# Patient Record
Sex: Female | Born: 1955 | Race: Black or African American | Hispanic: No | Marital: Married | Smoking: Light tobacco smoker
Health system: Southern US, Community
[De-identification: ages and names within clinical notes are randomized; demographics above are authoritative.]

## PROBLEM LIST (undated history)

## (undated) DIAGNOSIS — E559 Vitamin D deficiency, unspecified: Secondary | ICD-10-CM

## (undated) DIAGNOSIS — C801 Malignant (primary) neoplasm, unspecified: Secondary | ICD-10-CM

## (undated) DIAGNOSIS — K219 Gastro-esophageal reflux disease without esophagitis: Secondary | ICD-10-CM

## (undated) DIAGNOSIS — G43909 Migraine, unspecified, not intractable, without status migrainosus: Secondary | ICD-10-CM

## (undated) DIAGNOSIS — F419 Anxiety disorder, unspecified: Secondary | ICD-10-CM

## (undated) DIAGNOSIS — E785 Hyperlipidemia, unspecified: Secondary | ICD-10-CM

## (undated) DIAGNOSIS — H47333 Pseudopapilledema of optic disc, bilateral: Secondary | ICD-10-CM

## (undated) DIAGNOSIS — I10 Essential (primary) hypertension: Secondary | ICD-10-CM

## (undated) DIAGNOSIS — Z85850 Personal history of malignant neoplasm of thyroid: Secondary | ICD-10-CM

## (undated) HISTORY — DX: Essential (primary) hypertension: I10

## (undated) HISTORY — DX: Gastro-esophageal reflux disease without esophagitis: K21.9

## (undated) HISTORY — DX: Personal history of malignant neoplasm of thyroid: Z85.850

## (undated) HISTORY — DX: Vitamin D deficiency, unspecified: E55.9

## (undated) HISTORY — DX: Hyperlipidemia, unspecified: E78.5

## (undated) HISTORY — DX: Pseudopapilledema of optic disc, bilateral: H47.333

## (undated) HISTORY — DX: Migraine, unspecified, not intractable, without status migrainosus: G43.909

## (undated) HISTORY — DX: Anxiety disorder, unspecified: F41.9

## (undated) HISTORY — DX: Malignant (primary) neoplasm, unspecified: C80.1

---

## 1998-11-07 ENCOUNTER — Encounter: Payer: Self-pay | Admitting: Internal Medicine

## 1998-11-07 ENCOUNTER — Ambulatory Visit (HOSPITAL_COMMUNITY): Admission: RE | Admit: 1998-11-07 | Discharge: 1998-11-07 | Payer: Self-pay | Admitting: Internal Medicine

## 1998-12-18 ENCOUNTER — Other Ambulatory Visit: Admission: RE | Admit: 1998-12-18 | Discharge: 1998-12-18 | Payer: Self-pay | Admitting: Gynecology

## 1999-11-27 ENCOUNTER — Ambulatory Visit (HOSPITAL_COMMUNITY): Admission: RE | Admit: 1999-11-27 | Discharge: 1999-11-27 | Payer: Self-pay | Admitting: Internal Medicine

## 1999-11-27 ENCOUNTER — Encounter: Payer: Self-pay | Admitting: Internal Medicine

## 1999-11-30 ENCOUNTER — Other Ambulatory Visit: Admission: RE | Admit: 1999-11-30 | Discharge: 1999-11-30 | Payer: Self-pay | Admitting: Gynecology

## 2000-11-18 ENCOUNTER — Ambulatory Visit (HOSPITAL_COMMUNITY): Admission: RE | Admit: 2000-11-18 | Discharge: 2000-11-18 | Payer: Self-pay | Admitting: Internal Medicine

## 2000-12-23 ENCOUNTER — Ambulatory Visit (HOSPITAL_COMMUNITY): Admission: RE | Admit: 2000-12-23 | Discharge: 2000-12-23 | Payer: Self-pay | Admitting: Internal Medicine

## 2000-12-23 ENCOUNTER — Encounter: Payer: Self-pay | Admitting: Internal Medicine

## 2001-02-09 ENCOUNTER — Other Ambulatory Visit: Admission: RE | Admit: 2001-02-09 | Discharge: 2001-02-09 | Payer: Self-pay | Admitting: Gynecology

## 2001-12-29 ENCOUNTER — Encounter: Payer: Self-pay | Admitting: Gynecology

## 2001-12-29 ENCOUNTER — Ambulatory Visit (HOSPITAL_COMMUNITY): Admission: RE | Admit: 2001-12-29 | Discharge: 2001-12-29 | Payer: Self-pay | Admitting: Gynecology

## 2002-01-19 ENCOUNTER — Other Ambulatory Visit: Admission: RE | Admit: 2002-01-19 | Discharge: 2002-01-19 | Payer: Self-pay | Admitting: Gynecology

## 2003-01-05 ENCOUNTER — Ambulatory Visit (HOSPITAL_COMMUNITY): Admission: RE | Admit: 2003-01-05 | Discharge: 2003-01-05 | Payer: Self-pay | Admitting: Gynecology

## 2003-01-05 ENCOUNTER — Encounter: Payer: Self-pay | Admitting: Gynecology

## 2003-02-08 ENCOUNTER — Other Ambulatory Visit: Admission: RE | Admit: 2003-02-08 | Discharge: 2003-02-08 | Payer: Self-pay | Admitting: Gynecology

## 2003-03-28 ENCOUNTER — Ambulatory Visit: Admission: RE | Admit: 2003-03-28 | Discharge: 2003-03-28 | Payer: Self-pay | Admitting: Internal Medicine

## 2003-03-28 ENCOUNTER — Encounter: Payer: Self-pay | Admitting: Internal Medicine

## 2003-07-16 HISTORY — PX: ABDOMINAL HYSTERECTOMY: SHX81

## 2004-01-03 ENCOUNTER — Inpatient Hospital Stay (HOSPITAL_COMMUNITY): Admission: RE | Admit: 2004-01-03 | Discharge: 2004-01-05 | Payer: Self-pay | Admitting: Gynecology

## 2004-01-03 ENCOUNTER — Encounter (INDEPENDENT_AMBULATORY_CARE_PROVIDER_SITE_OTHER): Payer: Self-pay | Admitting: Specialist

## 2004-06-06 ENCOUNTER — Ambulatory Visit (HOSPITAL_COMMUNITY): Admission: RE | Admit: 2004-06-06 | Discharge: 2004-06-06 | Payer: Self-pay | Admitting: Internal Medicine

## 2004-08-22 ENCOUNTER — Other Ambulatory Visit: Admission: RE | Admit: 2004-08-22 | Discharge: 2004-08-22 | Payer: Self-pay | Admitting: Gynecology

## 2005-08-06 ENCOUNTER — Ambulatory Visit (HOSPITAL_COMMUNITY): Admission: RE | Admit: 2005-08-06 | Discharge: 2005-08-06 | Payer: Self-pay | Admitting: Gynecology

## 2005-09-18 ENCOUNTER — Other Ambulatory Visit: Admission: RE | Admit: 2005-09-18 | Discharge: 2005-09-18 | Payer: Self-pay | Admitting: Gynecology

## 2006-09-01 ENCOUNTER — Ambulatory Visit (HOSPITAL_COMMUNITY): Admission: RE | Admit: 2006-09-01 | Discharge: 2006-09-01 | Payer: Self-pay | Admitting: Gynecology

## 2006-09-17 ENCOUNTER — Encounter: Admission: RE | Admit: 2006-09-17 | Discharge: 2006-09-17 | Payer: Self-pay | Admitting: Gynecology

## 2006-09-24 ENCOUNTER — Other Ambulatory Visit: Admission: RE | Admit: 2006-09-24 | Discharge: 2006-09-24 | Payer: Self-pay | Admitting: Gynecology

## 2007-08-21 ENCOUNTER — Ambulatory Visit (HOSPITAL_COMMUNITY): Admission: RE | Admit: 2007-08-21 | Discharge: 2007-08-21 | Payer: Self-pay | Admitting: Gastroenterology

## 2007-08-21 LAB — HM COLONOSCOPY

## 2007-10-14 ENCOUNTER — Encounter: Admission: RE | Admit: 2007-10-14 | Discharge: 2007-10-14 | Payer: Self-pay | Admitting: Gynecology

## 2008-03-02 ENCOUNTER — Emergency Department (HOSPITAL_COMMUNITY): Admission: EM | Admit: 2008-03-02 | Discharge: 2008-03-02 | Payer: Self-pay | Admitting: Family Medicine

## 2008-12-21 ENCOUNTER — Encounter: Admission: RE | Admit: 2008-12-21 | Discharge: 2008-12-21 | Payer: Self-pay | Admitting: Internal Medicine

## 2008-12-27 ENCOUNTER — Encounter: Admission: RE | Admit: 2008-12-27 | Discharge: 2008-12-27 | Payer: Self-pay | Admitting: Internal Medicine

## 2009-05-31 ENCOUNTER — Ambulatory Visit (HOSPITAL_COMMUNITY): Admission: RE | Admit: 2009-05-31 | Discharge: 2009-05-31 | Payer: Self-pay | Admitting: Internal Medicine

## 2009-12-22 ENCOUNTER — Encounter: Admission: RE | Admit: 2009-12-22 | Discharge: 2009-12-22 | Payer: Self-pay | Admitting: Internal Medicine

## 2010-02-03 ENCOUNTER — Emergency Department (HOSPITAL_COMMUNITY): Admission: EM | Admit: 2010-02-03 | Discharge: 2010-02-03 | Payer: Self-pay | Admitting: Family Medicine

## 2010-08-05 ENCOUNTER — Encounter: Payer: Self-pay | Admitting: Gynecology

## 2010-08-06 ENCOUNTER — Encounter: Payer: Self-pay | Admitting: Internal Medicine

## 2010-08-27 ENCOUNTER — Other Ambulatory Visit (HOSPITAL_COMMUNITY): Payer: Self-pay | Admitting: Internal Medicine

## 2010-08-27 DIAGNOSIS — E049 Nontoxic goiter, unspecified: Secondary | ICD-10-CM

## 2010-08-30 ENCOUNTER — Other Ambulatory Visit (HOSPITAL_COMMUNITY): Payer: Self-pay | Admitting: Internal Medicine

## 2010-08-30 ENCOUNTER — Ambulatory Visit (HOSPITAL_COMMUNITY)
Admission: RE | Admit: 2010-08-30 | Discharge: 2010-08-30 | Disposition: A | Discharge status: Home / Self Care | Payer: Commercial Managed Care - PPO | Source: Ambulatory Visit | Attending: Internal Medicine | Admitting: Internal Medicine

## 2010-08-30 DIAGNOSIS — E049 Nontoxic goiter, unspecified: Secondary | ICD-10-CM

## 2010-08-30 DIAGNOSIS — E042 Nontoxic multinodular goiter: Secondary | ICD-10-CM | POA: Insufficient documentation

## 2010-09-05 ENCOUNTER — Other Ambulatory Visit (HOSPITAL_COMMUNITY): Payer: Self-pay | Admitting: Internal Medicine

## 2010-09-05 DIAGNOSIS — E041 Nontoxic single thyroid nodule: Secondary | ICD-10-CM

## 2010-09-10 ENCOUNTER — Ambulatory Visit (HOSPITAL_COMMUNITY)
Admission: RE | Admit: 2010-09-10 | Discharge: 2010-09-10 | Disposition: A | Discharge status: Home / Self Care | Payer: 59 | Source: Ambulatory Visit | Attending: Internal Medicine | Admitting: Internal Medicine

## 2010-09-10 ENCOUNTER — Other Ambulatory Visit: Payer: Self-pay | Admitting: Interventional Radiology

## 2010-09-10 DIAGNOSIS — E039 Hypothyroidism, unspecified: Secondary | ICD-10-CM | POA: Insufficient documentation

## 2010-09-10 DIAGNOSIS — E042 Nontoxic multinodular goiter: Secondary | ICD-10-CM | POA: Insufficient documentation

## 2010-09-10 DIAGNOSIS — E041 Nontoxic single thyroid nodule: Secondary | ICD-10-CM

## 2010-09-11 ENCOUNTER — Other Ambulatory Visit (HOSPITAL_COMMUNITY): Payer: Self-pay | Admitting: Orthopedic Surgery

## 2010-09-11 DIAGNOSIS — M25561 Pain in right knee: Secondary | ICD-10-CM

## 2010-09-18 ENCOUNTER — Ambulatory Visit (HOSPITAL_COMMUNITY)
Admission: RE | Admit: 2010-09-18 | Discharge: 2010-09-18 | Disposition: A | Discharge status: Home / Self Care | Payer: 59 | Source: Ambulatory Visit | Attending: Orthopedic Surgery | Admitting: Orthopedic Surgery

## 2010-09-18 DIAGNOSIS — M959 Acquired deformity of musculoskeletal system, unspecified: Secondary | ICD-10-CM | POA: Insufficient documentation

## 2010-09-18 DIAGNOSIS — M25561 Pain in right knee: Secondary | ICD-10-CM

## 2010-09-18 DIAGNOSIS — M25569 Pain in unspecified knee: Secondary | ICD-10-CM | POA: Insufficient documentation

## 2010-09-18 DIAGNOSIS — M7989 Other specified soft tissue disorders: Secondary | ICD-10-CM | POA: Insufficient documentation

## 2010-09-29 LAB — POCT URINALYSIS DIP (DEVICE)
Bilirubin Urine: NEGATIVE
Glucose, UA: NEGATIVE mg/dL
Ketones, ur: NEGATIVE mg/dL
Nitrite: NEGATIVE
Specific Gravity, Urine: 1.01 (ref 1.005–1.030)

## 2010-09-29 LAB — URINE CULTURE: Colony Count: 8000

## 2010-10-08 ENCOUNTER — Other Ambulatory Visit: Payer: Self-pay | Admitting: Gynecology

## 2010-10-24 ENCOUNTER — Other Ambulatory Visit: Payer: Self-pay | Admitting: Surgery

## 2010-10-24 DIAGNOSIS — E041 Nontoxic single thyroid nodule: Secondary | ICD-10-CM

## 2010-11-27 NOTE — Op Note (Signed)
NAME:  Tammy Duran, Tammy Duran NO.:  [id number]   MEDICAL RECORD NO.:  [id number]          PATIENT TYPE:  AMB   LOCATION:  ENDO                         FACILITY:  [facility]   PHYSICIAN:  Anselmo Rod, M.D.  DATE OF BIRTH:  01-Oct-1955   DATE OF PROCEDURE:  08/21/2007  DATE OF DISCHARGE:                               OPERATIVE REPORT   PROCEDURE PERFORMED:  Screening colonoscopy.   ENDOSCOPIST:  Anselmo Rod, M.D.   INSTRUMENT USED:  Pentax video colonoscope.   INDICATIONS FOR PROCEDURE:  A 55 year old, African-American female  undergoing a screening colonoscopy to rule out colonic polyps, masses,  etc.   PREPROCEDURE PREPARATION:  Informed consent was procured from the  patient.  The patient fasted for 4 hours prior to the procedure and  prepped with 20 OsmoPrep pills the night of and 12 OsmoPrep pills the  morning of the procedure. The risks and benefits of the procedure  including a 10% miss rate of cancer and polyp were discussed with the  patient as well.   PREPROCEDURE PHYSICAL:  The patient had stable vital signs.  Neck  supple.  Chest clear to auscultation.  S1-S2 regular.  Abdomen soft with  normal bowel sounds.   DESCRIPTION OF PROCEDURE:  The patient was placed in the left lateral  decubitus position, sedated with 580 mcg of Fentanyl and 4 mg of Versed  given intravenously in slow incremental doses.  Once the patient was  adequately sedated and maintained on low-flow oxygen and continuous  cardiac monitoring the Pentax video colonoscope was advanced from the  rectum to the cecum.  The appendiceal orifice and ileocecal valve were  clearly visualized and photographed.  The terminal ileum appeared  healthy and without lesions. No masses, polyps, erosions, ulcerations or  diverticula seen.  Retroflexion in the rectum revealed no abnormalities.  The patient tolerated the procedure well without immediate  complications.   IMPRESSION:  Normal colonoscopy up  to the terminal ileum.  No masses,  polyps, erosions, ulcerations or diverticula noted.   RECOMMENDATIONS:  1. A repeat colonoscopy has been recommended in the next 10 years. If      the patient has any abnormal symptoms in the interim, she should      contact the office immediately for further recommendations.  2. Continue high fiber diet with liberal fluid intake.  3. Yearly Hemoccult testing.  4. Outpatient follow-up as need arises in the future.      Anselmo Rod, M.D.  Electronically Signed     JNM/MEDQ  D:  08/21/2007  T:  08/22/2007  Job:  [id number]   cc:   Lucky Cowboy, M.D.  Fax: [phone]

## 2010-11-30 NOTE — Discharge Summary (Signed)
NAME:  Tammy Duran, Tammy Duran                           ACCOUNT NO.:  [id number]   MEDICAL RECORD NO.:  [id number]                   PATIENT TYPE:  INP   LOCATION:  9309                                 FACILITY:  [facility]   PHYSICIAN:  Gretta Cool, M.D.              DATE OF BIRTH:  [birth year]   DATE OF ADMISSION:  01/03/2004  DATE OF DISCHARGE:  01/05/2004                                 DISCHARGE SUMMARY   HISTORY OF PRESENT ILLNESS:  Ms. Son is a 55 year old female, gravida 3,  AB 2, para 1, with a history of uterine leiomyomata and abnormal uterine  bleeding for greater than 6 months.  She has heavy flow with passing of  large clots, backache, and difficulty with bladder compression, nocturia,  frequency, and urgency.  Ultrasound documented fibroids, and they are not  accessible by hysteroscopy.  She is now admitted for definitive therapy by  supracervical hysterectomy, possible total abdominal hysterectomy, possible  salpingo-oophorectomy under general anesthesia.  She wishes ovarian  conservation if possible.  It is also noted that she has mitral valve  prolapse and required dental and GYN prophylaxis.   ADMISSION EXAMINATION:  CHEST:  Clear to auscultation and percussion.  HEART:  Rate and rhythm were regular without murmur, gallop, or cardiac  enlargement.  ABDOMEN:  Soft and scaphoid without masses or organomegaly.  Uterus is  palpable above the symphysis pubis.  PELVIC:  External genitalia within normal limits for female.  Vagina clean  and rugose.  Cervix is parous and well supported.  The uterus is  approximately 14 weeks in size.  Adnexa non-palpable.  RECTOVAGINAL EXAM:  Confirms.   IMPRESSION:  1. Uterine leiomyomata with abnormal uterine bleeding and pelvic pressure.  2. Mitral valve prolapse by history requiring prophylaxis.  3. Hypertension, well controlled.  4. Anxiety.   PLAN:  Definitive therapy as mentioned above.  Risks and benefits were  discussed with the  patient, and she accepts these procedures.   LABORATORY DATA:  Admission hemoglobin 13.8, hematocrit 41.9, white count of  6.8.  On the first postoperative day, hemoglobin was 12.8, hematocrit 38.6.  The remainder of her preoperative laboratory work was within normal limits.  Urine pregnancy test was negative.  EKG revealed normal sinus rhythm.  Normal EKG.   HOSPITAL COURSE:  The patient underwent supracervical hysterectomy under  general anesthesia.  The patient was given prophylactic antibiotics for  mitral valve prolapse prior to surgery.  Examination of the pelvis revealed  fibroids grossly distorting the uterus.  The ovaries appeared to be  relatively inactive, but the patient desires conservation.  There is no  evidence of endometriosis or pathology that contraindicated leaving the  ovaries in place.  The procedure was completed without any complications,  and the patient was returned to the recovery room in excellent condition.   Her postoperative course was without complications, and she was discharged  on the  second postoperative day in excellent condition.   FINAL DISCHARGE INSTRUCTIONS:  1. No heavy lifting or straining.  2. No vaginal entrance.  3. Increase ambulation as tolerated.  4. She is to report any fever of over 100.5 or failure of daily improvement.   DIET:  Regular.   MEDICATIONS:  1. Tylox one p.o. q.4-6h. p.r.n. discomfort.  2. She is to return to preoperative medications.   FOLLOW UP:  She is to return to the office in 1 week for follow up.   CONDITION ON DISCHARGE:  Excellent.   FINAL DISCHARGE DIAGNOSIS:  Uterine leiomyomata, transmural, with abnormal  uterine bleeding and pelvic pressures.   PROCEDURES PERFORMED:  Supracervical hysterectomy under general anesthesia.     Matt Holmes, N.P.                          Gretta Cool, M.D.    EMK/MEDQ  D:  01/17/2004  T:  01/18/2004  Job:  [id number]   cc:   Jeoffrey Massed, M.D.  [address]   [place]  [place] [address]  Fax: [phone]

## 2010-11-30 NOTE — H&P (Signed)
NAME:  Tammy Duran, Tammy Duran NO.:  [id number]   MEDICAL RECORD NO.:  [id number]                   PATIENT TYPE:   LOCATION:                                       FACILITY:   PHYSICIAN:  Gretta Cool, M.D.              DATE OF BIRTH:   DATE OF ADMISSION:  DATE OF DISCHARGE:                                HISTORY & PHYSICAL   PREOPERATIVE DIAGNOSES:  Uterine leiomyomata with abnormal uterine bleeding.   HISTORY OF PRESENT ILLNESS:  A 55 year old, G3, P1, AB 2, with a history of  uterine leiomyomata and abnormal uterine bleeding for greater than 6 months'  duration.  She has extremely heavy flow with passage of large clots, back  ache, and also has difficulty with bladder compression, nocturia, frequency,  urgency.  She has had ultrasound documentation and is not amenable to  consistent therapy such as hysteroscopy.  She is now admitted for definitive  therapy by supracervical hysterectomy, possible total abdominal  hysterectomy, possible salpingo-oophorectomy.  She wishes ovarian  conservation if possible, and that is our plan.  She understands the risks,  benefits, and alternative therapies all.   She also has mitral valve prolapse and requires dental and gyn prophylaxis,  so she will have amoxicillin and gentamicin preoperatively.   PAST MEDICAL HISTORY:  Usual childhood disease without sequelae.   MEDICAL ILLNESSES:  1. Blood pressure elevation on Moduretic 5/50 and atenolol 25 mg daily.  2. No other known significant medical concerns other than mitral valve     prolapse.   FAMILY HISTORY:  Father died of suicide.  Mother is 49 with diabetes  mellitus, type 2, heart problems, peripheral vascular disease, hypertension,  and hypercholesterolemia.  One sister is living and well.  One brother, age  90, has hypertension.  No other known familial tendencies.   SOCIAL HISTORY:  The patient is an Charity fundraiser, [employer].  She  is divorced with  one 102 year old child.   REVIEW OF SYSTEMS:  HEENT:  Denies symptoms.  CARDIORESPIRATORY:  Denies  asthma, cough, bronchitis, shortness of breath.  GI/GU:  Denies frequency,  urgency, dysuria, change in bowel habits, food intolerance.   HABITS:  Smokes 1/2 pack per week.  Social ethanol.  Denies other  recreational drugs or other.   PRESENT MEDICATIONS:  1. Moduretic 5/50.  2. Atenolol 25 mg daily.  3. Xanax 0.25 p.r.n. anxiety.  4. Zyrtec D and Flonase for seasonal allergy.   PHYSICAL EXAMINATION:  GENERAL:  Well-developed, well-nourished, black  female.  HEENT:  Pupils equal, reactive to light and accommodate.  Fundi not  examined.  Oropharynx clear.  NECK:  Supple without masses or thyroid enlargement.  HEART:  Regular rhythm without murmur or cardiac enlargement that I can  identify.  BREASTS:  Without mass, nodes, nipple discharge.  ABDOMEN:  Soft, scaphoid, without mass or organomegaly.  Uterus is palpable  above the symphysis.  PELVIC:  External genitalia.  Normal female vagina.  Clean, rugose.  Cervix  is parous, well-supported, does not distend significantly at all.  Her  uterus is approximately 14 weeks size.  Adnexa nonpalpable.  Rectovaginal  exam confirms.   IMPRESSION:  1. Uterine leiomyomata with abnormal uterine bleeding and pelvic pressure.  2. Mitral valve prolapse by history requiring prophylaxis.  3. Hypertension, well-controlled.  4. Anxiety.   PLAN:  On to definitive therapy by supracervical hysterectomy, possible  salpingo-oophorectomy versus TAH/BSO.  Risks, benefit, ratio, alternative  therapies all discussed in detail as above.                                               Gretta Cool, M.D.    CWL/MEDQ  D:  01/02/2004  T:  01/02/2004  Job:  [id number]   cc:   Lucky Cowboy, M.D.  [address]  [place], [place] [address]  Fax: [phone]

## 2010-11-30 NOTE — Op Note (Signed)
NAME:  Tammy Duran, Tammy Duran                           ACCOUNT NO.:  [id number]   MEDICAL RECORD NO.:  [id number]                   PATIENT TYPE:  INP   LOCATION:  9309                                 FACILITY:  [facility]   PHYSICIAN:  Gretta Cool, M.D.              DATE OF BIRTH:  10-04-1955   DATE OF PROCEDURE:  DATE OF DISCHARGE:                                 OPERATIVE REPORT   PREOPERATIVE DIAGNOSIS:  Uterine leiomyomata, transmural with abnormal  uterine bleeding.   POSTOPERATIVE DIAGNOSIS:  Uterine leiomyomata, transmural with abnormal  uterine bleeding.   SURGEON:  Beather Arbour, MD.   ASSISTANT:  Katherine Mantle, MD.   PROCEDURE:  Supracervical hysterectomy.   ANESTHESIA:  General, orotracheal.   DESCRIPTION OF PROCEDURE:  Under excellent general anesthesia with the  patient prepped and draped in the supine position, with Foley catheter  draining her bladder, a Pfannenstiel incision was made and the incision  extended through the fascia.  The rectus muscles were separated in the  midline and peritoneum opened.  The abdomen was explored, there was no  evidence of abnormality in the upper abdomen.  The examination of the pelvis  revealed a fibroid grossly distorted by multiple fibroids.  Both ovaries  appeared to be relatively inactive, but the patient desires conservation.  There was no evidence of endometriosis or other pathology that  contraindicated leaving ovaries in place.  At this point the uterus was  elevated with the [facility] clamps and the round ligaments transected.  The  anterior leaf of the broad ligament was then opened and the ovarian ligament  clamped, cut, sutured and tied.  The pedicle was then doubly ligated with 0  Vicryl.  The uterine vessels were skeletonized, clamped, cut, sutured and  tied with 0 Vicryl.  The cervix was then incised in conical fashion so as to  remove most of the endocervical canal.  The tissue inside the endocervix was  then treated by  cautery.  The cervix was then closed with a running mattress  closure of 0 Vicryl.  At the end of the procedure the bleeding was well  controlled.  The pelvic floor was then irrigated, and then approximated with  a running suture of # 2-0 Monocryl.  At this point the abdominal peritoneum  was closed with a running suture of 0 Monocryl.  The rectus muscles were  then plicated in the midline with a running suture of 3-0 Vicryl and the  fascia was approximated with running suture of 0 Vicryl from each angle  of the midline, subcutaneous tissue approximated with interrupted sutures of  2-0 Vicryl, and the skin was closed with skin staples and Steri-Strips.  At  the end of the procedure sponge and lap counts were correct.  There were no  complications.  The patient returned to the recovery room in excellent  condition.  Gretta Cool, M.D.    CWL/MEDQ  D:  01/03/2004  T:  01/03/2004  Job:  [id number]   cc:   Jeoffrey Massed, M.D.  [address]  [place]  [place] [address]  Fax: [phone]

## 2010-12-18 ENCOUNTER — Other Ambulatory Visit (HOSPITAL_COMMUNITY): Payer: Self-pay | Admitting: Surgery

## 2010-12-18 DIAGNOSIS — E041 Nontoxic single thyroid nodule: Secondary | ICD-10-CM

## 2011-01-21 ENCOUNTER — Ambulatory Visit (HOSPITAL_COMMUNITY)
Admission: RE | Admit: 2011-01-21 | Discharge: 2011-01-21 | Disposition: A | Discharge status: Home / Self Care | Payer: 59 | Source: Ambulatory Visit | Attending: Surgery | Admitting: Surgery

## 2011-01-21 ENCOUNTER — Other Ambulatory Visit: Payer: Commercial Managed Care - PPO

## 2011-01-21 DIAGNOSIS — E042 Nontoxic multinodular goiter: Secondary | ICD-10-CM | POA: Insufficient documentation

## 2011-01-21 DIAGNOSIS — E041 Nontoxic single thyroid nodule: Secondary | ICD-10-CM

## 2011-01-25 ENCOUNTER — Telehealth (INDEPENDENT_AMBULATORY_CARE_PROVIDER_SITE_OTHER): Payer: Self-pay | Admitting: Surgery

## 2011-01-25 ENCOUNTER — Other Ambulatory Visit (HOSPITAL_COMMUNITY): Payer: Self-pay | Admitting: Internal Medicine

## 2011-01-25 DIAGNOSIS — Z1231 Encounter for screening mammogram for malignant neoplasm of breast: Secondary | ICD-10-CM

## 2011-01-31 ENCOUNTER — Telehealth (INDEPENDENT_AMBULATORY_CARE_PROVIDER_SITE_OTHER): Payer: Self-pay

## 2011-01-31 ENCOUNTER — Ambulatory Visit (HOSPITAL_COMMUNITY)
Admission: RE | Admit: 2011-01-31 | Discharge: 2011-01-31 | Disposition: A | Discharge status: Home / Self Care | Payer: 59 | Source: Ambulatory Visit | Attending: Internal Medicine | Admitting: Internal Medicine

## 2011-01-31 DIAGNOSIS — Z1231 Encounter for screening mammogram for malignant neoplasm of breast: Secondary | ICD-10-CM

## 2011-01-31 NOTE — Telephone Encounter (Signed)
I spoke with the pt today and gave her results from her thyroid US.  She has an appt with Dr. Gerrit Friends on 02/11/11.

## 2011-02-07 ENCOUNTER — Ambulatory Visit (HOSPITAL_COMMUNITY): Admission: RE | Admit: 2011-02-07 | Payer: 59 | Source: Ambulatory Visit

## 2011-02-07 ENCOUNTER — Telehealth (INDEPENDENT_AMBULATORY_CARE_PROVIDER_SITE_OTHER): Payer: Self-pay

## 2011-02-07 NOTE — Telephone Encounter (Signed)
Left message for patient to return call.

## 2011-02-11 ENCOUNTER — Encounter (INDEPENDENT_AMBULATORY_CARE_PROVIDER_SITE_OTHER): Payer: Self-pay | Admitting: Surgery

## 2011-02-11 ENCOUNTER — Ambulatory Visit (INDEPENDENT_AMBULATORY_CARE_PROVIDER_SITE_OTHER): Payer: Commercial Managed Care - PPO | Admitting: Surgery

## 2011-02-11 DIAGNOSIS — E042 Nontoxic multinodular goiter: Secondary | ICD-10-CM

## 2011-02-11 DIAGNOSIS — E039 Hypothyroidism, unspecified: Secondary | ICD-10-CM

## 2011-02-11 NOTE — Patient Instructions (Signed)
Call [facility] if you decide to proceed with surgery.  [phone]. TMG

## 2011-02-11 NOTE — Progress Notes (Signed)
HISTORY: Patient is a 55 year old black female who returns for followup of multinodular thyroid goiter. She had undergone a previous fine-needle aspiration biopsy in February. This was benign. At my request she underwent a followup thyroid ultrasound on July 9. This shows stable bilateral thyroid nodules without significant change.  Patient returns today for followup. She wishes to discuss the possibility of thyroidectomy.   PERTINENT REVIEW OF SYSTEMS: Patient notes occasional palpitations. She denies tremor. She does note mild to moderate compressive symptoms. She especially notes pressure sensation with extension of the neck or with tightfitting clothing.   EXAM: HEENT shows her to be normocephalic. Sclerae clear. Pupils reactive. Dentition good. Mucous membranes moist. Voice is normal. Neck shows an obvious asymmetry with the right lobe of the thyroid greater in size than the left. There is mild to moderate tenderness and discomfort with compression of the right thyroid lobe. There may be slight tracheal deviation to the left. There are multiple nodules bilaterally, greater on the right than on the left. There is no anterior or posterior cervical lymphadenopathy. There are no supraclavicular masses. Chest is clear to auscultation without rales rhonchi or wheezes Cardiac exam shows regular rate and rhythm without murmur Extremities are nontender without edema Neurologically the patient is alert and oriented without focal neurologic deficit. There is no sign of tremor.   IMPRESSION: #1-multinodular thyroid goiter with mild to moderate compressive symptoms #2 hypothyroidism on medication   PLAN: The patient and I discussed options for further management. Certainly there is no absolute indication for thyroidectomy at this time. She could continue with further observation with laboratory studies and sequential ultrasound scanning.  Another option would be to consider thyroidectomy. Her  primary care physician has encouraged her to proceed with thyroidectomy. Certainly this would improve her developing compressive symptoms and eliminate the need for further diagnostic testing and biopsies in the future.  Patient and I again discussed total thyroidectomy. We discussed risk and benefits. We discussed the hospital stay to be anticipated and her return to work. We discussed the need for lifelong thyroid hormone replacement. She understands and will contact our office if she desires to proceed with thyroidectomy area and otherwise we will plan to see her back in the office in one year.  The risks and benefits of the procedure have been discussed at length with the patient.  The patient understands the proposed procedure, potential alternative treatments, and the course of recovery to be expected.  All of the patient's questions have been answered at this time.  The patient wishes to proceed with surgery and will schedule a date for their procedure through our office staff.

## 2011-02-27 ENCOUNTER — Other Ambulatory Visit: Payer: Commercial Managed Care - PPO

## 2011-03-22 ENCOUNTER — Encounter (INDEPENDENT_AMBULATORY_CARE_PROVIDER_SITE_OTHER): Payer: Self-pay | Admitting: Surgery

## 2011-04-23 ENCOUNTER — Other Ambulatory Visit (INDEPENDENT_AMBULATORY_CARE_PROVIDER_SITE_OTHER): Payer: Self-pay | Admitting: Surgery

## 2011-04-23 ENCOUNTER — Ambulatory Visit (HOSPITAL_COMMUNITY)
Admission: RE | Admit: 2011-04-23 | Discharge: 2011-04-23 | Disposition: A | Discharge status: Home / Self Care | Payer: 59 | Source: Ambulatory Visit | Attending: Surgery | Admitting: Surgery

## 2011-04-23 ENCOUNTER — Encounter (HOSPITAL_COMMUNITY): Payer: 59

## 2011-04-23 DIAGNOSIS — Z01812 Encounter for preprocedural laboratory examination: Secondary | ICD-10-CM | POA: Insufficient documentation

## 2011-04-23 DIAGNOSIS — Z01818 Encounter for other preprocedural examination: Secondary | ICD-10-CM

## 2011-04-23 DIAGNOSIS — Z01811 Encounter for preprocedural respiratory examination: Secondary | ICD-10-CM | POA: Insufficient documentation

## 2011-04-23 LAB — BASIC METABOLIC PANEL
BUN: 7 mg/dL (ref 6–23)
Calcium: 9.8 mg/dL (ref 8.4–10.5)
Chloride: 99 mEq/L (ref 96–112)
Creatinine, Ser: 0.72 mg/dL (ref 0.50–1.10)
GFR calc Af Amer: >90 mL/min (ref >90)

## 2011-04-23 LAB — CBC
MCH: 32 pg (ref 26.0–34.0)
MCHC: 33.3 g/dL (ref 30.0–36.0)
Platelets: 288 10*3/uL (ref 150–400)
RBC: 4.38 MIL/uL (ref 3.87–5.11)

## 2011-04-23 LAB — URINALYSIS, ROUTINE W REFLEX MICROSCOPIC
Ketones, ur: NEGATIVE mg/dL
Leukocytes, UA: NEGATIVE
Nitrite: NEGATIVE
Protein, ur: NEGATIVE mg/dL
pH: 7.5 (ref 5.0–8.0)

## 2011-04-23 LAB — DIFFERENTIAL
Basophils Absolute: 0 10*3/uL (ref 0.0–0.1)
Basophils Relative: 0 % (ref 0–1)
Eosinophils Absolute: 0.1 10*3/uL (ref 0.0–0.7)
Monocytes Relative: 6 % (ref 3–12)
Neutrophils Relative %: 69 % (ref 43–77)

## 2011-04-23 NOTE — Progress Notes (Signed)
Quick Note:  These results are acceptable for scheduled surgery. TMG ______ 

## 2011-04-25 ENCOUNTER — Other Ambulatory Visit (INDEPENDENT_AMBULATORY_CARE_PROVIDER_SITE_OTHER): Payer: Self-pay | Admitting: Surgery

## 2011-04-25 ENCOUNTER — Ambulatory Visit (HOSPITAL_COMMUNITY)
Admission: RE | Admit: 2011-04-25 | Discharge: 2011-04-26 | Disposition: A | Discharge status: Home / Self Care | Payer: 59 | Source: Ambulatory Visit | Attending: Surgery | Admitting: Surgery

## 2011-04-25 DIAGNOSIS — F17200 Nicotine dependence, unspecified, uncomplicated: Secondary | ICD-10-CM | POA: Insufficient documentation

## 2011-04-25 DIAGNOSIS — E042 Nontoxic multinodular goiter: Secondary | ICD-10-CM | POA: Insufficient documentation

## 2011-04-25 DIAGNOSIS — Z79899 Other long term (current) drug therapy: Secondary | ICD-10-CM | POA: Insufficient documentation

## 2011-04-25 DIAGNOSIS — I10 Essential (primary) hypertension: Secondary | ICD-10-CM | POA: Insufficient documentation

## 2011-04-25 HISTORY — PX: THYROIDECTOMY: SHX17

## 2011-04-26 LAB — CALCIUM: Calcium: 9.1 mg/dL (ref 8.4–10.5)

## 2011-05-01 NOTE — Op Note (Signed)
NAMEMarland Kitchen  Tammy, Duran NO.:  [id number]  MEDICAL RECORD NO.:  [id number]  LOCATION:  1539                         FACILITY:  [facility]  PHYSICIAN:  Velora Heckler, MD      DATE OF BIRTH:  December 27, 1955  DATE OF PROCEDURE:  04/25/2011                               OPERATIVE REPORT   PREOPERATIVE DIAGNOSIS:  Multinodular goiter with compressive symptoms.  POSTOPERATIVE DIAGNOSIS:  Multinodular goiter with compressive symptoms.  PROCEDURE:  Total thyroidectomy.  SURGEON:  Velora Heckler, MD, FACS  ANESTHESIA:  General per Dr. Eilene Ghazi.  ESTIMATED BLOOD LOSS:  Minimal.  PREPARATION:  ChloraPrep.  COMPLICATIONS:  None.  INDICATIONS:  The patient is a 55 year old black female who has a longstanding multinodular thyroid goiter.  She has had gradual increase in the size of her goiter with development of mild compressive symptoms. She now comes to surgery for thyroidectomy.  BODY OF REPORT:  Procedure was done in OR #1 at the [facility].  The patient was brought to the operating room, placed in the supine position on the operating room table.  Following administration of general anesthesia, the patient was positioned and then prepped and draped in the usual strict aseptic fashion.  After ascertaining that an adequate level of anesthesia had been achieved, a Kocher incision was made with a #15 blade.  Dissection was carried through subcutaneous tissues and platysma.  Hemostasis was obtained with electrocautery.  Skin flaps were elevated cephalad and caudad from the thyroid notch to the sternal notch.  A Mahorner self-retaining retractor was placed for exposure.  Strap muscles were incised in the midline and dissection was begun on the left side.  Left thyroid lobe was slightly enlarged.  It was gently mobilized with blunt dissection.  Middle thyroid vein was divided between medium Ligaclips with a Harmonic Scalpel.  Superior pole was gently  dissected out and mobilized. Superior pole vessels were divided between Ligaclips with the Harmonic Scalpel.  Gland was rolled anteriorly.  Superior parathyroid gland was identified and preserved.  Recurrent laryngeal nerve was identified and preserved.  Branches of the inferior thyroid artery were divided between small Ligaclips with the Harmonic Scalpel.  Inferior venous tributaries were divided between medium Ligaclips with the Harmonic Scalpel. Ligament of Allyson Sabal was released with the electrocautery and the gland was mobilized up and onto the anterior trachea.  There was a small firm 4-mm nodule at the superior aspect of the isthmus which was resected with the isthmus.  No significant pyramidal lobe was identified.  Dry pack was placed in the left neck.  Next, we turned our attention to the right lobe.  Strap muscles were again reflected laterally.  Right lobe was moderately enlarged, larger than the left lobe.  It contains dominant nodules.  Venous tributaries were divided between Ligaclips with the Harmonic Scalpel.  Superior pole vessels were dissected out and divided individually between medium Ligaclips with the Harmonic Scalpel.  Superior and inferior parathyroid glands were identified and preserved.  Branches of the inferior thyroid artery were divided between small and medium Ligaclips.  Recurrent laryngeal nerve was identified and preserved.  Ligament of Allyson Sabal was released  with the electrocautery and the gland was mobilized onto the anterior trachea.  Remaining inferior venous tributaries on the right were divided between medium Ligaclips with the Harmonic Scalpel and the gland was completely excised.  Suture was used to mark the right superior pole.  The entire thyroid gland was submitted to Pathology for review.  Neck was irrigated with warm saline bilaterally.  Good hemostasis was noted.  Surgicel was placed throughout the operative field.  Strap muscles were  reapproximated in the midline with interrupted 3-0 Vicryl sutures.  Platysma was closed with interrupted 3-0 Vicryl sutures.  Skin was closed with running 4-0 Monocryl subcuticular suture.  Wound was washed and dried, and benzoin and Steri-Strips were applied.  Sterile dressings were applied.  The patient was awakened from anesthesia and brought to the recovery room.  The patient tolerated the procedure well.   Velora Heckler, MD, FACS     TMG/MEDQ  D:  04/25/2011  T:  04/25/2011  Job:  [id number]  cc:   Lucky Cowboy, M.D. Fax: [phone]  Electronically Signed by Darnell Level MD on 05/01/2011 11:48:11 AM

## 2011-05-06 ENCOUNTER — Encounter (INDEPENDENT_AMBULATORY_CARE_PROVIDER_SITE_OTHER): Payer: Self-pay

## 2011-05-06 ENCOUNTER — Telehealth (INDEPENDENT_AMBULATORY_CARE_PROVIDER_SITE_OTHER): Payer: Self-pay | Admitting: Surgery

## 2011-05-06 DIAGNOSIS — E042 Nontoxic multinodular goiter: Secondary | ICD-10-CM

## 2011-05-06 NOTE — Telephone Encounter (Signed)
Called path results to patient.  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  TMG

## 2011-05-16 ENCOUNTER — Other Ambulatory Visit (INDEPENDENT_AMBULATORY_CARE_PROVIDER_SITE_OTHER): Payer: Self-pay | Admitting: Surgery

## 2011-05-17 ENCOUNTER — Encounter (INDEPENDENT_AMBULATORY_CARE_PROVIDER_SITE_OTHER): Payer: Self-pay | Admitting: Surgery

## 2011-05-17 LAB — THYROID PANEL WITH TSH: T3 Uptake: 34 % (ref 22.5–37.0)

## 2011-05-20 ENCOUNTER — Encounter (INDEPENDENT_AMBULATORY_CARE_PROVIDER_SITE_OTHER): Payer: Self-pay | Admitting: Surgery

## 2011-05-20 ENCOUNTER — Ambulatory Visit (INDEPENDENT_AMBULATORY_CARE_PROVIDER_SITE_OTHER): Payer: 59 | Admitting: Surgery

## 2011-05-20 ENCOUNTER — Telehealth (INDEPENDENT_AMBULATORY_CARE_PROVIDER_SITE_OTHER): Payer: Self-pay | Admitting: General Surgery

## 2011-05-20 VITALS — BP 134/80 | HR 72 | Temp 96.9°F | Resp 16 | Ht 63.5 in | Wt 159.4 lb

## 2011-05-20 DIAGNOSIS — C73 Malignant neoplasm of thyroid gland: Secondary | ICD-10-CM

## 2011-05-20 HISTORY — DX: Malignant neoplasm of thyroid gland: C73

## 2011-05-20 MED ORDER — LEVOTHROID 50 MCG PO TABS: 75.0000 ug | ORAL_TABLET | Freq: Every day | ORAL | Status: DC

## 2011-05-20 MED ORDER — SYNTHROID 75 MCG PO TABS: 75.0000 ug | ORAL_TABLET | Freq: Every day | ORAL | Status: DC

## 2011-05-20 NOTE — Progress Notes (Signed)
Visit Diagnoses: 1. Thyroid cancer, micropapillary, pT1a, pNx     HISTORY: Patient returns for her first postoperative visit having undergone total thyroidectomy for multinodular thyroid goiter. Incidental finding of a less than 1 mm micropapillary carcinoma. Follow up serum calcium level is normal at 9.4. Patient has increased her own dosage of thyroid hormone due to fatigue. She is now taking 1-1/2 tablets for a total of 75 mcg daily. Recent TSH level was slightly elevated at 4.503.   EXAM: Wound is well-healed. Mild soft tissue swelling. No sign of infection. Versus normal at conversational level.   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 I have asked her to remain on this dose for 6 weeks. We will check a TSH level at that time. Patient will return to see me in 6 weeks for wound check.   Velora Heckler, MD, FACS General & Endocrine Surgery [facility]

## 2011-05-20 NOTE — Telephone Encounter (Signed)
Called and corrected.

## 2011-05-20 NOTE — Telephone Encounter (Signed)
Rec'd call from [facility] requesting clarification on three scripts they received regarding this patient. Call back # [phone].

## 2011-05-20 NOTE — Patient Instructions (Signed)
  COCOA BUTTER & VITAMIN E CREAM  ([employer] or other brand)  Apply cocoa butter/vitamin E cream to your incision 2 - 3 times daily.  Massage cream into incision for one minute with each application.  Use sunscreen (50 SPF or higher) for first 6 months after surgery.  You may substitute Mederma or other scar reducing creams as desired.   

## 2011-07-08 ENCOUNTER — Other Ambulatory Visit (INDEPENDENT_AMBULATORY_CARE_PROVIDER_SITE_OTHER): Payer: Self-pay | Admitting: Surgery

## 2011-07-08 LAB — TSH: TSH: 2.76 u[IU]/mL (ref 0.350–4.500)

## 2011-07-10 ENCOUNTER — Ambulatory Visit (INDEPENDENT_AMBULATORY_CARE_PROVIDER_SITE_OTHER): Payer: Commercial Managed Care - PPO | Admitting: Surgery

## 2011-07-10 ENCOUNTER — Encounter (INDEPENDENT_AMBULATORY_CARE_PROVIDER_SITE_OTHER): Payer: Self-pay | Admitting: Surgery

## 2011-07-10 VITALS — BP 120/76 | HR 72 | Temp 98.0°F | Resp 16 | Ht 63.5 in | Wt 162.4 lb

## 2011-07-10 DIAGNOSIS — C73 Malignant neoplasm of thyroid gland: Secondary | ICD-10-CM

## 2011-07-10 NOTE — Patient Instructions (Signed)
  COCOA BUTTER & VITAMIN E CREAM  ([employer] or other brand)  Apply cocoa butter/vitamin E cream to your incision 2 - 3 times daily.  Massage cream into incision for one minute with each application.  Use sunscreen (50 SPF or higher) for first 6 months after surgery.  You may substitute Mederma or other scar reducing creams as desired.   

## 2011-07-10 NOTE — Progress Notes (Signed)
Visit Diagnoses: 1. Thyroid cancer, micropapillary, pT1a, pNx    HISTORY: The patient returns for her scheduled followup. She is over 2 months out from total thyroidectomy. She had an incidental finding of a micropapillary carcinoma which requires no further treatment. She is currently taking 88 mcg of Synthroid daily. Her TSH level this week is normal at 2.76.  EXAM: Slight soft tissue edema in the upper flap of the incision. No seroma. Voice quality is normal at conversational level.  IMPRESSION: Status post total thyroidectomy with incidental finding of micropapillary carcinoma  PLAN: Patient is doing well overall. She is not yet happy with her voice quality. She also notes difficulty in losing weight. She also notes pain in her hair. She is taking vitamin supplements.  I think she would benefit from a slightly higher dose of Synthroid. I would recommend moving her to 100 mcg daily at her next visit with her primary care physician. My goal would be to keep her TSH level between 1.0 and 2.0.  Patient will return to see me for followup in 4 months.  Velora Heckler, MD, FACS General & Endocrine Surgery [facility]

## 2011-10-09 ENCOUNTER — Other Ambulatory Visit: Payer: Self-pay | Admitting: Gynecology

## 2011-11-04 ENCOUNTER — Encounter (INDEPENDENT_AMBULATORY_CARE_PROVIDER_SITE_OTHER): Payer: Self-pay | Admitting: Surgery

## 2011-11-04 ENCOUNTER — Ambulatory Visit (INDEPENDENT_AMBULATORY_CARE_PROVIDER_SITE_OTHER): Payer: Commercial Managed Care - PPO | Admitting: Surgery

## 2011-11-04 VITALS — BP 128/68 | HR 68 | Temp 97.2°F | Resp 16 | Ht 63.5 in | Wt 163.4 lb

## 2011-11-04 DIAGNOSIS — C73 Malignant neoplasm of thyroid gland: Secondary | ICD-10-CM

## 2011-11-04 DIAGNOSIS — E039 Hypothyroidism, unspecified: Secondary | ICD-10-CM

## 2011-11-04 NOTE — Patient Instructions (Signed)
Will call with referral information for Dr. Viann Shove at [facility]. Velora Heckler, MD, [facility] Office: [phone]

## 2011-11-04 NOTE — Progress Notes (Signed)
Visit Diagnoses: 1. Hypothyroidism   2. Thyroid cancer, micropapillary, pT1a, pNx     HISTORY: Patient returns for her scheduled followup. Recent TSH level is normal at 2.160 on her current dose of Synthroid 88 mcg daily.  Patient continues to complain of changes in the overall quality of her voice. We discussed this today at length.  PERTINENT REVIEW OF SYSTEMS: Denies dysphagia. Denies tremor. Denies palpitation. Denies shortness of breath. Unable to sustain at a level consistent with her preoperative performance  EXAM: HEENT: normocephalic; pupils equal and reactive; sclerae clear; dentition good; mucous membranes moist NECK:  symmetric on extension; no palpable anterior or posterior cervical lymphadenopathy; no supraclavicular masses; no tenderness; incision well healed CHEST: clear to auscultation bilaterally without rales, rhonchi, or wheezes CARDIAC: regular rate and rhythm without significant murmur; peripheral pulses are full EXT:  non-tender without edema; no deformity NEURO: no gross focal deficits; no sign of tremor   IMPRESSION: #1 status post thyroidectomy for micropapillary thyroid carcinoma #2 postoperative penetration into his quality #3 surgical hypothyroidism  PLAN: The patient and I discussed all of the above issues. I am in agreement with her gynecologist that her Synthroid dosage should be increased slightly in order to suppress her TSH level to less than 1.0. I will leave this up to her primary care physician.  As to her voice quality, she sounds essentially normal at conversation a level. Her exercise tolerance is good. There is no sign of stridor. The patient does smoke. She also has problems with reflux. Both of these issues could be impacting her overall voice quality. Certainly she has some postoperative changes in voice quality.  The patient would like to have her voice quality assessed. I'm going to make a referral to [facility]. They have a specialty clinic there. We will make arrangements for this consultation.  Patient will return to see me for thyroid cancer follow up in 6 months.  Velora Heckler, MD, FACS General & Endocrine Surgery [facility]

## 2011-11-15 ENCOUNTER — Telehealth (INDEPENDENT_AMBULATORY_CARE_PROVIDER_SITE_OTHER): Payer: Self-pay

## 2011-11-15 ENCOUNTER — Other Ambulatory Visit (INDEPENDENT_AMBULATORY_CARE_PROVIDER_SITE_OTHER): Payer: Self-pay

## 2011-11-15 DIAGNOSIS — R499 Unspecified voice and resonance disorder: Secondary | ICD-10-CM

## 2011-11-15 NOTE — Telephone Encounter (Signed)
Pt called requesting we proceed with ref to Dr Viann Shove at [facility]. Referral made, records faxed and pt notified of date.

## 2011-12-17 DIAGNOSIS — K219 Gastro-esophageal reflux disease without esophagitis: Secondary | ICD-10-CM | POA: Insufficient documentation

## 2012-01-22 ENCOUNTER — Encounter (INDEPENDENT_AMBULATORY_CARE_PROVIDER_SITE_OTHER): Payer: Self-pay

## 2012-03-20 ENCOUNTER — Encounter (INDEPENDENT_AMBULATORY_CARE_PROVIDER_SITE_OTHER): Payer: Self-pay

## 2012-03-20 ENCOUNTER — Telehealth (INDEPENDENT_AMBULATORY_CARE_PROVIDER_SITE_OTHER): Payer: Self-pay

## 2012-03-20 NOTE — Telephone Encounter (Signed)
Recall letter mailed to pt for appt. Exam only.

## 2012-03-25 ENCOUNTER — Other Ambulatory Visit (HOSPITAL_COMMUNITY): Payer: Self-pay | Admitting: Internal Medicine

## 2012-03-25 DIAGNOSIS — Z1231 Encounter for screening mammogram for malignant neoplasm of breast: Secondary | ICD-10-CM

## 2012-04-01 ENCOUNTER — Encounter (INDEPENDENT_AMBULATORY_CARE_PROVIDER_SITE_OTHER): Payer: Self-pay

## 2012-04-08 ENCOUNTER — Other Ambulatory Visit (HOSPITAL_COMMUNITY): Payer: Self-pay | Admitting: Internal Medicine

## 2012-04-08 ENCOUNTER — Ambulatory Visit (HOSPITAL_COMMUNITY)
Admission: RE | Admit: 2012-04-08 | Discharge: 2012-04-08 | Disposition: A | Discharge status: Home / Self Care | Payer: 59 | Source: Ambulatory Visit | Attending: Internal Medicine | Admitting: Internal Medicine

## 2012-04-08 DIAGNOSIS — Z1231 Encounter for screening mammogram for malignant neoplasm of breast: Secondary | ICD-10-CM | POA: Insufficient documentation

## 2012-05-04 ENCOUNTER — Ambulatory Visit (INDEPENDENT_AMBULATORY_CARE_PROVIDER_SITE_OTHER): Payer: Commercial Managed Care - PPO | Admitting: Surgery

## 2012-05-04 ENCOUNTER — Encounter (INDEPENDENT_AMBULATORY_CARE_PROVIDER_SITE_OTHER): Payer: Self-pay | Admitting: Surgery

## 2012-05-04 VITALS — BP 128/64 | HR 68 | Temp 97.1°F | Resp 20 | Ht 63.5 in | Wt 163.8 lb

## 2012-05-04 DIAGNOSIS — C73 Malignant neoplasm of thyroid gland: Secondary | ICD-10-CM

## 2012-05-04 DIAGNOSIS — E039 Hypothyroidism, unspecified: Secondary | ICD-10-CM

## 2012-05-04 NOTE — Progress Notes (Deleted)
General Surgery [facility]  Visit Diagnoses: No diagnosis found.  HISTORY: ***  PERTINENT REVIEW OF SYSTEMS: ***  EXAM: HEENT: normocephalic; pupils equal and reactive; sclerae clear; dentition good; mucous membranes moist NECK:  symmetric on extension; no palpable anterior or posterior cervical lymphadenopathy; no supraclavicular masses; no tenderness CHEST: clear to auscultation bilaterally without rales, rhonchi, or wheezes CARDIAC: regular rate and rhythm without significant murmur; peripheral pulses are full EXT:  non-tender without edema; no deformity NEURO: no gross focal deficits; no sign of tremor   IMPRESSION: ***  PLAN: ***  Velora Heckler, MD, [facility] Office: [phone]

## 2012-05-04 NOTE — Progress Notes (Signed)
General Surgery [facility]  Visit Diagnoses: 1. Thyroid cancer, micropapillary, pT1a, pNx   2. Hypothyroidism     HISTORY: Patient is a 56 year old black female who underwent total thyroidectomy one year ago for multinodular goiter. She had an incidental finding of a micro-papillary thyroid carcinoma. Postoperatively she had significant alteration in voice quality. This has gradually improved. She was evaluated at [facility] in the department of otolaryngology.  This does not appear to be an injury to her recurrent laryngeal nerves. Voice quality changes are multifactorial in origin. Her voice has improved considerably with management of her gastroesophageal reflux and with an increase in dosage of her thyroid hormone to her present dose of Synthroid 100 mcg daily.  PERTINENT REVIEW OF SYSTEMS: Continued improvement in overall voice quality. Improvement in preoperative dysphagia.  EXAM: HEENT: normocephalic; pupils equal and reactive; sclerae clear; dentition good; mucous membranes moist NECK:  symmetric on extension; no palpable anterior or posterior cervical lymphadenopathy; no supraclavicular masses; no tenderness; well-healed surgical incision with good cosmetic result CHEST: clear to auscultation bilaterally without rales, rhonchi, or wheezes CARDIAC: regular rate and rhythm without significant murmur; peripheral pulses are full EXT:  non-tender without edema; no deformity NEURO: no gross focal deficits; no sign of tremor   IMPRESSION: Status post total thyroidectomy for multinodular goiter and micro-papillary thyroid carcinoma, no evidence of recurrent disease  PLAN: Patient will continue followup with her primary care physician. She will be evaluated again at [facility] by otolaryngology with plans for a repeat and direct laryngoscopy. Patient will return to see me for a final evaluation in one year.  Velora Heckler, MD, [facility] Office: [phone]

## 2012-06-04 IMAGING — US US THYROID BIOPSY
1 series · 13 of 14 positions shown · non-contrast
Comparison: Ultrasound of the thyroid performed on 08/30/2010.

CLINICAL DATA: History of multinodular goiter and hypothyroidism.
Request has been made for dominant right-sided thyroid nodule
needle biopsy.

ULTRASOUND GUIDED NEEDLE ASPIRATE BIOPSY OF THE THYROID GLAND

[Series 1: us thyroid biopsy · 0.06mm/px · 13 of 14 slices shown]
[im 1/14]
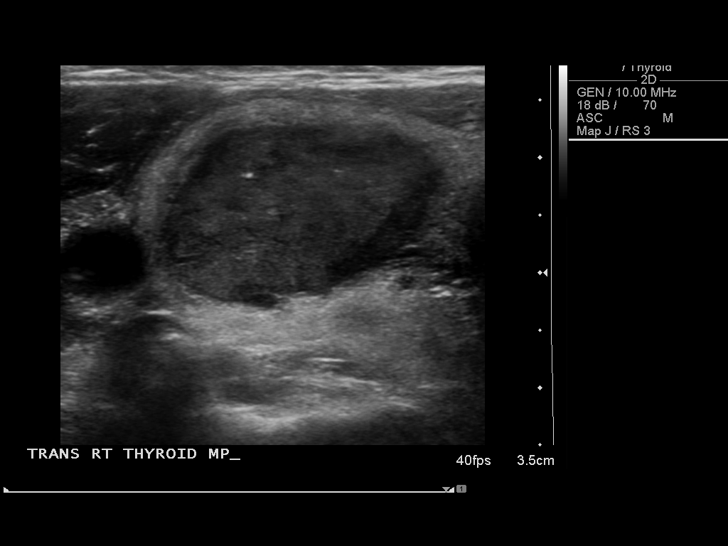
[im 2/14]
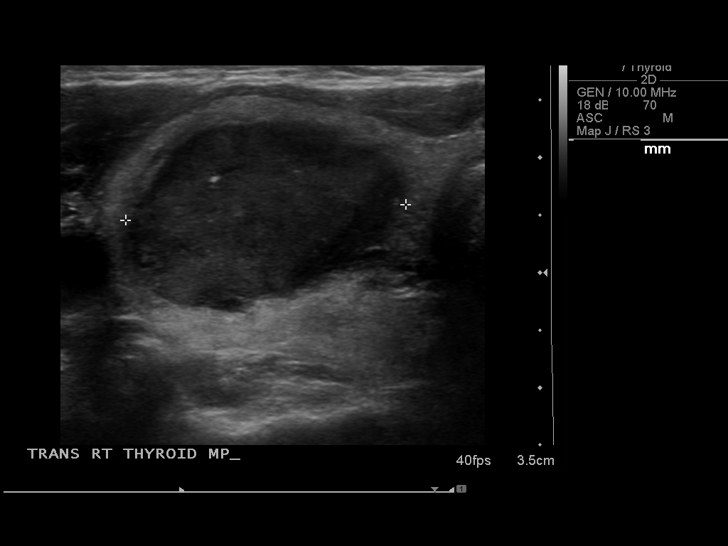
[im 3/14]
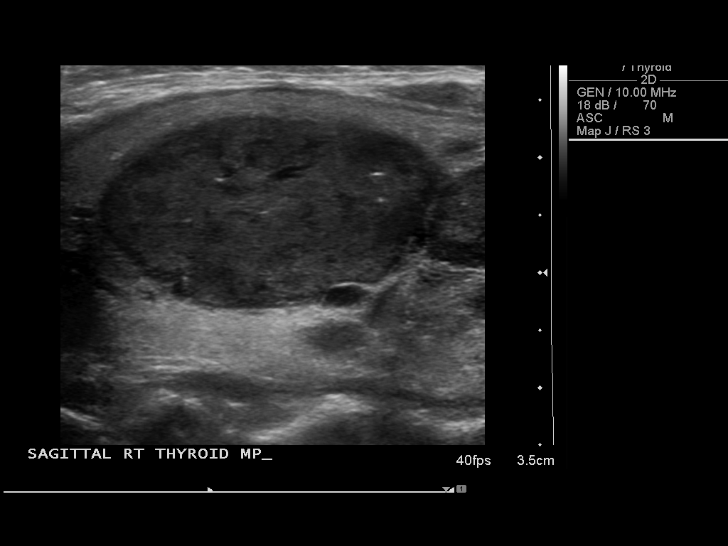
[im 4/14]
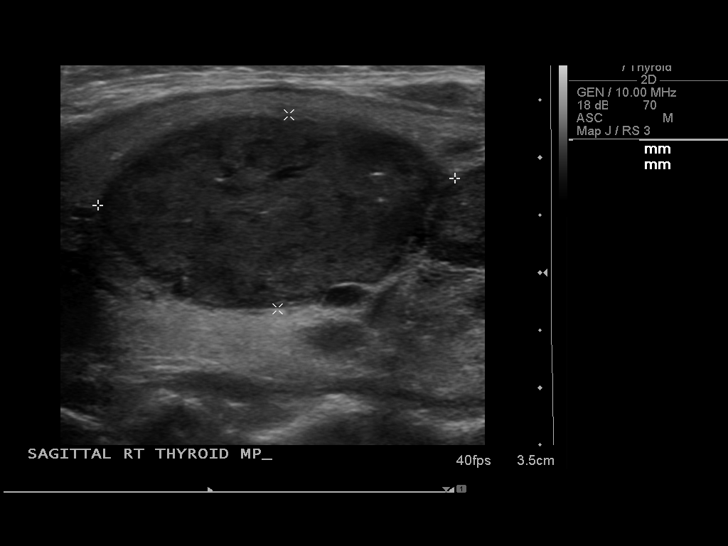
[im 5/14]
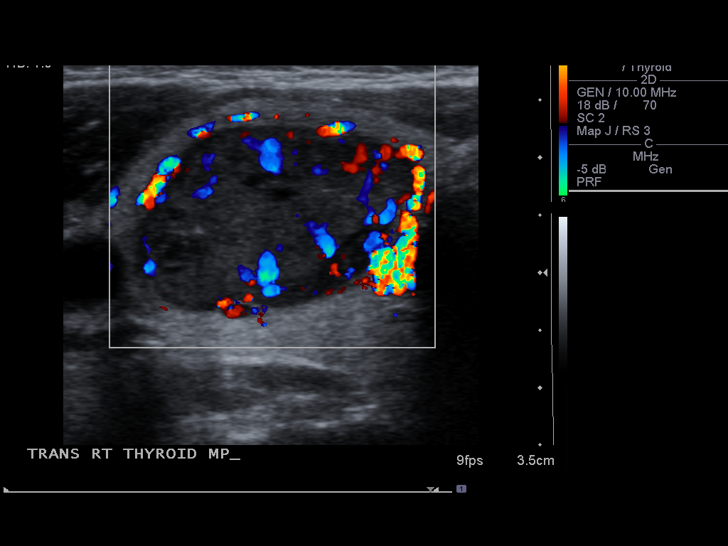
[im 6/14]
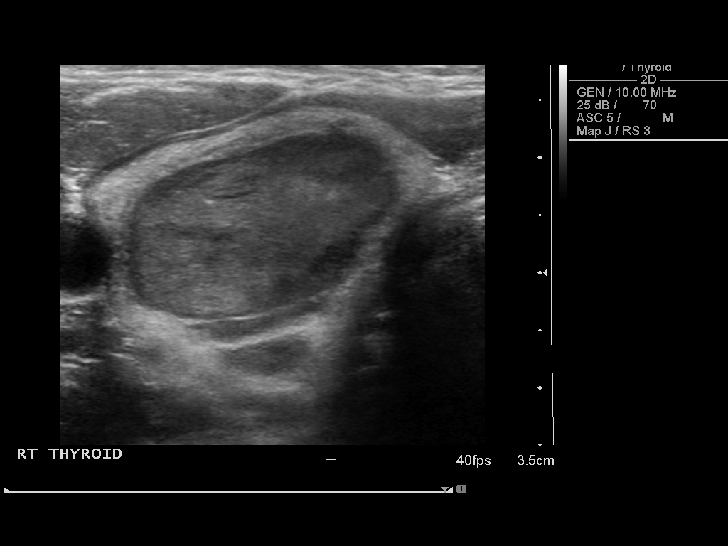
[im 8/14]
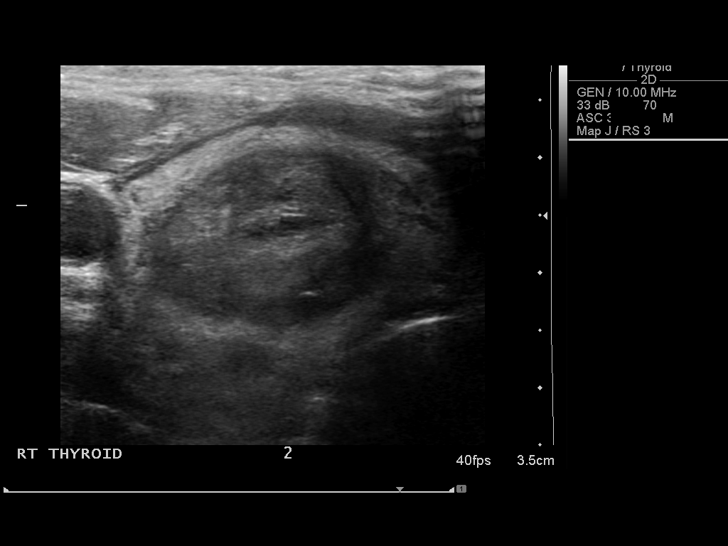
[im 9/14]
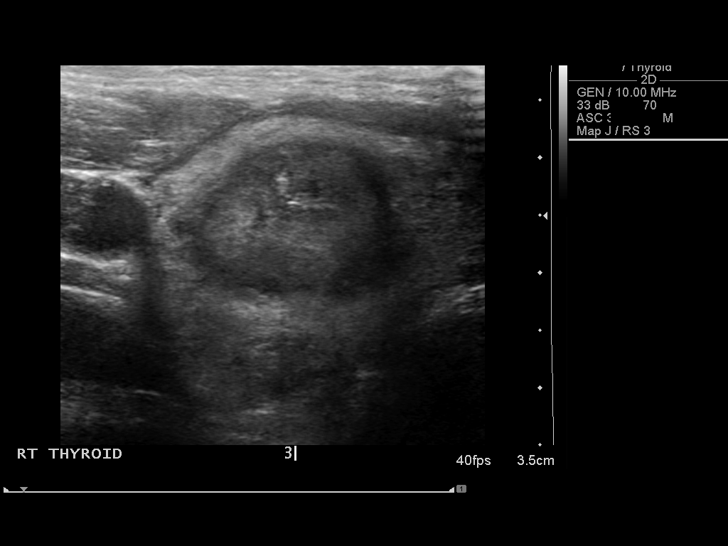
[im 10/14]
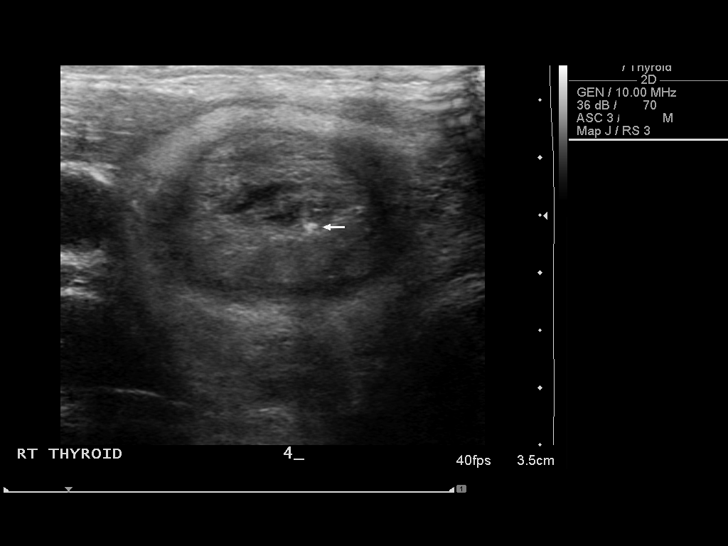
[im 11/14]
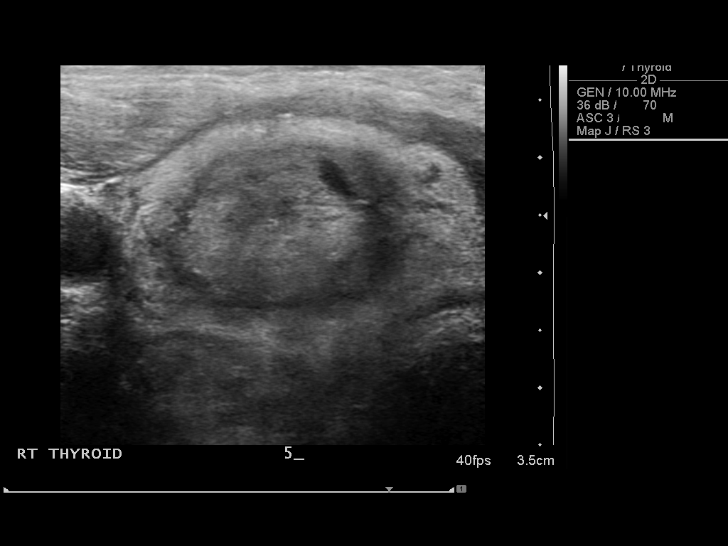
[im 12/14]
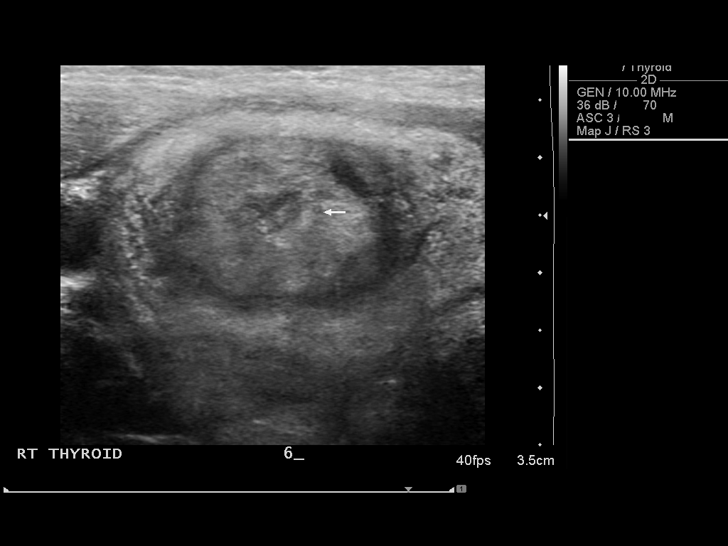
[im 13/14]
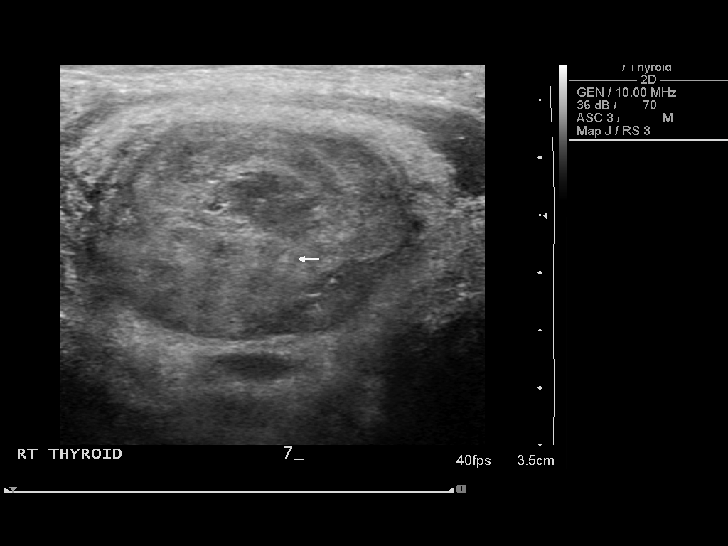
[im 14/14]
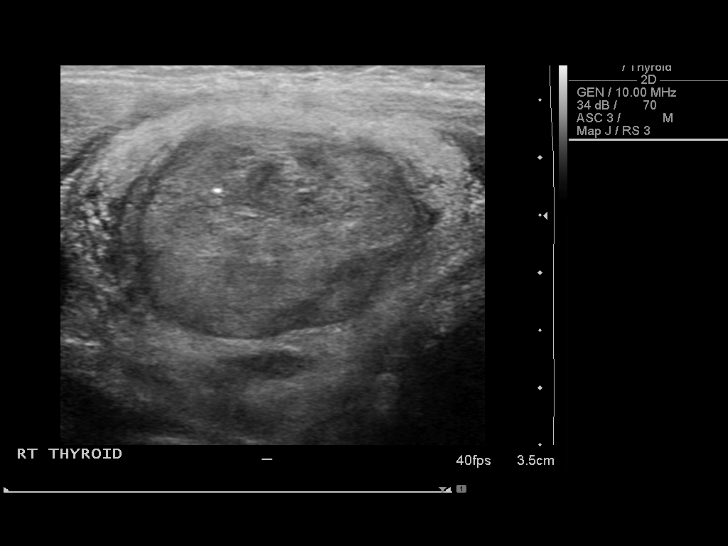

[13 of 14 positions shown; findings below may reference images not displayed]

Thyroid biopsy was thoroughly discussed with the patient and
questions were answered.  The benefits, risks, alternatives, and
complications were also discussed.  The patient understands and
wishes to proceed with the procedure.  Written consent was
obtained.

Ultrasound was performed to localize and mark an adequate site for
the biopsy.  The patient was then prepped and draped in a normal
sterile fashion.  Local anesthesia was provided with 1% lidocaine.
Using direct ultrasound guidance, 4 passes were made using 25
needles into the nodule within the right lobe of the thyroid.
Quick stain was performed due to increased vascularity on specimen
which reveals scant cellularity.  Three additional passes were made
using 25 gauge needles into this right dominant nodule.  Ultrasound
was used to confirm needle placements on all occasions.  Specimens
were sent to Pathology for analysis.

Complications:  None
FINDINGS: A dominant vascular nodule of the right thyroid.
IMPRESSION: Ultrasound guided needle aspirate biopsy performed of the right
thyroid nodule.

Read by: Boren, Jef.-JOVITA

## 2012-08-06 ENCOUNTER — Other Ambulatory Visit (HOSPITAL_COMMUNITY): Payer: Self-pay | Admitting: Internal Medicine

## 2012-08-06 ENCOUNTER — Ambulatory Visit (HOSPITAL_COMMUNITY)
Admission: RE | Admit: 2012-08-06 | Discharge: 2012-08-06 | Disposition: A | Discharge status: Home / Self Care | Payer: 59 | Source: Ambulatory Visit | Attending: Internal Medicine | Admitting: Internal Medicine

## 2012-08-06 DIAGNOSIS — I10 Essential (primary) hypertension: Secondary | ICD-10-CM

## 2012-08-06 DIAGNOSIS — F17200 Nicotine dependence, unspecified, uncomplicated: Secondary | ICD-10-CM | POA: Insufficient documentation

## 2012-08-06 DIAGNOSIS — C73 Malignant neoplasm of thyroid gland: Secondary | ICD-10-CM | POA: Insufficient documentation

## 2012-09-21 ENCOUNTER — Inpatient Hospital Stay (HOSPITAL_COMMUNITY): Admission: RE | Admit: 2012-09-21 | Payer: Self-pay | Source: Ambulatory Visit

## 2012-09-21 ENCOUNTER — Other Ambulatory Visit (HOSPITAL_COMMUNITY): Payer: Self-pay

## 2012-09-21 ENCOUNTER — Other Ambulatory Visit (HOSPITAL_COMMUNITY): Payer: Self-pay | Admitting: Specialist

## 2012-09-21 DIAGNOSIS — H4710 Unspecified papilledema: Secondary | ICD-10-CM

## 2012-09-21 DIAGNOSIS — R51 Headache: Secondary | ICD-10-CM

## 2012-09-22 ENCOUNTER — Ambulatory Visit (HOSPITAL_COMMUNITY): Payer: 59

## 2012-09-22 ENCOUNTER — Ambulatory Visit (HOSPITAL_COMMUNITY)
Admission: RE | Admit: 2012-09-22 | Discharge: 2012-09-22 | Disposition: A | Discharge status: Home / Self Care | Payer: 59 | Source: Ambulatory Visit | Attending: Specialist | Admitting: Specialist

## 2012-09-22 ENCOUNTER — Ambulatory Visit (HOSPITAL_COMMUNITY): Admission: RE | Admit: 2012-09-22 | Payer: 59 | Source: Ambulatory Visit

## 2012-09-22 DIAGNOSIS — E236 Other disorders of pituitary gland: Secondary | ICD-10-CM | POA: Insufficient documentation

## 2012-09-22 DIAGNOSIS — R51 Headache: Secondary | ICD-10-CM | POA: Insufficient documentation

## 2012-09-22 DIAGNOSIS — H4710 Unspecified papilledema: Secondary | ICD-10-CM | POA: Insufficient documentation

## 2012-09-22 MED ORDER — GADOBENATE DIMEGLUMINE 529 MG/ML IV SOLN
15.0000 mL | Freq: Once | INTRAVENOUS | Status: AC | PRN
  Administered 2012-09-22: 15 mL via INTRAVENOUS

## 2012-11-19 ENCOUNTER — Ambulatory Visit (INDEPENDENT_AMBULATORY_CARE_PROVIDER_SITE_OTHER): Payer: Self-pay | Admitting: Neurology

## 2012-11-19 ENCOUNTER — Other Ambulatory Visit: Payer: Self-pay | Admitting: Neurology

## 2012-11-19 DIAGNOSIS — H4710 Unspecified papilledema: Secondary | ICD-10-CM

## 2012-11-19 NOTE — Procedures (Signed)
Lumbar puncture procedure note  History:  Tammy Duran is a 57 year old patient with a history of migraine headaches. The patient has been noted to have mild papilledema, unassociated with worsening headaches, neck stiffness, muffled hearing, or double vision. The patient has been noted to have enlarged optic nerve sheaths by MRI the brain. The patient is being evaluated for a possible pseudotumor syndrome.  The patient was placed in the fetal position on the right side, and the low back was cleaned with Betadine solution. Approximately 2 cc of 1% Xylocaine was used as a local anesthetic. A 20-gauge spinal needle was inserted into the L3-4 interspace, and approximately 18 cc of clear colorless spinal fluid was removed for testing. Opening pressure was 275 mm of water.  Tube #1 was sent for VDRL, cryptococcal antigen, and angiotensin-converting enzyme level.  Tube #2 was sent for Lyme antibody panel.  Tube #3 was sent for cells, differential, glucose, and protein.  Tube #4 was saved.  Blood work was sent for HIV, ANA, rheumatoid factor, and sedimentation rate.  The patient tolerated the procedure well. No complications of the above procedure were noted.

## 2012-11-20 LAB — HIV ANTIBODY (ROUTINE TESTING W REFLEX)
HIV 1/O/2 Abs-Index Value: <1 (ref <1.00)
HIV-1/HIV-2 Ab: NONREACTIVE

## 2012-11-20 LAB — ANA W/REFLEX: Anti Nuclear Antibody(ANA): NEGATIVE

## 2012-11-21 LAB — CELL COUNT, CSF: Nuc cell # CSF: 2 cells/uL (ref 0–5)

## 2012-11-21 LAB — PROTEIN, TOTAL, CSF: Protein, CSF: 15.7 mg/dL (ref 15.0–45.0)

## 2012-11-23 ENCOUNTER — Encounter: Payer: Self-pay | Admitting: Neurology

## 2012-11-24 ENCOUNTER — Telehealth: Payer: Self-pay | Admitting: Neurology

## 2012-11-24 LAB — LYME, WESTERN BLOT, CSF
Lyme IgG WB Interp.: NEGATIVE
Lyme IgM WB Interp.: NEGATIVE
P18 Ab.: ABSENT
P23 Ab. IgM: ABSENT
P39 Ab. IgM: ABSENT
P41 Ab. IgM: ABSENT
P45 Ab.: ABSENT
P58 Ab.: ABSENT
P66 Ab.: ABSENT
P93 Ab.: ABSENT

## 2012-11-24 LAB — CRYPTOCOCCAL ANTIGEN, CSF: Crypto Ag, CSF: NEGATIVE

## 2012-11-24 NOTE — Telephone Encounter (Signed)
I called the patient. The spinal fluid and blood work evaluations were unremarkable. The spinal fluid pressure was elevated at 275. The patient has mild papilledema. I would alter the medications, and getting the patient off of high for a thiazide and using Lasix which she already has, adding potassium to the regimen. If the patient is amenable to this, she is to contact her office.

## 2012-11-25 ENCOUNTER — Telehealth: Payer: Self-pay | Admitting: Neurology

## 2012-11-27 MED ORDER — ACETAZOLAMIDE 125 MG PO TABS: 125.0000 mg | ORAL_TABLET | Freq: Two times a day (BID) | ORAL | Status: DC

## 2012-11-27 NOTE — Telephone Encounter (Signed)
Spoke to patient and she would like to speak to doctor about changing her medications.  She saw her PCP who was agreeable to her taking the Diamox and getting off the HCTZ.  The patient prefers not to take Lasix.  She can be reached at work [phone].  I was not able to leave a message on her cell phone [phone].

## 2012-11-27 NOTE — Telephone Encounter (Signed)
I called patient and I left a message. I will start low-dose Diamox. The patient will be coming off of hydrochlorothiazide. The patient will contact me if she has problems on the medication.

## 2012-12-21 ENCOUNTER — Telehealth: Payer: Self-pay | Admitting: Neurology

## 2012-12-21 NOTE — Telephone Encounter (Signed)
Sent to MR to get records.

## 2012-12-21 NOTE — Telephone Encounter (Signed)
Patient is calling to tell us Dr. Allena Katz, one of her physicians, needs to see all her records from this practice.  She can be reached at [phone] [place]) today until 4:30 afterwards, she will have her cell [phone]

## 2013-03-03 ENCOUNTER — Telehealth: Payer: Self-pay | Admitting: Neurology

## 2013-03-19 ENCOUNTER — Other Ambulatory Visit: Payer: Self-pay | Admitting: Obstetrics and Gynecology

## 2013-03-19 DIAGNOSIS — Z780 Asymptomatic menopausal state: Secondary | ICD-10-CM

## 2013-03-30 ENCOUNTER — Other Ambulatory Visit: Payer: 59

## 2013-04-01 ENCOUNTER — Telehealth: Payer: Self-pay | Admitting: *Deleted

## 2013-04-01 NOTE — Telephone Encounter (Signed)
Terri, calling from Dr. Eliane Decree office about appt for pt (RV).   I called and LMVM for pt on [phone] to return call about RV.

## 2013-04-05 NOTE — Telephone Encounter (Signed)
I called pt again and LMVM for her to return call.  I was following up on the message from Dr. Eliane Decree office.

## 2013-04-05 NOTE — Telephone Encounter (Signed)
Reason get a work in revisit sometime within the next one or 2 weeks. May see NP.

## 2013-04-06 ENCOUNTER — Telehealth: Payer: Self-pay | Admitting: *Deleted

## 2013-04-07 ENCOUNTER — Other Ambulatory Visit (HOSPITAL_COMMUNITY): Payer: Self-pay | Admitting: Internal Medicine

## 2013-04-07 DIAGNOSIS — Z1231 Encounter for screening mammogram for malignant neoplasm of breast: Secondary | ICD-10-CM

## 2013-04-12 ENCOUNTER — Ambulatory Visit
Admission: RE | Admit: 2013-04-12 | Discharge: 2013-04-12 | Disposition: A | Discharge status: Home / Self Care | Payer: 59 | Source: Ambulatory Visit | Attending: Obstetrics and Gynecology | Admitting: Obstetrics and Gynecology

## 2013-04-12 DIAGNOSIS — Z780 Asymptomatic menopausal state: Secondary | ICD-10-CM

## 2013-04-19 ENCOUNTER — Encounter: Payer: Self-pay | Admitting: Neurology

## 2013-04-19 ENCOUNTER — Ambulatory Visit (INDEPENDENT_AMBULATORY_CARE_PROVIDER_SITE_OTHER): Payer: 59 | Admitting: Neurology

## 2013-04-19 ENCOUNTER — Ambulatory Visit (HOSPITAL_COMMUNITY)
Admission: RE | Admit: 2013-04-19 | Discharge: 2013-04-19 | Disposition: A | Discharge status: Home / Self Care | Payer: 59 | Source: Ambulatory Visit | Attending: Internal Medicine | Admitting: Internal Medicine

## 2013-04-19 VITALS — BP 128/77 | HR 60 | Wt 156.0 lb

## 2013-04-19 DIAGNOSIS — H4710 Unspecified papilledema: Secondary | ICD-10-CM

## 2013-04-19 DIAGNOSIS — G932 Benign intracranial hypertension: Secondary | ICD-10-CM | POA: Insufficient documentation

## 2013-04-19 DIAGNOSIS — Z1231 Encounter for screening mammogram for malignant neoplasm of breast: Secondary | ICD-10-CM | POA: Insufficient documentation

## 2013-04-19 LAB — HM MAMMOGRAPHY: HM Mammogram: NORMAL

## 2013-04-19 NOTE — Progress Notes (Signed)
Reason for visit: Pseudotumor cerebri  Tammy Duran is an 57 y.o. female  History of present illness:  Tammy Duran is a 57 year old right-handed black female with a history of migraine headaches since her teenage years. The patient was noted to have mild papilledema on an ophthalmologic evaluation. Lumbar puncture done revealed evidence of an elevated opening pressure consistent with pseudotumor cerebri, with normal spinal fluid analysis. The patient indicates that she was on an estrogen patch, but she is not on this medication currently. The patient has been placed on Diamox, and she has done better with her headaches. The patient is having on average one headache a month, and she still has some chronic neck discomfort. The patient denies any further double vision issues, or muffled hearing. The patient indicates that she was recently seen by her ophthalmologist, and she was told that the papilledema is gradually improving. The patient has been set up to see Dr. Theone Murdoch at [facility] for a neuro-ophthalmologic evaluation. The patient reports some tingling in the fingers and toes with the Diamox, but she is otherwise tolerating the medication well.  Past Medical History  Diagnosis Date  . Hypertension   . Hyperlipidemia   . Thyroid disease   . Papilledema     bilateral  . Migraine headache   . Anxiety disorder   . History of thyroid cancer   . GERD (gastroesophageal reflux disease)   . Pseudotumor cerebri 04/19/2013    Past Surgical History  Procedure Laterality Date  . Abdominal hysterectomy  2005  . Thyroidectomy  04/25/11    Family History  Problem Relation Age of Onset  . Diabetes Mother   . Heart disease Mother   . Depression Father   . Stroke Brother   . Cancer Maternal Aunt     lung    Social history:  reports that she has been smoking Cigarettes.  She has been smoking about 0.20 packs per day. She has never used smokeless tobacco. She reports that  drinks alcohol. She  reports that she does not use illicit drugs.    Allergies  Allergen Reactions  . Erythromycin Nausea And Vomiting and Other (See Comments)    Diarrhea - more severe    Medications:  Current Outpatient Prescriptions on File Prior to Visit  Medication Sig Dispense Refill  . acetaZOLAMIDE (DIAMOX) 125 MG tablet Take 1 tablet (125 mg total) by mouth 2 (two) times daily.  60 tablet  3  . ALPRAZolam (XANAX) 0.5 MG tablet Take 0.5 mg by mouth at bedtime as needed.      Marland Kitchen aspirin 81 MG tablet Take 81 mg by mouth daily.        Marland Kitchen atenolol (TENORMIN) 50 MG tablet Take 50 mg by mouth daily.       . Biotin 5000 MCG TABS Take by mouth daily.      Marland Kitchen buPROPion (WELLBUTRIN XL) 300 MG 24 hr tablet Take 300 mg by mouth daily. One tablet every morning      . cetirizine (ZYRTEC) 10 MG tablet Take by mouth daily.      . Cholecalciferol (VITAMIN D-3 PO) Take 1,000 Units by mouth daily.      Marland Kitchen CINNAMON PO Take by mouth daily.      . Multiple Vitamins-Minerals (HAIR/SKIN/NAILS PO) Take by mouth daily. 3 tablets daily      . Omega-3 Fatty Acids (FISH OIL) 1000 MG CAPS Take by mouth daily.      . vitamin E 400 UNIT  capsule Take 400 Units by mouth daily.      Marland Kitchen ezetimibe (ZETIA) 10 MG tablet Take 10 mg by mouth daily.         No current facility-administered medications on file prior to visit.    ROS:  Out of a complete 14 system review of symptoms, the patient complains only of the following symptoms, and all other reviewed systems are negative.  Headache Tingling  Blood pressure 128/77, pulse 60, weight 156 lb (70.761 kg).  Physical Exam  General: The patient is alert and cooperative at the time of the examination.  Skin: No significant peripheral edema is noted.   Neurologic Exam  Cranial nerves: Facial symmetry is present. Speech is normal, no aphasia or dysarthria is noted. Extraocular movements are full. Visual fields are full. Pupils are equal, round, and reactive to light. Discs revealed  mild blurring of the margins bilaterally. No hemorrhages are seen.  Motor: The patient has good strength in all 4 extremities.  Coordination: The patient has good finger-nose-finger and heel-to-shin bilaterally.  Gait and station: The patient has a normal gait. Tandem gait is normal. Romberg is negative. No drift is seen.  Reflexes: Deep tendon reflexes are symmetric, but are depressed.   Assessment/Plan:  1. Pseudotumor cerebri  2. History of migraine headache  The patient is doing well at this time, and the ophthalmologic evaluations have indicated a gradual improvement in the papilledema. We will keep the patient on her current dose of the Diamox for now, and have her followup in about 6 months. The patient was on an estrogen patch, but she indicates that she has been off of this medication for some time. The patient is not markedly overweight, as would be typical for a pseudotumor cerebri patient. The patient will followup in about 6 months.  Marlan Palau MD 04/19/2013 6:58 PM  [facility] [address] [place], [place] [address]  Phone [phone] Fax [phone]

## 2013-05-04 ENCOUNTER — Other Ambulatory Visit: Payer: Self-pay | Admitting: Neurology

## 2013-05-20 ENCOUNTER — Other Ambulatory Visit: Payer: Self-pay

## 2013-05-31 NOTE — Telephone Encounter (Signed)
Visit scheduled °

## 2013-06-14 DIAGNOSIS — G932 Benign intracranial hypertension: Secondary | ICD-10-CM | POA: Insufficient documentation

## 2013-06-15 ENCOUNTER — Telehealth: Payer: Self-pay | Admitting: Neurology

## 2013-06-15 NOTE — Telephone Encounter (Signed)
The patient was seen by Dr. Theone Murdoch at [facility] who felt that the ophthalmologic evaluation was normal.

## 2013-06-17 ENCOUNTER — Other Ambulatory Visit: Payer: Self-pay | Admitting: Internal Medicine

## 2013-06-17 ENCOUNTER — Encounter: Payer: Self-pay | Admitting: Emergency Medicine

## 2013-06-17 ENCOUNTER — Ambulatory Visit (INDEPENDENT_AMBULATORY_CARE_PROVIDER_SITE_OTHER): Payer: 59 | Admitting: Emergency Medicine

## 2013-06-17 VITALS — BP 124/72 | HR 60 | Temp 98.2°F | Resp 18 | Ht 63.75 in | Wt 156.0 lb

## 2013-06-17 DIAGNOSIS — J309 Allergic rhinitis, unspecified: Secondary | ICD-10-CM

## 2013-06-17 DIAGNOSIS — J329 Chronic sinusitis, unspecified: Secondary | ICD-10-CM

## 2013-06-17 MED ORDER — AZITHROMYCIN 250 MG PO TABS: ORAL_TABLET | ORAL | Status: AC

## 2013-06-17 MED ORDER — PREDNISONE 10 MG PO TABS: ORAL_TABLET | ORAL | Status: DC

## 2013-06-17 NOTE — Progress Notes (Signed)
Subjective:    Patient ID: Tammy Duran, female    DOB: [birth year], 56 y.o.   MRN: [id number]  HPI Comments: 57 yo presents with sinus drainage x 3 weeks with increase of pressure and colored production x 1 week. She has been trying Zyrtec and OTC cold without relief. She notes she is also starting to feel more fatigued. She denies fever or cough.  Headache  Associated symptoms include sinus pressure.  Sinusitis Associated symptoms include congestion, headaches and sinus pressure.    Current Outpatient Prescriptions on File Prior to Visit  Medication Sig Dispense Refill  . acetaZOLAMIDE (DIAMOX) 125 MG tablet TAKE 1 TABLET BY MOUTH TWICE DAILY  60 tablet  5  . ALPRAZolam (XANAX) 0.5 MG tablet Take 0.5 mg by mouth at bedtime as needed.      Marland Kitchen aspirin 81 MG tablet Take 81 mg by mouth daily.        Marland Kitchen atenolol (TENORMIN) 50 MG tablet Take 50 mg by mouth daily.       . Biotin 5000 MCG TABS Take by mouth daily.      Marland Kitchen buPROPion (WELLBUTRIN XL) 300 MG 24 hr tablet Take 300 mg by mouth daily. One tablet every morning      . cetirizine (ZYRTEC) 10 MG tablet Take by mouth daily.      . Cholecalciferol (VITAMIN D-3 PO) Take 1,000 Units by mouth daily.      Marland Kitchen CINNAMON PO Take by mouth daily.      Marland Kitchen levothyroxine (SYNTHROID, LEVOTHROID) 112 MCG tablet Take 112 mcg by mouth daily before breakfast.      . lisinopril (PRINIVIL,ZESTRIL) 20 MG tablet Take 20 mg by mouth daily.      . Multiple Vitamins-Minerals (HAIR/SKIN/NAILS PO) Take by mouth daily. 3 tablets daily      . Omega-3 Fatty Acids (FISH OIL) 1000 MG CAPS Take by mouth daily.      . vitamin E 400 UNIT capsule Take 400 Units by mouth daily.       No current facility-administered medications on file prior to visit.   ALLERGIES Erythromycin and Keflex  Past Medical History  Diagnosis Date  . Hypertension   . Hyperlipidemia   . Thyroid disease   . Papilledema     bilateral  . Migraine headache   . Anxiety disorder   . History of  thyroid cancer   . GERD (gastroesophageal reflux disease)   . Pseudotumor cerebri 04/19/2013     Review of Systems  HENT: Positive for congestion, postnasal drip and sinus pressure.   Neurological: Positive for headaches.  All other systems reviewed and are negative.    BP 124/72  Pulse 60  Temp(Src) 98.2 F (36.8 C) (Temporal)  Resp 18  Ht 5' 3.75" (1.619 m)  Wt 156 lb (70.761 kg)  BMI 27.00 kg/m2     Objective:   Physical Exam  Nursing note and vitals reviewed. Constitutional: She is oriented to person, place, and time. She appears well-developed and well-nourished.  HENT:  Head: Normocephalic and atraumatic.  Right Ear: External ear normal.  Left Ear: External ear normal.  Nose: Nose normal.  Mouth/Throat: Oropharynx is clear and moist.  Frontal Tenderness, Yellow TMs Bilaterally  Eyes: Conjunctivae and EOM are normal.  Neck: Normal range of motion.  Cardiovascular: Normal rate, regular rhythm, normal heart sounds and intact distal pulses.   Pulmonary/Chest: Effort normal and breath sounds normal.  Musculoskeletal: Normal range of motion.  Lymphadenopathy:    She  has no cervical adenopathy.  Neurological: She is alert and oriented to person, place, and time.  Skin: Skin is warm and dry.  Psychiatric: She has a normal mood and affect. Judgment normal.          Assessment & Plan:  Sinus infection/ Allergic rhinitis- ZPak, Pred DP 10mg  AD, switch to Allegra increase H2O

## 2013-06-17 NOTE — Patient Instructions (Signed)
Sinus Headache °A sinus headache happens when your sinuses become clogged or puffy (swollen). Sinus headaches can be mild or severe. °HOME CARE °· Take your medicines (antibiotics) as told. Finish them even if you start to feel better. °· Only take medicine as told by your doctor. °· Use a nose spray if you feel stuffed up (congested). °GET HELP RIGHT AWAY IF: °· You have a fever. °· You have trouble seeing. °· You suddenly have pain in your face or head. °· You start to twitch or shake (seizure). °· You are confused. °· You get headaches more than once a week. °· Light or sound bothers you. °· You feel sick to your stomach (nauseous) or throw up (vomit). °· Your headaches do not get better with treatment. °MAKE SURE YOU: °· Understand these instructions. °· Will watch your condition. °· Will get help right away if you are not doing well or get worse. °Document Released: 10/31/2010 Document Revised: 09/23/2011 Document Reviewed: 10/31/2010 °[employer]® Patient Information ©2014 [employer]. ° °

## 2013-06-22 ENCOUNTER — Encounter: Payer: Self-pay | Admitting: Internal Medicine

## 2013-06-28 ENCOUNTER — Encounter (INDEPENDENT_AMBULATORY_CARE_PROVIDER_SITE_OTHER): Payer: 59 | Admitting: Internal Medicine

## 2013-06-28 DIAGNOSIS — R7303 Prediabetes: Secondary | ICD-10-CM | POA: Insufficient documentation

## 2013-06-28 DIAGNOSIS — E782 Mixed hyperlipidemia: Secondary | ICD-10-CM | POA: Insufficient documentation

## 2013-06-28 DIAGNOSIS — Z1212 Encounter for screening for malignant neoplasm of rectum: Secondary | ICD-10-CM

## 2013-06-28 DIAGNOSIS — E559 Vitamin D deficiency, unspecified: Secondary | ICD-10-CM | POA: Insufficient documentation

## 2013-06-28 DIAGNOSIS — I10 Essential (primary) hypertension: Secondary | ICD-10-CM | POA: Insufficient documentation

## 2013-06-28 DIAGNOSIS — R7309 Other abnormal glucose: Secondary | ICD-10-CM | POA: Insufficient documentation

## 2013-06-28 NOTE — Patient Instructions (Signed)

## 2013-06-28 NOTE — Progress Notes (Signed)
This encounter was created in error - please disregard. This encounter was created in error - please disregard. This encounter was created in error - please disregard. 

## 2013-06-28 NOTE — Progress Notes (Signed)
Patient ID: Tammy Duran, female   DOB: [birth year], 56 y.o.   MRN: [id number]

## 2013-07-14 ENCOUNTER — Encounter: Payer: Self-pay | Admitting: Internal Medicine

## 2013-07-14 ENCOUNTER — Ambulatory Visit (INDEPENDENT_AMBULATORY_CARE_PROVIDER_SITE_OTHER): Payer: 59 | Admitting: Internal Medicine

## 2013-07-14 VITALS — BP 134/86 | HR 64 | Temp 97.7°F | Resp 16 | Ht 62.75 in | Wt 154.4 lb

## 2013-07-14 DIAGNOSIS — R7401 Elevation of levels of liver transaminase levels: Secondary | ICD-10-CM

## 2013-07-14 DIAGNOSIS — Z113 Encounter for screening for infections with a predominantly sexual mode of transmission: Secondary | ICD-10-CM

## 2013-07-14 DIAGNOSIS — I10 Essential (primary) hypertension: Secondary | ICD-10-CM

## 2013-07-14 DIAGNOSIS — Z1212 Encounter for screening for malignant neoplasm of rectum: Secondary | ICD-10-CM

## 2013-07-14 DIAGNOSIS — E559 Vitamin D deficiency, unspecified: Secondary | ICD-10-CM

## 2013-07-14 DIAGNOSIS — Z0000 Encounter for general adult medical examination without abnormal findings: Secondary | ICD-10-CM

## 2013-07-14 LAB — CBC WITH DIFFERENTIAL/PLATELET
Eosinophils Absolute: 0 10*3/uL (ref 0.0–0.7)
Eosinophils Relative: 1 % (ref 0–5)
HCT: 43.4 % (ref 36.0–46.0)
Lymphocytes Relative: 34 % (ref 12–46)
Lymphs Abs: 1.7 10*3/uL (ref 0.7–4.0)
MCH: 31.4 pg (ref 26.0–34.0)
MCHC: 33.2 g/dL (ref 30.0–36.0)
MCV: 94.6 fL (ref 78.0–100.0)
Monocytes Absolute: 0.2 10*3/uL (ref 0.1–1.0)
Monocytes Relative: 5 % (ref 3–12)
Platelets: 334 10*3/uL (ref 150–400)
RBC: 4.59 MIL/uL (ref 3.87–5.11)
WBC: 4.9 10*3/uL (ref 4.0–10.5)

## 2013-07-14 LAB — HEPATIC FUNCTION PANEL
ALT: 25 U/L (ref 0–35)
AST: 18 U/L (ref 0–37)
Albumin: 4.5 g/dL (ref 3.5–5.2)
Alkaline Phosphatase: 87 U/L (ref 39–117)
Indirect Bilirubin: 0.4 mg/dL (ref 0.0–0.9)
Total Protein: 7.7 g/dL (ref 6.0–8.3)

## 2013-07-14 LAB — HEPATITIS C ANTIBODY: HCV Ab: NEGATIVE

## 2013-07-14 LAB — HEMOGLOBIN A1C
Hgb A1c MFr Bld: 5.6 % (ref <5.7)
Mean Plasma Glucose: 114 mg/dL (ref <117)

## 2013-07-14 LAB — BASIC METABOLIC PANEL WITH GFR
CO2: 26 mEq/L (ref 19–32)
Calcium: 9.5 mg/dL (ref 8.4–10.5)
Chloride: 104 mEq/L (ref 96–112)
Creat: 1.02 mg/dL (ref 0.50–1.10)
GFR, Est Non African American: 62 mL/min
Glucose, Bld: 99 mg/dL (ref 70–99)
Sodium: 138 mEq/L (ref 135–145)

## 2013-07-14 LAB — LIPID PANEL
LDL Cholesterol: 95 mg/dL (ref 0–99)
Total CHOL/HDL Ratio: 2.4 Ratio
Triglycerides: 76 mg/dL (ref <150)
VLDL: 15 mg/dL (ref 0–40)

## 2013-07-14 LAB — HEPATITIS B SURFACE ANTIBODY,QUALITATIVE: Hep B S Ab: POSITIVE — AB

## 2013-07-14 LAB — HEPATITIS B CORE ANTIBODY, TOTAL: Hep B Core Total Ab: NONREACTIVE

## 2013-07-14 LAB — VITAMIN B12: Vitamin B-12: 541 pg/mL (ref 211–911)

## 2013-07-14 LAB — TSH: TSH: 0.211 u[IU]/mL — ABNORMAL LOW (ref 0.350–4.500)

## 2013-07-14 NOTE — Progress Notes (Signed)
Patient ID: Tammy Duran, female   DOB: 1956-02-27, 57 y.o.   MRN: [id number]  "Tammy Duran"  Annual Screening Comprehensive Examination  This very nice 57 y.o. DBF presents for complete physical.  Patient has been followed for HTN,  Prediabetes, Hyperlipidemia, Hypothyroidism  & Thyroid Cancer and Vitamin D Deficiency.   HTN predates since 79.  Patient's BP has been controlled at home. Today's BP is 134/86. Patient denies any cardiac symptoms as chest pain, palpitations, shortness of breath, dizziness or ankle swelling.   Patient's hyperlipidemia is controlled with diet and medications. Patient denies myalgias or other medication SE's. Last cholesterol last visit was 198, triglycerides 109, HDL 72 and LDL 105.     Patient has prediabetes with A1c 5.9% in may 2009 with last A1c 5.5% normal in June. Patient denies reactive hypoglycemic symptoms, visual blurring, diabetic polys, or paresthesias.    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 More recently she was evaluated at [facility] in [place] by Dr Theone Murdoch.    Finally, patient has history of Vitamin D Deficiency with last vitamin D      Medication Sig Dispense Refill  . acetaZOLAMIDE (DIAMOX) 125 MG tablet TAKE 1 TABLET BY MOUTH TWICE DAILY  60 tablet  5  . ALPRAZolam (XANAX) 0.5 MG tablet Take 0.5 mg by mouth at bedtime as needed.      Marland Kitchen aspirin 81 MG tablet Take 81 mg by mouth daily.        Marland Kitchen atenolol (TENORMIN) 50 MG tablet Take 50 mg by mouth daily.       . Biotin 5000 MCG TABS Take by mouth daily.      Marland Kitchen buPROPion (WELLBUTRIN XL) 300 MG 24 hr tablet Take 300 mg by mouth daily. One tablet every morning      . cetirizine (ZYRTEC) 10 MG tablet Take by mouth daily.      . Cholecalciferol (VITAMIN D-3 PO) Take 1,000 Units by  mouth daily.      Marland Kitchen CINNAMON PO Take by mouth daily.      Marland Kitchen estradiol (VIVELLE-DOT) 0.0375 MG/24HR Place 1 patch onto the skin 2 (two) times a week.      . levothyroxine (SYNTHROID, LEVOTHROID) 112 MCG tablet Take 112 mcg by mouth daily before breakfast.      . lisinopril (PRINIVIL,ZESTRIL) 20 MG tablet Take 20 mg by mouth daily.      . Multiple Vitamins-Minerals (HAIR/SKIN/NAILS PO) Take by mouth daily. 3 tablets daily      . Omega-3 Fatty Acids (FISH OIL) 1000 MG CAPS Take by mouth daily.      . predniSONE (DELTASONE) 10 MG tablet 1 po tid x 3days, 1 po bid x 3days, 1 po qd x 5 days  20 tablet  0  . vitamin E 400 UNIT capsule Take 400 Units by mouth daily.      Marland Kitchen ZETIA 10 MG tablet TAKE 1 TABLET BY MOUTH ONCE DAILY  90 tablet  0    Allergies  Allergen Reactions  . Erythromycin Nausea And Vomiting and Other (See Comments)    Diarrhea - more severe    Past Medical History  Diagnosis Date  . Hypertension   . Hyperlipidemia   . Thyroid disease   . Papilledema     bilateral  .  Migraine headache   . Anxiety disorder   . History of thyroid cancer   . GERD (gastroesophageal reflux disease)   . Pseudotumor cerebri 04/19/2013  . Vitamin D deficiency   . Cancer     thyroid    Past Surgical History  Procedure Laterality Date  . Abdominal hysterectomy  2005  . Thyroidectomy  04/25/11    Family History  Problem Relation Age of Onset  . Diabetes Mother   . Heart disease Mother   . Hypertension Mother   . Hyperlipidemia Mother   . COPD Mother   . Depression Father   . Suicidality Father   . Stroke Brother   . Heart disease Brother   . Cancer Maternal Aunt     lung  . Asthma Son     History  Substance Use Topics  . Smoking status: Current Every Day Smoker -- 0.20 packs/day    Types: Cigarettes  . Smokeless tobacco: Never Used  . Alcohol Use: Yes     Comment: weekends    ROS Constitutional: Denies fever, chills, weight loss/gain, headaches, insomnia, fatigue, night  sweats, and change in appetite. Eyes: Denies redness, blurred vision, diplopia, discharge, itchy, watery eyes.  ENT: Denies discharge, congestion, post nasal drip, epistaxis, sore throat, earache, hearing loss, dental pain, Tinnitus, Vertigo, Sinus pain, snoring.  Cardio: Denies chest pain, palpitations, irregular heartbeat, syncope, dyspnea, diaphoresis, orthopnea, PND, claudication, edema Respiratory: denies cough, dyspnea, DOE, pleurisy, hoarseness, laryngitis, wheezing.  Gastrointestinal: Denies dysphagia, heartburn, reflux, water brash, pain, cramps, nausea, vomiting, bloating, diarrhea, constipation, hematemesis, melena, hematochezia, jaundice, hemorrhoids Genitourinary: Denies dysuria, frequency, urgency, nocturia, hesitancy, discharge, hematuria, flank pain Breast:Breast lumps, nipple discharge, bleeding.  Musculoskeletal: Denies arthralgia, myalgia, stiffness, Jt. Swelling, pain, limp, and strain/sprain. Skin: Denies puritis, rash, hives, warts, acne, eczema, changing in skin lesion Neuro: No weakness, tremor, incoordination, spasms, paresthesia, pain Psychiatric: Denies confusion, memory loss, sensory loss Endocrine: Denies change in weight, skin, hair change, nocturia, and paresthesia, diabetic polys, visual blurring, hyper / hypo glycemic episodes.  Heme/Lymph: No excessive bleeding, bruising, enlarged lymph nodes.  BP: 134/86  Pulse: 64  Temp: 97.7 F (36.5 C)  Resp: 16    Estimated body mass index is 27.56 kg/(m^2) as calculated from the following:   Height as of this encounter: 5' 2.75" (1.594 m).   Weight as of this encounter: 154 lb 6.4 oz (70.035 kg).  Physical Exam General Appearance: Well nourished, in no apparent distress. Eyes: PERRLA, EOMs, conjunctiva no swelling or erythema, normal fundi and vessels. Sinuses: No frontal/maxillary tenderness ENT/Mouth: EACs patent / TMs  nl. Nares clear without erythema, swelling, mucoid exudates. Oral hygiene is good. No  erythema, swelling, or exudate. Tongue normal, non-obstructing. Tonsils not swollen or erythematous. Hearing normal.  Neck: Supple, thyroid normal. No bruits, nodes or JVD. Respiratory: Respiratory effort normal.  BS equal and clear bilateral without rales, rhonci, wheezing or stridor. Cardio: Heart sounds are normal with regular rate and rhythm and no murmurs, rubs or gallops. Peripheral pulses are normal and equal bilaterally without edema. No aortic or femoral bruits. Chest: symmetric with normal excursions and percussion. Breasts: Symmetric, without lumps, nipple discharge, retractions, or fibrocystic changes.  Abdomen: Flat, soft, with bowl sounds. Nontender, no guarding, rebound, hernias, masses, or organomegaly.  Lymphatics: Non tender without lymphadenopathy.  Genitourinary:  Musculoskeletal: Full ROM all peripheral extremities, joint stability, 5/5 strength, and normal gait. Skin: Warm and dry without rashes, lesions, cyanosis, clubbing or  ecchymosis.  Neuro: Cranial nerves intact, reflexes equal bilaterally.  Normal muscle tone, no cerebellar symptoms. Sensation intact.  Pysch: Awake and oriented X 3, normal affect, Insight and Judgment appropriate.   Assessment and Plan  1. Annual Screening Examination 2. Hypertension  3. Hyperlipidemia 4. Pre Diabetes 5. Vitamin D Deficiency 6. Hypothyroidism 7. Micropapillary Thyroid Cancer, pT1a, pNx 8. Pseudotumor cerebri  Continue prudent diet as discussed, weight control, BP monitoring, regular exercise, and medications. Discussed med's effects and SE's. Screening labs and tests as requested with regular follow-up as recommended.

## 2013-07-14 NOTE — Patient Instructions (Signed)

## 2013-07-15 LAB — MICROALBUMIN / CREATININE URINE RATIO
Creatinine, Urine: 33.4 mg/dL
Microalb Creat Ratio: 15 mg/g (ref 0.0–30.0)
Microalb, Ur: 0.5 mg/dL (ref 0.00–1.89)

## 2013-07-15 LAB — URINALYSIS, MICROSCOPIC ONLY
Bacteria, UA: NONE SEEN
Casts: NONE SEEN
Crystals: NONE SEEN

## 2013-07-15 LAB — VITAMIN D 25 HYDROXY (VIT D DEFICIENCY, FRACTURES): Vit D, 25-Hydroxy: 64 ng/mL (ref 30–89)

## 2013-07-15 LAB — INSULIN, FASTING: Insulin fasting, serum: 16 u[IU]/mL (ref 3–28)

## 2013-07-16 ENCOUNTER — Encounter: Payer: Self-pay | Admitting: Internal Medicine

## 2013-07-16 LAB — HEPATITIS B E ANTIBODY: Hepatitis Be Antibody: NEGATIVE

## 2013-09-02 ENCOUNTER — Other Ambulatory Visit: Payer: Self-pay

## 2013-09-02 DIAGNOSIS — Z1212 Encounter for screening for malignant neoplasm of rectum: Secondary | ICD-10-CM

## 2013-09-02 LAB — POC HEMOCCULT BLD/STL (HOME/3-CARD/SCREEN)
Card #2 Fecal Occult Blod, POC: NEGATIVE
Card #3 Fecal Occult Blood, POC: NEGATIVE
Fecal Occult Blood, POC: NEGATIVE

## 2013-10-07 ENCOUNTER — Other Ambulatory Visit: Payer: Self-pay | Admitting: Internal Medicine

## 2013-10-20 ENCOUNTER — Ambulatory Visit (INDEPENDENT_AMBULATORY_CARE_PROVIDER_SITE_OTHER): Payer: 59 | Admitting: Nurse Practitioner

## 2013-10-20 ENCOUNTER — Encounter: Payer: Self-pay | Admitting: Nurse Practitioner

## 2013-10-20 ENCOUNTER — Encounter: Payer: Self-pay | Admitting: Emergency Medicine

## 2013-10-20 ENCOUNTER — Ambulatory Visit (INDEPENDENT_AMBULATORY_CARE_PROVIDER_SITE_OTHER): Payer: 59 | Admitting: Emergency Medicine

## 2013-10-20 ENCOUNTER — Ambulatory Visit (HOSPITAL_COMMUNITY)
Admission: RE | Admit: 2013-10-20 | Discharge: 2013-10-20 | Disposition: A | Discharge status: Home / Self Care | Payer: 59 | Source: Ambulatory Visit | Attending: Emergency Medicine | Admitting: Emergency Medicine

## 2013-10-20 VITALS — BP 99/62 | HR 73 | Ht 64.0 in | Wt 159.0 lb

## 2013-10-20 VITALS — BP 119/70 | HR 64 | Temp 98.4°F | Resp 18 | Ht 63.25 in | Wt 157.0 lb

## 2013-10-20 DIAGNOSIS — E782 Mixed hyperlipidemia: Secondary | ICD-10-CM

## 2013-10-20 DIAGNOSIS — R059 Cough, unspecified: Secondary | ICD-10-CM | POA: Insufficient documentation

## 2013-10-20 DIAGNOSIS — J309 Allergic rhinitis, unspecified: Secondary | ICD-10-CM

## 2013-10-20 DIAGNOSIS — G932 Benign intracranial hypertension: Secondary | ICD-10-CM

## 2013-10-20 DIAGNOSIS — R7309 Other abnormal glucose: Secondary | ICD-10-CM

## 2013-10-20 DIAGNOSIS — R05 Cough: Secondary | ICD-10-CM

## 2013-10-20 DIAGNOSIS — I10 Essential (primary) hypertension: Secondary | ICD-10-CM

## 2013-10-20 LAB — CBC WITH DIFFERENTIAL/PLATELET
BASOS ABS: 0 10*3/uL (ref 0.0–0.1)
Basophils Relative: 0 % (ref 0–1)
Eosinophils Absolute: 0.2 10*3/uL (ref 0.0–0.7)
Eosinophils Relative: 3 % (ref 0–5)
HEMATOCRIT: 41.2 % (ref 36.0–46.0)
Hemoglobin: 13.8 g/dL (ref 12.0–15.0)
LYMPHS PCT: 23 % (ref 12–46)
Lymphs Abs: 1.2 10*3/uL (ref 0.7–4.0)
MCH: 31.2 pg (ref 26.0–34.0)
MCHC: 33.5 g/dL (ref 30.0–36.0)
MCV: 93 fL (ref 78.0–100.0)
Monocytes Absolute: 0.5 10*3/uL (ref 0.1–1.0)
Monocytes Relative: 9 % (ref 3–12)
NEUTROS ABS: 3.4 10*3/uL (ref 1.7–7.7)
Neutrophils Relative %: 65 % (ref 43–77)
PLATELETS: 301 10*3/uL (ref 150–400)
RBC: 4.43 MIL/uL (ref 3.87–5.11)
RDW: 13.3 % (ref 11.5–15.5)
WBC: 5.3 10*3/uL (ref 4.0–10.5)

## 2013-10-20 LAB — HEMOGLOBIN A1C
Hgb A1c MFr Bld: 5.6 % (ref <5.7)
MEAN PLASMA GLUCOSE: 114 mg/dL (ref <117)

## 2013-10-20 NOTE — Progress Notes (Signed)
Subjective:    Patient ID: Tammy Duran, female    DOB: [birth year], 57 y.o.   MRN: [id number]  HPI Comments: 58 yo female presents for 3 month F/U for HTN, Cholesterol, Pre-Dm, D. Deficient. She is eating healthy for the most part. She is keeping busy for exercise. Her BP has been good at home. Last labs CHOL         190   07/14/2013 HDL           80   07/14/2013 LDLCALC       95   07/14/2013 TRIG          76   07/14/2013 CHOLHDL      2.4   07/14/2013 ALT           25   07/14/2013 AST           18   07/14/2013 ALKPHOS       87   07/14/2013 BILITOT      0.5   07/14/2013 CREATININE     1.02   07/14/2013 BUN              12   07/14/2013 NA              138   07/14/2013 K               3.7   07/14/2013 CL              104   07/14/2013 CO2              26   07/14/2013 WBC      4.9   07/14/2013 HGB     14.4   07/14/2013 HCT     43.4   07/14/2013 MCV     94.6   07/14/2013 PLT      334   07/14/2013 HGBA1C      5.6   07/14/2013 + allergy increase since SUnday, with dry cough and nasal drainage. She has been taking Zyrtec and Saline NS.  Hypertension  Hyperlipidemia   Current Outpatient Prescriptions on File Prior to Visit  Medication Sig Dispense Refill  . acetaZOLAMIDE (DIAMOX) 125 MG tablet TAKE 1 TABLET BY MOUTH TWICE DAILY  60 tablet  5  . ALPRAZolam (XANAX) 0.5 MG tablet Take 0.5 mg by mouth at bedtime as needed.      Marland Kitchen aspirin 81 MG tablet Take 81 mg by mouth daily.        Marland Kitchen atenolol (TENORMIN) 100 MG tablet TAKE 1 TABLET BY MOUTH EVERY DAY FOR BLOOD PRESSURE  90 tablet  12  . Biotin 5000 MCG TABS Take by mouth daily.      Marland Kitchen buPROPion (WELLBUTRIN XL) 300 MG 24 hr tablet Take 300 mg by mouth daily. One tablet every morning      . cetirizine (ZYRTEC) 10 MG tablet Take by mouth daily.      . Cholecalciferol (VITAMIN D-3 PO) Take 1,000 Units by mouth daily.      Marland Kitchen CINNAMON PO Take by mouth daily.      Marland Kitchen estradiol (VIVELLE-DOT) 0.0375 MG/24HR Place 1 patch onto the skin 2  (two) times a week.      . levothyroxine (SYNTHROID, LEVOTHROID) 112 MCG tablet TAKE 1 TABLET BY MOUTH ONCE DAILY FOR THYROID  90 tablet  PRN  . lisinopril (PRINIVIL,ZESTRIL) 20 MG tablet Take 20 mg by mouth daily.      . Multiple Vitamins-Minerals (HAIR/SKIN/NAILS PO) Take by  mouth daily. 3 tablets daily      . Omega-3 Fatty Acids (FISH OIL) 1000 MG CAPS Take by mouth daily.      . vitamin E 400 UNIT capsule Take 400 Units by mouth daily.      Marland Kitchen ZETIA 10 MG tablet TAKE 1 TABLET BY MOUTH ONCE DAILY  90 tablet  0   No current facility-administered medications on file prior to visit.   Allergies  Allergen Reactions  . Erythromycin Nausea And Vomiting and Other (See Comments)    Diarrhea - more severe   Past Medical History  Diagnosis Date  . Hypertension   . Hyperlipidemia   . Thyroid disease   . Papilledema     bilateral  . Migraine headache   . Anxiety disorder   . History of thyroid cancer   . GERD (gastroesophageal reflux disease)   . Pseudotumor cerebri 04/19/2013  . Vitamin D deficiency   . Cancer     thyroid      Review of Systems  HENT: Positive for postnasal drip.   Respiratory: Positive for cough.   All other systems reviewed and are negative.  BP 119/70  Pulse 64  Temp(Src) 98.4 F (36.9 C) (Temporal)  Resp 18  Ht 5' 3.25" (1.607 m)  Wt 157 lb (71.215 kg)  BMI 27.58 kg/m2     Objective:   Physical Exam  Nursing note and vitals reviewed. Constitutional: She is oriented to person, place, and time. She appears well-developed and well-nourished. No distress.  HENT:  Head: Normocephalic and atraumatic.  Right Ear: External ear normal.  Left Ear: External ear normal.  Nose: Nose normal.  Mouth/Throat: Oropharynx is clear and moist. No oropharyngeal exudate.  Cobblestones posterior pharynx, Cloudy TM's bilaterally   Eyes: Conjunctivae and EOM are normal.  Neck: Normal range of motion. Neck supple. No JVD present. No thyromegaly present.  Cardiovascular:  Normal rate, regular rhythm, normal heart sounds and intact distal pulses.   Pulmonary/Chest: Effort normal and breath sounds normal.  Congested Breath sounds, clears some with cough  Abdominal: Soft. Bowel sounds are normal. She exhibits no distension and no mass. There is no tenderness. There is no rebound and no guarding.  Musculoskeletal: Normal range of motion. She exhibits no edema and no tenderness.  Lymphadenopathy:    She has no cervical adenopathy.  Neurological: She is alert and oriented to person, place, and time. No cranial nerve deficit.  Skin: Skin is warm and dry. No rash noted. No erythema. No pallor.  Psychiatric: She has a normal mood and affect. Her behavior is normal. Judgment and thought content normal.          Assessment & Plan:  1.  3 month F/U for HTN, Cholesterol, Pre-Dm, D. Deficient. Needs healthy diet, cardio QD and obtain healthy weight. Check Labs, Check BP if >130/80 call office  2. Cough/ Allergic rhinitis- Allegra OTC, increase H2o, allergy hygiene explained. CXR

## 2013-10-20 NOTE — Patient Instructions (Signed)
Allergic Rhinitis Allergic rhinitis is when the mucous membranes in the nose respond to allergens. Allergens are particles in the air that cause your body to have an allergic reaction. This causes you to release allergic antibodies. Through a chain of events, these eventually cause you to release histamine into the blood stream. Although meant to protect the body, it is this release of histamine that causes your discomfort, such as frequent sneezing, congestion, and an itchy, runny nose.  CAUSES  Seasonal allergic rhinitis (hay fever) is caused by pollen allergens that may come from grasses, trees, and weeds. Year-round allergic rhinitis (perennial allergic rhinitis) is caused by allergens such as house dust mites, pet dander, and mold spores.  SYMPTOMS   Nasal stuffiness (congestion).  Itchy, runny nose with sneezing and tearing of the eyes. DIAGNOSIS  Your health care provider can help you determine the allergen or allergens that trigger your symptoms. If you and your health care provider are unable to determine the allergen, skin or blood testing may be used. TREATMENT  Allergic Rhinitis does not have a cure, but it can be controlled by:  Medicines and allergy shots (immunotherapy).  Avoiding the allergen. Hay fever may often be treated with antihistamines in pill or nasal spray forms. Antihistamines block the effects of histamine. There are over-the-counter medicines that may help with nasal congestion and swelling around the eyes. Check with your health care provider before taking or giving this medicine.  If avoiding the allergen or the medicine prescribed do not work, there are many new medicines your health care provider can prescribe. Stronger medicine may be used if initial measures are ineffective. Desensitizing injections can be used if medicine and avoidance does not work. Desensitization is when a patient is given ongoing shots until the body becomes less sensitive to the allergen.  Make sure you follow up with your health care provider if problems continue. HOME CARE INSTRUCTIONS It is not possible to completely avoid allergens, but you can reduce your symptoms by taking steps to limit your exposure to them. It helps to know exactly what you are allergic to so that you can avoid your specific triggers. SEEK MEDICAL CARE IF:   You have a fever.  You develop a cough that does not stop easily (persistent).  You have shortness of breath.  You start wheezing.  Symptoms interfere with normal daily activities. Document Released: 03/26/2001 Document Revised: 04/21/2013 Document Reviewed: 03/08/2013 [employer] Patient Information 2014 [employer].  

## 2013-10-20 NOTE — Progress Notes (Signed)
I have read the note, and I agree with the clinical assessment and plan.  Charles K Willis   

## 2013-10-20 NOTE — Progress Notes (Signed)
[facility]  PATIENT: Tammy Duran DOB: [birth year]   REASON FOR VISIT: Followup for papilledema   HISTORY OF PRESENT ILLNESS: Tammy Duran, 58 year old female returns for followup. She was last seen by Dr. Jannifer Franklin 04/19/2013. She has a history of pseudotumor cerebri and papilledema. She says she was seen by Dr. Sanda Klein at [facility] in January 2015  and was told that her papilledema continues to improve. She is to see him again in September and he may consider taking her off of her Diamox. She denies any headache, double vision, or any muffling sounds as she has had in the past. She returns for reevaluation.   HISTORY:of migraine headaches since her teenage years. The patient was noted to have mild papilledema on an ophthalmologic evaluation. Lumbar puncture done revealed evidence of an elevated opening pressure consistent with pseudotumor cerebri, with normal spinal fluid analysis. The patient indicates that she was on an estrogen patch, but she is not on this medication currently. The patient has been placed on Diamox, and she has done better with her headaches. The patient is having on average one headache a month, and she still has some chronic neck discomfort. The patient denies any further double vision issues, or muffled hearing. The patient indicates that she was recently seen by her ophthalmologist, and she was told that the papilledema is gradually improving. The patient has been set up to see Dr. Sanda Klein at [facility] for a neuro-ophthalmologic evaluation. The patient reports some tingling in the fingers and toes with the Diamox, but she is otherwise tolerating the medication well.   REVIEW OF SYSTEMS: Full 14 system review of systems performed and notable only for those listed, all others are neg:  Constitutional: N/A  Cardiovascular: N/A  Ear/Nose/Throat: N/A  Skin: N/A  Eyes: N/A  Respiratory: N/A  Gastroitestinal: N/A  Hematology/Lymphatic: Easy  bruising Endocrine: N/A Musculoskeletal:N/A  Allergy/Immunology: Environmental allergies  Neurological: N/A Psychiatric: N/A   ALLERGIES: Allergies  Allergen Reactions  . Erythromycin Nausea And Vomiting and Other (See Comments)    Diarrhea - more severe    HOME MEDICATIONS: Outpatient Prescriptions Prior to Visit  Medication Sig Dispense Refill  . acetaZOLAMIDE (DIAMOX) 125 MG tablet TAKE 1 TABLET BY MOUTH TWICE DAILY  60 tablet  5  . ALPRAZolam (XANAX) 0.5 MG tablet Take 0.5 mg by mouth at bedtime as needed.      Marland Kitchen aspirin 81 MG tablet Take 81 mg by mouth daily.        Marland Kitchen atenolol (TENORMIN) 100 MG tablet TAKE 1 TABLET BY MOUTH EVERY DAY FOR BLOOD PRESSURE  90 tablet  12  . Biotin 5000 MCG TABS Take by mouth daily.      Marland Kitchen buPROPion (WELLBUTRIN XL) 300 MG 24 hr tablet Take 300 mg by mouth daily. One tablet every morning      . cetirizine (ZYRTEC) 10 MG tablet Take by mouth daily.      . Cholecalciferol (VITAMIN D-3 PO) Take 1,000 Units by mouth daily.      Marland Kitchen CINNAMON PO Take by mouth daily.      Marland Kitchen estradiol (VIVELLE-DOT) 0.0375 MG/24HR Place 1 patch onto the skin 2 (two) times a week.      . levothyroxine (SYNTHROID, LEVOTHROID) 112 MCG tablet TAKE 1 TABLET BY MOUTH ONCE DAILY FOR THYROID  90 tablet  PRN  . lisinopril (PRINIVIL,ZESTRIL) 20 MG tablet Take 20 mg by mouth daily.      . Multiple Vitamins-Minerals (HAIR/SKIN/NAILS PO) Take by  mouth daily. 3 tablets daily      . Omega-3 Fatty Acids (FISH OIL) 1000 MG CAPS Take by mouth daily.      . vitamin E 400 UNIT capsule Take 400 Units by mouth daily.      Marland Kitchen ZETIA 10 MG tablet TAKE 1 TABLET BY MOUTH ONCE DAILY  90 tablet  0  . atenolol (TENORMIN) 50 MG tablet Take 50 mg by mouth daily.       . predniSONE (DELTASONE) 10 MG tablet 1 po tid x 3days, 1 po bid x 3days, 1 po qd x 5 days  20 tablet  0   No facility-administered medications prior to visit.    PAST MEDICAL HISTORY: Past Medical History  Diagnosis Date  . Hypertension    . Hyperlipidemia   . Thyroid disease   . Papilledema     bilateral  . Migraine headache   . Anxiety disorder   . History of thyroid cancer   . GERD (gastroesophageal reflux disease)   . Pseudotumor cerebri 04/19/2013  . Vitamin D deficiency   . Cancer     thyroid    PAST SURGICAL HISTORY: Past Surgical History  Procedure Laterality Date  . Abdominal hysterectomy  2005  . Thyroidectomy  04/25/11    FAMILY HISTORY: Family History  Problem Relation Age of Onset  . Diabetes Mother   . Heart disease Mother   . Hypertension Mother   . Hyperlipidemia Mother   . COPD Mother   . Depression Father   . Suicidality Father   . Stroke Brother   . Heart disease Brother   . Cancer Maternal Aunt     lung  . Asthma Son     SOCIAL HISTORY: History   Social History  . Marital Status: Divorced    Spouse Name: N/A    Number of Children: 1  . Years of Education: Nursing   Occupational History  .  [employer]   Social History Main Topics  . Smoking status: Current Every Day Smoker -- 0.20 packs/day    Types: Cigarettes  . Smokeless tobacco: Never Used  . Alcohol Use: Yes     Comment: weekends  . Drug Use: No  . Sexual Activity: Not on file   Other Topics Concern  . Not on file   Social History Narrative   Patient lives at home alone.    Patient is divorced.    Patient has 1 child.    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    Patient is right handed.      PHYSICAL EXAM  Filed Vitals:   10/20/13 0843  BP: 99/62  Pulse: 73  Height: 5\' 4"  (1.626 m)  Weight: 159 lb (72.122 kg)   Body mass index is 27.28 kg/(m^2).  Generalized: Well developed, in no acute distress  Head: normocephalic and atraumatic,. Oropharynx benign  Neck: Supple, no carotid bruits  Cardiac: Regular rate rhythm, no murmur  Musculoskeletal: No deformity   Neurological examination   Mentation: Alert oriented to time, place, history taking. Follows all commands speech and  language fluent  Cranial nerve II-XII: Fundoscopic exam reveals flat disc margins.No hemorrhages are seen .Pupils were equal round reactive to light extraocular movements were full, visual field were full on confrontational test. Facial sensation and strength were normal. hearing was intact to finger rubbing bilaterally. Uvula tongue midline. head turning and shoulder shrug were normal and symmetric.Tongue protrusion into cheek strength was normal. Motor: normal bulk and tone,  full strength in the BUE, BLE, fine finger movements normal, no pronator drift. No focal weakness Coordination: finger-nose-finger, heel-to-shin bilaterally, no dysmetria Reflexes: Symmetric upper and lower but depressed plantar responses were flexor bilaterally. Gait and Station: Rising up from seated position without assistance, normal stance,  moderate stride, good arm swing, smooth turning, able to perform tiptoe, and heel walking without difficulty. Tandem gait is steady  DIAGNOSTIC DATA (LABS, IMAGING, TESTING) - I reviewed patient records, labs, notes, testing and imaging myself where available.  Lab Results  Component Value Date   WBC 4.9 07/14/2013   HGB 14.4 07/14/2013   HCT 43.4 07/14/2013   MCV 94.6 07/14/2013   PLT 334 07/14/2013      Component Value Date/Time   NA 138 07/14/2013 1413   K 3.7 07/14/2013 1413   CL 104 07/14/2013 1413   CO2 26 07/14/2013 1413   GLUCOSE 99 07/14/2013 1413   BUN 12 07/14/2013 1413   CREATININE 1.02 07/14/2013 1413   CREATININE 0.72 04/23/2011 0955   CALCIUM 9.5 07/14/2013 1413   PROT 7.7 07/14/2013 1413   ALBUMIN 4.5 07/14/2013 1413   AST 18 07/14/2013 1413   ALT 25 07/14/2013 1413   ALKPHOS 87 07/14/2013 1413   BILITOT 0.5 07/14/2013 1413   GFRNONAA 62 07/14/2013 1413   GFRNONAA >90 04/23/2011 0955   GFRAA 71 07/14/2013 1413   GFRAA >90 04/23/2011 0955   Lab Results  Component Value Date   CHOL 190 07/14/2013   HDL 80 07/14/2013   LDLCALC 95 07/14/2013    TRIG 76 07/14/2013   CHOLHDL 2.4 07/14/2013   Lab Results  Component Value Date   HGBA1C 5.6 07/14/2013   Lab Results  Component Value Date   CLEXNTZG01 749 07/14/2013   Lab Results  Component Value Date   TSH 0.211* 07/14/2013      ASSESSMENT AND PLAN  58 y.o. year old female  has a past medical history of Papilledema; Migraine headache;  Pseudotumor cerebri (04/19/2013); here to followup. She has been stable on Diamox.  Patient to continue Diamox at current dose, does not need refills Followup in 6 months Dennie Bible, [facility], [facility], [facility] [address] [place],  [address] [phone]

## 2013-10-20 NOTE — Patient Instructions (Signed)
Patient to continue Diamox at current dose does not need refills Followup in 6 months

## 2013-10-21 LAB — LIPID PANEL
CHOLESTEROL: 161 mg/dL (ref 0–200)
HDL: 64 mg/dL (ref >39)
LDL Cholesterol: 74 mg/dL (ref 0–99)
Total CHOL/HDL Ratio: 2.5 Ratio
Triglycerides: 113 mg/dL (ref <150)
VLDL: 23 mg/dL (ref 0–40)

## 2013-10-21 LAB — BASIC METABOLIC PANEL WITH GFR
BUN: 12 mg/dL (ref 6–23)
CO2: 25 meq/L (ref 19–32)
CREATININE: 1.03 mg/dL (ref 0.50–1.10)
Calcium: 8.8 mg/dL (ref 8.4–10.5)
Chloride: 109 mEq/L (ref 96–112)
GFR, Est African American: 70 mL/min
GFR, Est Non African American: 61 mL/min
Glucose, Bld: 76 mg/dL (ref 70–99)
POTASSIUM: 4.3 meq/L (ref 3.5–5.3)
Sodium: 140 mEq/L (ref 135–145)

## 2013-10-21 LAB — HEPATIC FUNCTION PANEL
ALBUMIN: 3.7 g/dL (ref 3.5–5.2)
ALK PHOS: 84 U/L (ref 39–117)
ALT: 20 U/L (ref 0–35)
AST: 17 U/L (ref 0–37)
Total Bilirubin: 0.2 mg/dL (ref 0.2–1.2)
Total Protein: 6.3 g/dL (ref 6.0–8.3)

## 2013-10-21 LAB — INSULIN, FASTING: INSULIN FASTING, SERUM: 39 u[IU]/mL — AB (ref 3–28)

## 2013-10-26 ENCOUNTER — Other Ambulatory Visit: Payer: Self-pay | Admitting: Emergency Medicine

## 2013-10-26 MED ORDER — FLUTICASONE PROPIONATE 50 MCG/ACT NA SUSP: 1.0000 | Freq: Every day | NASAL | Status: DC

## 2013-11-10 ENCOUNTER — Other Ambulatory Visit: Payer: Self-pay | Admitting: Neurology

## 2013-12-29 ENCOUNTER — Other Ambulatory Visit: Payer: Self-pay | Admitting: Physician Assistant

## 2014-01-03 ENCOUNTER — Encounter: Payer: Self-pay | Admitting: Physician Assistant

## 2014-01-03 ENCOUNTER — Ambulatory Visit (INDEPENDENT_AMBULATORY_CARE_PROVIDER_SITE_OTHER): Payer: 59 | Admitting: Physician Assistant

## 2014-01-03 VITALS — BP 142/80 | HR 84 | Temp 99.0°F | Resp 16 | Wt 157.0 lb

## 2014-01-03 DIAGNOSIS — J0100 Acute maxillary sinusitis, unspecified: Secondary | ICD-10-CM

## 2014-01-03 MED ORDER — AZITHROMYCIN 250 MG PO TABS: ORAL_TABLET | ORAL | Status: DC

## 2014-01-03 MED ORDER — PREDNISONE 20 MG PO TABS: ORAL_TABLET | ORAL | Status: DC

## 2014-01-03 NOTE — Progress Notes (Signed)
   Subjective:    Patient ID: Tammy Duran, female    DOB: [birth year], 58 y.o.   MRN: [id number]  Cough This is a new problem. Episode onset: Tuesday/Wednesday night. The problem has been gradually worsening. The cough is non-productive. Associated symptoms include a fever, nasal congestion, postnasal drip and rhinorrhea. Pertinent negatives include no chest pain, chills, ear congestion, ear pain, headaches, heartburn, hemoptysis, myalgias, rash, sore throat, shortness of breath, sweats, weight loss or wheezing. Risk factors: granddaughter sick.     Review of Systems  Constitutional: Positive for fever. Negative for chills, weight loss, diaphoresis, activity change, appetite change and fatigue.  HENT: Positive for congestion, postnasal drip, rhinorrhea, sinus pressure and voice change. Negative for ear pain, sore throat and trouble swallowing.   Respiratory: Positive for cough. Negative for hemoptysis, shortness of breath and wheezing.   Cardiovascular: Negative for chest pain.  Gastrointestinal: Negative for heartburn.  Musculoskeletal: Negative for myalgias.  Skin: Negative for rash.  Neurological: Negative for headaches.       Objective:   Physical Exam  Constitutional: She appears well-developed and well-nourished.  HENT:  Head: Normocephalic and atraumatic.  Right Ear: External ear normal.  Nose: Right sinus exhibits maxillary sinus tenderness. Right sinus exhibits no frontal sinus tenderness. Left sinus exhibits maxillary sinus tenderness. Left sinus exhibits no frontal sinus tenderness.  Eyes: Conjunctivae and EOM are normal.  Neck: Normal range of motion. Neck supple.  Cardiovascular: Normal rate, regular rhythm, normal heart sounds and intact distal pulses.   Pulmonary/Chest: Effort normal and breath sounds normal. No respiratory distress. She has no wheezes.  Abdominal: Soft. Bowel sounds are normal.  Lymphadenopathy:    She has cervical adenopathy.  Skin: Skin is warm  and dry.      Assessment & Plan:  Acute maxillary sinusitis, recurrence not specified zpak, prednisone, increase fluids, will do OTC cough

## 2014-01-03 NOTE — Patient Instructions (Signed)
Please take the prednisone to help decrease inflammation and therefore decrease symptoms. Take it it with food to avoid GI upset. It can cause increased energy but on the other hand it can make it hard to sleep at night so please take it in the morning.  It is not an antibiotic so you can stop it early if you are feeling better.  If you are diabetic it will increase your sugars.   Sinusitis Sinusitis is redness, soreness, and swelling (inflammation) of the paranasal sinuses. Paranasal sinuses are air pockets within the bones of your face (beneath the eyes, the middle of the forehead, or above the eyes). In healthy paranasal sinuses, mucus is able to drain out, and air is able to circulate through them by way of your nose. However, when your paranasal sinuses are inflamed, mucus and air can become trapped. This can allow bacteria and other germs to grow and cause infection. Sinusitis can develop quickly and last only a short time (acute) or continue over a long period (chronic). Sinusitis that lasts for more than 12 weeks is considered chronic.  CAUSES  Causes of sinusitis include:  Allergies.  Structural abnormalities, such as displacement of the cartilage that separates your nostrils (deviated septum), which can decrease the air flow through your nose and sinuses and affect sinus drainage.  Functional abnormalities, such as when the small hairs (cilia) that line your sinuses and help remove mucus do not work properly or are not present. SYMPTOMS  Symptoms of acute and chronic sinusitis are the same. The primary symptoms are pain and pressure around the affected sinuses. Other symptoms include:  Upper toothache.  Earache.  Headache.  Bad breath.  Decreased sense of smell and taste.  A cough, which worsens when you are lying flat.  Fatigue.  Fever.  Thick drainage from your nose, which often is green and may contain pus (purulent).  Swelling and warmth over the affected  sinuses. DIAGNOSIS  Your caregiver will perform a physical exam. During the exam, your caregiver may:  Look in your nose for signs of abnormal growths in your nostrils (nasal polyps).  Tap over the affected sinus to check for signs of infection.  View the inside of your sinuses (endoscopy) with a special imaging device with a light attached (endoscope), which is inserted into your sinuses. If your caregiver suspects that you have chronic sinusitis, one or more of the following tests may be recommended:  Allergy tests.  Nasal culture--A sample of mucus is taken from your nose and sent to a lab and screened for bacteria.  Nasal cytology--A sample of mucus is taken from your nose and examined by your caregiver to determine if your sinusitis is related to an allergy. TREATMENT  Most cases of acute sinusitis are related to a viral infection and will resolve on their own within 10 days. Sometimes medicines are prescribed to help relieve symptoms (pain medicine, decongestants, nasal steroid sprays, or saline sprays).  However, for sinusitis related to a bacterial infection, your caregiver will prescribe antibiotic medicines. These are medicines that will help kill the bacteria causing the infection.  Rarely, sinusitis is caused by a fungal infection. In theses cases, your caregiver will prescribe antifungal medicine. For some cases of chronic sinusitis, surgery is needed. Generally, these are cases in which sinusitis recurs more than 3 times per year, despite other treatments. HOME CARE INSTRUCTIONS   Drink plenty of water. Water helps thin the mucus so your sinuses can drain more easily.  Use  a humidifier.  Inhale steam 3 to 4 times a day (for example, sit in the bathroom with the shower running).  Apply a warm, moist washcloth to your face 3 to 4 times a day, or as directed by your caregiver.  Use saline nasal sprays to help moisten and clean your sinuses.  Take over-the-counter or  prescription medicines for pain, discomfort, or fever only as directed by your caregiver. SEEK IMMEDIATE MEDICAL CARE IF:  You have increasing pain or severe headaches.  You have nausea, vomiting, or drowsiness.  You have swelling around your face.  You have vision problems.  You have a stiff neck.  You have difficulty breathing. MAKE SURE YOU:   Understand these instructions.  Will watch your condition.  Will get help right away if you are not doing well or get worse. Document Released: 07/01/2005 Document Revised: 09/23/2011 Document Reviewed: 07/16/2011 [facility] Patient Information 2015 [place], [place]. This information is not intended to replace advice given to you by your health care provider. Make sure you discuss any questions you have with your health care provider.

## 2014-01-04 ENCOUNTER — Encounter: Payer: Self-pay | Admitting: *Deleted

## 2014-01-04 ENCOUNTER — Telehealth: Payer: Self-pay

## 2014-01-04 NOTE — Telephone Encounter (Signed)
Pt called asking for the date that FMLA forms were filled out. Please advise.

## 2014-01-21 ENCOUNTER — Ambulatory Visit (INDEPENDENT_AMBULATORY_CARE_PROVIDER_SITE_OTHER): Payer: 59 | Admitting: Internal Medicine

## 2014-01-21 ENCOUNTER — Encounter: Payer: Self-pay | Admitting: Internal Medicine

## 2014-01-21 VITALS — BP 136/76 | HR 72 | Temp 98.2°F | Resp 16 | Ht 63.25 in | Wt 150.0 lb

## 2014-01-21 DIAGNOSIS — Z79899 Other long term (current) drug therapy: Secondary | ICD-10-CM

## 2014-01-21 DIAGNOSIS — E782 Mixed hyperlipidemia: Secondary | ICD-10-CM

## 2014-01-21 DIAGNOSIS — I10 Essential (primary) hypertension: Secondary | ICD-10-CM

## 2014-01-21 DIAGNOSIS — R7309 Other abnormal glucose: Secondary | ICD-10-CM

## 2014-01-21 DIAGNOSIS — E559 Vitamin D deficiency, unspecified: Secondary | ICD-10-CM

## 2014-01-21 LAB — LIPID PANEL
CHOL/HDL RATIO: 2.4 ratio
CHOLESTEROL: 189 mg/dL (ref 0–200)
HDL: 78 mg/dL (ref >39)
LDL Cholesterol: 98 mg/dL (ref 0–99)
TRIGLYCERIDES: 65 mg/dL (ref <150)
VLDL: 13 mg/dL (ref 0–40)

## 2014-01-21 LAB — CBC WITH DIFFERENTIAL/PLATELET
BASOS PCT: 1 % (ref 0–1)
Basophils Absolute: 0 10*3/uL (ref 0.0–0.1)
Eosinophils Absolute: 0 10*3/uL (ref 0.0–0.7)
Eosinophils Relative: 1 % (ref 0–5)
HCT: 43.6 % (ref 36.0–46.0)
HEMOGLOBIN: 14.3 g/dL (ref 12.0–15.0)
LYMPHS ABS: 1.5 10*3/uL (ref 0.7–4.0)
Lymphocytes Relative: 30 % (ref 12–46)
MCH: 31 pg (ref 26.0–34.0)
MCHC: 32.8 g/dL (ref 30.0–36.0)
MCV: 94.4 fL (ref 78.0–100.0)
Monocytes Absolute: 0.2 10*3/uL (ref 0.1–1.0)
Monocytes Relative: 5 % (ref 3–12)
Neutro Abs: 3.1 10*3/uL (ref 1.7–7.7)
Neutrophils Relative %: 63 % (ref 43–77)
Platelets: 314 10*3/uL (ref 150–400)
RBC: 4.62 MIL/uL (ref 3.87–5.11)
RDW: 13.7 % (ref 11.5–15.5)
WBC: 4.9 10*3/uL (ref 4.0–10.5)

## 2014-01-21 LAB — HEPATIC FUNCTION PANEL
ALT: 25 U/L (ref 0–35)
AST: 16 U/L (ref 0–37)
Albumin: 4.3 g/dL (ref 3.5–5.2)
Alkaline Phosphatase: 84 U/L (ref 39–117)
BILIRUBIN DIRECT: 0.1 mg/dL (ref 0.0–0.3)
Indirect Bilirubin: 0.2 mg/dL (ref 0.2–1.2)
Total Bilirubin: 0.3 mg/dL (ref 0.2–1.2)
Total Protein: 7.4 g/dL (ref 6.0–8.3)

## 2014-01-21 LAB — BASIC METABOLIC PANEL WITH GFR
BUN: 14 mg/dL (ref 6–23)
CO2: 24 mEq/L (ref 19–32)
Calcium: 9.5 mg/dL (ref 8.4–10.5)
Chloride: 108 mEq/L (ref 96–112)
Creat: 0.97 mg/dL (ref 0.50–1.10)
GFR, EST AFRICAN AMERICAN: 75 mL/min
GFR, Est Non African American: 65 mL/min
Glucose, Bld: 75 mg/dL (ref 70–99)
Potassium: 4.2 mEq/L (ref 3.5–5.3)
Sodium: 141 mEq/L (ref 135–145)

## 2014-01-21 LAB — TSH: TSH: 0.522 u[IU]/mL (ref 0.350–4.500)

## 2014-01-21 LAB — HEMOGLOBIN A1C
HEMOGLOBIN A1C: 5.4 % (ref <5.7)
Mean Plasma Glucose: 108 mg/dL (ref <117)

## 2014-01-21 LAB — MAGNESIUM: Magnesium: 2.2 mg/dL (ref 1.5–2.5)

## 2014-01-21 NOTE — Progress Notes (Signed)
Patient ID: Tammy Duran, female   DOB: May 08, 1956, 58 y.o.   MRN: [id number]   This very nice 58 y.o.female presents for 6 month follow up with Hypertension,Hx/o Thyroid Cancer, Hyperlipidemia, Pre-Diabetes and Vitamin D Deficiency.    Patient also is being followed for possible Pseudotumor cerebri.   HTN predates since 42. BP has been controlled at home. Today's BP: 136/76 mmHg. Patient denies any cardiac type chest pain, palpitations, dyspnea/orthopnea/PND, dizziness, claudication, or dependent edema.   Hyperlipidemia is controlled with diet & meds. Patient denies myalgias or other med SE's. Last Lipids were   Lab Results  Component Value Date   CHOL 189 01/21/2014   HDL 78 01/21/2014   LDLCALC 98 01/21/2014   TRIG 65 01/21/2014   CHOLHDL 2.4 01/21/2014    Also, the patient has history of PreDiabetes since 2011 with A1c 5.5 & 5.9%  and last A1c was     Lab Results  Component Value Date   HGBA1C 5.4 01/21/2014   Patient denies any symptoms of reactive hypoglycemia, diabetic polys, paresthesias or visual blurring.   Further, Patient has history of Vitamin D Deficiency   and last vitamin D was 57 in Dec 2014.  Patient supplements vitamin D without any suspected side-effects.   Medication List   acetaZOLAMIDE 125 MG tablet  Commonly known as:  DIAMOX  TAKE 1 TABLET BY MOUTH TWICE DAILY     ALPRAZolam 0.5 MG tablet  Commonly known as:  XANAX  Take 0.5 mg by mouth at bedtime as needed.     aspirin 81 MG tablet  Take 81 mg by mouth daily.     atenolol 100 MG tablet  Commonly known as:  TENORMIN  TAKE 1 TABLET BY MOUTH EVERY DAY FOR BLOOD PRESSURE     Biotin 5000 MCG Tabs  Take by mouth daily.     cetirizine 10 MG tablet  Commonly known as:  ZYRTEC  Take by mouth daily.     CINNAMON PO  Take by mouth daily.     estradiol 0.0375 MG/24HR  Commonly known as:  VIVELLE-DOT  Place 1 patch onto the skin 2 (two) times a week.     Fish Oil 1000 MG Caps  Take by mouth daily.      fluticasone 50 MCG/ACT nasal spray  Commonly known as:  FLONASE  Place 1 spray into both nostrils daily.     HAIR/SKIN/NAILS PO  Take by mouth daily. 3 tablets daily     levothyroxine 112 MCG tablet  Commonly known as:  SYNTHROID, LEVOTHROID  TAKE 1 TABLET BY MOUTH ONCE DAILY FOR THYROID     lisinopril 20 MG tablet  Commonly known as:  PRINIVIL,ZESTRIL  Take 20 mg by mouth daily.     VITAMIN D-3 PO  Take 1,000 Units by mouth daily.     vitamin E 400 UNIT capsule  Take 400 Units by mouth daily.     WELLBUTRIN XL 300 MG 24 hr tablet  Generic drug:  buPROPion  Take 300 mg by mouth daily. One tablet every morning     ZETIA 10 MG tablet  Generic drug:  ezetimibe  TAKE 1 TABLET BY MOUTH ONCE DAILY       Allergies  Allergen Reactions  . Erythromycin Nausea And Vomiting and Other (See Comments)    Diarrhea - more severe   PMHx:   Past Medical History  Diagnosis Date  . Hypertension   . Hyperlipidemia   . Thyroid disease   .  Papilledema     bilateral  . Migraine headache   . Anxiety disorder   . History of thyroid cancer   . GERD (gastroesophageal reflux disease)   . Pseudotumor cerebri 04/19/2013  . Vitamin D deficiency   . Cancer     thyroid   FHx:    Reviewed / unchanged  SHx:    Reviewed / unchanged  Systems Review:  Constitutional: Denies fever, chills, wt changes, headaches, insomnia, fatigue, night sweats, change in appetite. Eyes: Denies redness, blurred vision, diplopia, discharge, itchy, watery eyes.  ENT: Denies discharge, congestion, post nasal drip, epistaxis, sore throat, earache, hearing loss, dental pain, tinnitus, vertigo, sinus pain, snoring.  CV: Denies chest pain, palpitations, irregular heartbeat, syncope, dyspnea, diaphoresis, orthopnea, PND, claudication or edema. Respiratory: denies cough, dyspnea, DOE, pleurisy, hoarseness, laryngitis, wheezing.  Gastrointestinal: Denies dysphagia, odynophagia, heartburn, reflux, water brash, abdominal  pain or cramps, nausea, vomiting, bloating, diarrhea, constipation, hematemesis, melena, hematochezia  or hemorrhoids. Genitourinary: Denies dysuria, frequency, urgency, nocturia, hesitancy, discharge, hematuria or flank pain. Musculoskeletal: Denies arthralgias, myalgias, stiffness, jt. swelling, pain, limping or strain/sprain.  Skin: Denies pruritus, rash, hives, warts, acne, eczema or change in skin lesion(s). Neuro: No weakness, tremor, incoordination, spasms, paresthesia or pain. Psychiatric: Denies confusion, memory loss or sensory loss. Endo: Denies change in weight, skin or hair change.  Heme/Lymph: No excessive bleeding, bruising or enlarged lymph nodes.  Exam:  BP 136/76  Pulse 72  Temp 98.2 F   Resp 16  Ht 5' 3.25"   Wt 150 lb   BMI 26.35 kg/m2  Appears well nourished and in no distress. Eyes: PERRLA, EOMs, conjunctiva no swelling or erythema. Sinuses: No frontal/maxillary tenderness ENT/Mouth: EAC's clear, TM's nl w/o erythema, bulging. Nares clear w/o erythema, swelling, exudates. Oropharynx clear without erythema or exudates. Oral hygiene is good. Tongue normal, non obstructing. Hearing intact.  Neck: Supple. Thyroid nl. Car 2+/2+ without bruits, nodes or JVD. Chest: Respirations nl with BS clear & equal w/o rales, rhonchi, wheezing or stridor.  Cor: Heart sounds normal w/ regular rate and rhythm without sig. murmurs, gallops, clicks, or rubs. Peripheral pulses normal and equal  without edema.  Abdomen: Soft & bowel sounds normal. Non-tender w/o guarding, rebound, hernias, masses, or organomegaly.  Lymphatics: Unremarkable.  Musculoskeletal: Full ROM all peripheral extremities, joint stability, 5/5 strength, and normal gait.  Skin: Warm, dry without exposed rashes, lesions or ecchymosis apparent.  Neuro: Cranial nerves intact, reflexes equal bilaterally. Sensory-motor testing grossly intact. Tendon reflexes grossly intact.  Pysch: Alert & oriented x 3. Insight and  judgement nl & appropriate. No ideations.  Assessment and Plan:  1. Hypertension - Continue monitor blood pressure at home. Continue diet/meds same.  2. Hyperlipidemia - Continue diet/meds, exercise,& lifestyle modifications. Continue monitor periodic cholesterol/liver & renal functions   3. Pre-Diabetes - Continue diet, exercise, lifestyle modifications. Monitor appropriate labs.  4. Vitamin D Deficiency - Continue supplementation.  Recommended regular exercise, BP monitoring, weight control, and discussed med and SE's. Recommended labs to assess and monitor clinical status. Further disposition pending results of labs.

## 2014-01-21 NOTE — Patient Instructions (Signed)

## 2014-01-22 LAB — VITAMIN D 25 HYDROXY (VIT D DEFICIENCY, FRACTURES): Vit D, 25-Hydroxy: 66 ng/mL (ref 30–89)

## 2014-01-22 LAB — INSULIN, FASTING: Insulin fasting, serum: 34 u[IU]/mL — ABNORMAL HIGH (ref 3–28)

## 2014-01-31 ENCOUNTER — Other Ambulatory Visit: Payer: Self-pay | Admitting: Internal Medicine

## 2014-03-16 ENCOUNTER — Other Ambulatory Visit: Payer: Self-pay | Admitting: Internal Medicine

## 2014-04-07 ENCOUNTER — Other Ambulatory Visit: Payer: Self-pay | Admitting: Internal Medicine

## 2014-04-07 ENCOUNTER — Encounter: Payer: Self-pay | Admitting: Internal Medicine

## 2014-04-07 DIAGNOSIS — Z1231 Encounter for screening mammogram for malignant neoplasm of breast: Secondary | ICD-10-CM

## 2014-04-21 ENCOUNTER — Ambulatory Visit (INDEPENDENT_AMBULATORY_CARE_PROVIDER_SITE_OTHER): Payer: 59 | Admitting: Nurse Practitioner

## 2014-04-21 ENCOUNTER — Ambulatory Visit (HOSPITAL_COMMUNITY)
Admission: RE | Admit: 2014-04-21 | Discharge: 2014-04-21 | Disposition: A | Discharge status: Home / Self Care | Payer: 59 | Source: Ambulatory Visit | Attending: Internal Medicine | Admitting: Internal Medicine

## 2014-04-21 ENCOUNTER — Encounter: Payer: Self-pay | Admitting: Nurse Practitioner

## 2014-04-21 VITALS — BP 129/75 | HR 70 | Ht 63.0 in | Wt 156.0 lb

## 2014-04-21 DIAGNOSIS — G932 Benign intracranial hypertension: Secondary | ICD-10-CM

## 2014-04-21 DIAGNOSIS — Z1231 Encounter for screening mammogram for malignant neoplasm of breast: Secondary | ICD-10-CM | POA: Diagnosis not present

## 2014-04-21 DIAGNOSIS — H4710 Unspecified papilledema: Secondary | ICD-10-CM

## 2014-04-21 NOTE — Progress Notes (Signed)
I have read the note, and I agree with the clinical assessment and plan.  Tammy Duran   

## 2014-04-21 NOTE — Patient Instructions (Signed)
Continue Diamox at current dose does not need refills F/U in 6 months

## 2014-04-21 NOTE — Progress Notes (Signed)
[facility]  PATIENT: Tammy Duran DOB: [birth year]   REASON FOR VISIT: Followup for papilledema    HISTORY OF PRESENT ILLNESS:Tammy Duran, 58 -old female returns for followup. She was last seen 10/20/2013. She has a history of pseudotumor cerebri and papilledema. She says she was seen by Dr. Sanda Klein at [facility] in July  2015 and was told that her papilledema continues to improve and she could discontinue her medications. She cut her Diamox back to daily and started having headaches. After 3 weeks she returned to her original dose of Diamox and her headaches stopped with in a few days. Today she denies any headache, double vision, or any muffling sounds as she has had in the past. She returns for reevaluation.   HISTORY:of migraine headaches since her teenage years. The patient was noted to have mild papilledema on an ophthalmologic evaluation. Lumbar puncture done revealed evidence of an elevated opening pressure consistent with pseudotumor cerebri, with normal spinal fluid analysis. The patient indicates that she was on an estrogen patch, but she is not on this medication currently. The patient has been placed on Diamox, and she has done better with her headaches. The patient is having on average one headache a month, and she still has some chronic neck discomfort. The patient denies any further double vision issues, or muffled hearing. The patient indicates that she was recently seen by her ophthalmologist, and she was told that the papilledema is gradually improving. The patient has been set up to see Dr. Sanda Klein at [facility] for a neuro-ophthalmologic evaluation. The patient reports some tingling in the fingers and toes with the Diamox, but she is otherwise tolerating the medication well.   REVIEW OF SYSTEMS: Full 14 system review of systems performed and notable only for those listed, all others are neg:  Constitutional: N/A  Cardiovascular: N/A  Ear/Nose/Throat: N/A    Skin: N/A  Eyes: N/A  Respiratory: N/A  Gastroitestinal: N/A  Hematology/Lymphatic: N/A  Endocrine: N/A Musculoskeletal:N/A  Allergy/Immunology: N/A  Neurological: N/A Psychiatric: Anxiety  Sleep : NA   ALLERGIES: Allergies  Allergen Reactions  . Erythromycin Nausea And Vomiting and Other (See Comments)    Diarrhea - more severe    HOME MEDICATIONS: Outpatient Prescriptions Prior to Visit  Medication Sig Dispense Refill  . acetaZOLAMIDE (DIAMOX) 125 MG tablet TAKE 1 TABLET BY MOUTH TWICE DAILY  60 tablet  6  . aspirin 81 MG tablet Take 81 mg by mouth daily.        . Biotin 5000 MCG TABS Take by mouth daily.      Marland Kitchen buPROPion (WELLBUTRIN XL) 300 MG 24 hr tablet TAKE 1 TABLET BY MOUTH ONCE DAILY  90 tablet  PRN  . cetirizine (ZYRTEC) 10 MG tablet Take 10 mg by mouth as needed.       . Cholecalciferol (VITAMIN D-3 PO) Take 1,000 Units by mouth daily.      Marland Kitchen CINNAMON PO Take by mouth daily.      Marland Kitchen estradiol (VIVELLE-DOT) 0.0375 MG/24HR Place 1 patch onto the skin 2 (two) times a week.      . levothyroxine (SYNTHROID, LEVOTHROID) 112 MCG tablet TAKE 1 TABLET BY MOUTH ONCE DAILY FOR THYROID  90 tablet  PRN  . lisinopril (PRINIVIL,ZESTRIL) 20 MG tablet TAKE 1 TABLET BY MOUTH ONCE DAILY FOR BLOOD PRESSURE  90 tablet  PRN  . Multiple Vitamins-Minerals (HAIR/SKIN/NAILS PO) Take by mouth daily. 3 tablets daily      . Omega-3 Fatty  Acids (FISH OIL) 1000 MG CAPS Take by mouth daily.      . vitamin E 400 UNIT capsule Take 400 Units by mouth daily.      Marland Kitchen ZETIA 10 MG tablet TAKE 1 TABLET BY MOUTH ONCE DAILY  90 tablet  0  . atenolol (TENORMIN) 100 MG tablet TAKE 1 TABLET BY MOUTH EVERY DAY FOR BLOOD PRESSURE  90 tablet  12  . fluticasone (FLONASE) 50 MCG/ACT nasal spray Place 1 spray into both nostrils daily.  16 g  2  . ALPRAZolam (XANAX) 0.5 MG tablet Take 0.5 mg by mouth at bedtime as needed.       No facility-administered medications prior to visit.    PAST MEDICAL HISTORY: Past  Medical History  Diagnosis Date  . Hypertension   . Hyperlipidemia   . Thyroid disease   . Papilledema     bilateral  . Migraine headache   . Anxiety disorder   . History of thyroid cancer   . GERD (gastroesophageal reflux disease)   . Pseudotumor cerebri 04/19/2013  . Vitamin D deficiency   . Cancer     thyroid    PAST SURGICAL HISTORY: Past Surgical History  Procedure Laterality Date  . Abdominal hysterectomy  2005  . Thyroidectomy  04/25/11    FAMILY HISTORY: Family History  Problem Relation Age of Onset  . Diabetes Mother   . Heart disease Mother   . Hypertension Mother   . Hyperlipidemia Mother   . COPD Mother   . Depression Father   . Suicidality Father   . Stroke Brother   . Heart disease Brother   . Cancer Maternal Aunt     lung  . Asthma Son     SOCIAL HISTORY: History   Social History  . Marital Status: Divorced    Spouse Name: N/A    Number of Children: 1  . Years of Education: Nursing   Occupational History  .  [employer]   Social History Main Topics  . Smoking status: Current Every Day Smoker -- 0.20 packs/day    Types: Cigarettes  . Smokeless tobacco: Never Used     Comment: 1-2 cigarettes per day  . Alcohol Use: Yes     Comment: weekends  . Drug Use: No  . Sexual Activity: Yes   Other Topics Concern  . Not on file   Social History Narrative   Patient lives at home alone.    Patient is divorced.    Patient has 1 child.    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    Patient is right handed.      PHYSICAL EXAM  Filed Vitals:   04/21/14 0839  BP: 129/75  Pulse: 70  Height: 5\' 3"  (1.6 m)  Weight: 156 lb (70.761 kg)   Body mass index is 27.64 kg/(m^2). Generalized: Well developed, in no acute distress  Head: normocephalic and atraumatic,. Oropharynx benign  Neck: Supple, no carotid bruits  Musculoskeletal: No deformity  Neurological examination  Mentation: Alert oriented to time, place, history taking. Follows  all commands speech and language fluent  Cranial nerve II-XII: Visual acuity 20/70 last, 20/50 right . Fundoscopic exam reveals flat disc margins..No hemorrhages are seen .Pupils were equal round reactive to light extraocular movements were full, visual field were full on confrontational test. Facial sensation and strength were normal. hearing was intact to finger rubbing bilaterally. Uvula tongue midline. head turning and shoulder shrug were normal and symmetric.Tongue protrusion into cheek strength  was normal.  Motor: normal bulk and tone, full strength in the BUE, BLE, fine finger movements normal, no pronator drift. No focal weakness  Coordination: finger-nose-finger, heel-to-shin bilaterally, no dysmetria  Reflexes: Symmetric upper and lower but depressed plantar responses were flexor bilaterally.  Gait and Station: Rising up from seated position without assistance, normal stance, moderate stride, good arm swing, smooth turning, able to perform tiptoe, and heel walking without difficulty. Tandem gait is steady   DIAGNOSTIC DATA (LABS, IMAGING, TESTING) - I reviewed patient records, labs, notes, testing and imaging myself where available.  Lab Results  Component Value Date   WBC 4.9 01/21/2014   HGB 14.3 01/21/2014   HCT 43.6 01/21/2014   MCV 94.4 01/21/2014   PLT 314 01/21/2014      Component Value Date/Time   NA 141 01/21/2014 0922   K 4.2 01/21/2014 0922   CL 108 01/21/2014 0922   CO2 24 01/21/2014 0922   GLUCOSE 75 01/21/2014 0922   BUN 14 01/21/2014 0922   CREATININE 0.97 01/21/2014 0922   CREATININE 0.72 04/23/2011 0955   CALCIUM 9.5 01/21/2014 0922   PROT 7.4 01/21/2014 0922   ALBUMIN 4.3 01/21/2014 0922   AST 16 01/21/2014 0922   ALT 25 01/21/2014 0922   ALKPHOS 84 01/21/2014 0922   BILITOT 0.3 01/21/2014 0922   GFRNONAA 65 01/21/2014 0922   GFRNONAA >90 04/23/2011 0955   GFRAA 75 01/21/2014 0922   GFRAA >90 04/23/2011 0955   Lab Results  Component Value Date   CHOL 189 01/21/2014   HDL  78 01/21/2014   LDLCALC 98 01/21/2014   TRIG 65 01/21/2014   CHOLHDL 2.4 01/21/2014   Lab Results  Component Value Date   HGBA1C 5.4 01/21/2014   Lab Results  Component Value Date   VITAMINB12 541 07/14/2013   Lab Results  Component Value Date   TSH 0.522 01/21/2014      ASSESSMENT AND PLAN  58 y.o. year old female  has a past medical history of  Papilledema; Migraine headache;  Pseudotumor cerebri (04/19/2013); here to follow up. She is stable on Diamox  Continue Diamox at current dose does not need refills F/U in 6 months Tammy Duran, [facility], [facility], [facility] [address] [place], [place] [address] [phone]

## 2014-04-29 ENCOUNTER — Other Ambulatory Visit: Payer: Self-pay

## 2014-06-30 ENCOUNTER — Encounter: Payer: Self-pay | Admitting: Internal Medicine

## 2014-07-05 ENCOUNTER — Encounter: Payer: Self-pay | Admitting: Internal Medicine

## 2014-07-06 ENCOUNTER — Other Ambulatory Visit: Payer: Self-pay | Admitting: Neurology

## 2014-07-17 ENCOUNTER — Encounter: Payer: Self-pay | Admitting: Internal Medicine

## 2014-07-17 DIAGNOSIS — Z79899 Other long term (current) drug therapy: Secondary | ICD-10-CM | POA: Insufficient documentation

## 2014-07-17 NOTE — Progress Notes (Signed)
Patient ID: Tammy Duran, female   DOB: [birth year], 59 y.o.   MRN: [id number]  Annual Comprehensive Examination  This very nice 59 y.o.WBF presents for complete physical.  Patient has been followed for HTN,  Prediabetes, Hyperlipidemia, and Vitamin D Deficiency. Patient has been diagnosed with possible Pseudotumor cerebri, but she relates now that Dr Hassell Done at [facility] in [place]  doubts the Dx now.     HTN predates since 59. Patient's BP has been controlled at home and patient denies any cardiac symptoms as chest pain, palpitations, shortness of breath, dizziness or ankle swelling. Today's BP: 122/70 mmHg    Patient's hyperlipidemia is controlled with diet and medications. Patient denies myalgias or other medication SE's. Last lipids were Total Chol 189; HDL  78; LDL  98; Trig 65 on 01/21/2014.   Patient has  prediabetes predating since May 2011 with A1c 5.8% and patient denies reactive hypoglycemic symptoms, visual blurring, diabetic polys, or paresthesias. Last A1c was 5.4% on  01/21/2014.   Patient is on thyroid replacement since a subtotal thyroidectomy for a large multinodular goiter in 2012 and did have incidental finding of a very small focus of papillary cancer on path specimens. Finally, patient has history of Vitamin D Deficiency and last Vitamin D was  66 on 01/21/2014.  Medication Sig  . acetaZOLAMIDE (DIAMOX) 125 MG tablet TAKE 1 TABLET BY MOUTH TWICE DAILY  . aspirin 81 MG tablet Take 81 mg by mouth daily.    Marland Kitchen atenolol (TENORMIN) 100 MG tablet TAKE 1/2 TABLET BY MOUTH EVERY DAY FOR BLOOD PRESSURE  . Biotin 5000 MCG TABS Take by mouth daily.  Marland Kitchen buPROPion (WELLBUTRIN XL) 300 MG 24 hr tablet TAKE 1 TABLET BY MOUTH ONCE DAILY  . cetirizine (ZYRTEC) 10 MG tablet Take 10 mg by mouth as needed.   . Cholecalciferol (VITAMIN D-3 PO) Take 1,000 Units by mouth daily.  Marland Kitchen CINNAMON PO Take by mouth daily.  . fluticasone (FLONASE) 50 MCG/ACT nasal spray Place 1 spray into both nostrils as needed.  Marland Kitchen  levothyroxine (SYNTHROID, LEVOTHROID) 112 MCG tablet TAKE 1 TABLET BY MOUTH ONCE DAILY FOR THYROID  . lisinopril (PRINIVIL,ZESTRIL) 20 MG tablet TAKE 1 TABLET BY MOUTH ONCE DAILY FOR BLOOD PRESSURE  . Multiple Vitamins-Minerals (HAIR/SKIN/NAILS PO) Take by mouth daily. 3 tablets daily  . Omega-3 Fatty Acids (FISH OIL) 1000 MG CAPS Take by mouth daily.  . vitamin E 400 UNIT capsule Take 400 Units by mouth daily.  Marland Kitchen ZETIA 10 MG tablet TAKE 1 TABLET BY MOUTH ONCE DAILY   Allergies  Allergen Reactions  . Erythromycin Nausea And Vomiting and Other (See Comments)    Diarrhea - more severe   Past Medical History  Diagnosis Date  . Hypertension   . Hyperlipidemia   . Thyroid disease   . Papilledema     bilateral  . Migraine headache   . Anxiety disorder   . History of thyroid cancer   . GERD (gastroesophageal reflux disease)   . Pseudotumor cerebri 04/19/2013  . Vitamin D deficiency   . Cancer     thyroid   Health Maintenance  Topic Date Due  . TETANUS/TDAP  10/25/1975  . INFLUENZA VACCINE  02/12/2014  . PAP SMEAR  10/09/2014  . MAMMOGRAM  04/21/2016  . COLONOSCOPY  08/20/2017   Immunization History  Administered Date(s) Administered  . DTaP 12/14/1994  . Hepatitis B 07/15/1989  . PPD Test 07/18/2014  . Pneumococcal Polysaccharide-23 10/09/1998   Past Surgical History  Procedure Laterality  Date  . Abdominal hysterectomy  2005  . Thyroidectomy  04/25/11   Family History  Problem Relation Age of Onset  . Diabetes Mother   . Heart disease Mother   . Hypertension Mother   . Hyperlipidemia Mother   . COPD Mother   . Depression Father   . Suicidality Father   . Stroke Brother   . Heart disease Brother   . Cancer Maternal Aunt     lung  . Asthma Son    History  Substance Use Topics  . Smoking status: Current Every Day Smoker -- 0.20 packs/day    Types: Cigarettes  . Smokeless tobacco: Never Used     Comment: 1-2 cigarettes per day  . Alcohol Use: Yes     Comment:  weekends    ROS Constitutional: Denies fever, chills, weight loss/gain, headaches, insomnia,night sweats and change in appetite.Does report occasional  Fatigue. Eyes: Denies redness, blurred vision, diplopia, discharge, itchy, watery eyes.  ENT: Denies discharge, congestion, post nasal drip, epistaxis, sore throat, earache, hearing loss, dental pain, Tinnitus, Vertigo, Sinus pain, snoring.  Cardio: Denies chest pain, palpitations, irregular heartbeat, syncope, dyspnea, diaphoresis, orthopnea, PND, claudication, edema Respiratory: denies cough, dyspnea, DOE, pleurisy, hoarseness, laryngitis, wheezing.  Gastrointestinal: Denies dysphagia, heartburn, reflux, water brash, pain, cramps, nausea, vomiting, bloating, diarrhea, constipation, hematemesis, melena, hematochezia, jaundice, hemorrhoids Genitourinary: Denies dysuria, frequency, urgency, nocturia, hesitancy, discharge, hematuria, flank pain Breast: Breast lumps, nipple discharge, bleeding.  Musculoskeletal: Denies arthralgia, myalgia, stiffness, Jt. Swelling, pain, limp, and strain/sprain. Denies falls. Skin: Denies puritis, rash, hives, warts, acne, eczema, changing in skin lesion Neuro: No weakness, tremor, incoordination, spasms, paresthesia, pain Psychiatric: Denies confusion, memory loss, sensory loss. Denies Depression. Endocrine: Denies change in weight, skin, hair change, nocturia, and paresthesia, diabetic polys, visual blurring, hyper / hypo glycemic episodes.  Heme/Lymph: No excessive bleeding, bruising, enlarged lymph nodes.  Physical Exam  BP 122/70 mmHg  Pulse 64  Temp(Src) 97.9 F (36.6 C)  Resp 16  Ht 5\' 3"  (1.6 m)  Wt 157 lb 3.2 oz (71.305 kg)  BMI 27.85 kg/m2  General Appearance: Well nourished and in no apparent distress. Eyes: PERRLA, EOMs, conjunctiva no swelling or erythema, normal fundi and vessels. Sinuses: No frontal/maxillary tenderness ENT/Mouth: EACs patent / TMs  nl. Nares clear without erythema,  swelling, mucoid exudates. Oral hygiene is good. No erythema, swelling, or exudate. Tongue normal, non-obstructing. Tonsils not swollen or erythematous. Hearing normal.  Neck: Supple, thyroid normal. No bruits, nodes or JVD. Respiratory: Respiratory effort normal.  BS equal and clear bilateral without rales, rhonci, wheezing or stridor. Cardio: Heart sounds are normal with regular rate and rhythm and no murmurs, rubs or gallops. Peripheral pulses are normal and equal bilaterally without edema. No aortic or femoral bruits. Chest: symmetric with normal excursions and percussion. Breasts: Deferred to GYN.  Abdomen: Flat, soft, with bowl sounds. Nontender, no guarding, rebound, hernias, masses, or organomegaly.  Lymphatics: Non tender without lymphadenopathy.  Genitourinary: Deferred to GYN. Musculoskeletal: Full ROM all peripheral extremities, joint stability, 5/5 strength, and normal gait. Skin: Warm and dry without rashes, lesions, cyanosis, clubbing or  ecchymosis.  Neuro: Cranial nerves intact, reflexes equal bilaterally. Normal muscle tone, no cerebellar symptoms. Sensation intact.  Pysch: Awake and oriented X 3, normal affect, Insight and Judgment appropriate.   Assessment and Plan  1. Hypertension  2. Hyperlipidemia 3. Pre Diabetes 4. Vitamin D Deficiency 5. Hypothyroidism, post surgical   Continue prudent diet as discussed, weight control, BP monitoring, regular exercise, and medications.  Discussed med's effects and SE's. Screening labs and tests as requested with regular follow-up as recommended.

## 2014-07-18 ENCOUNTER — Ambulatory Visit (INDEPENDENT_AMBULATORY_CARE_PROVIDER_SITE_OTHER): Payer: 59 | Admitting: Internal Medicine

## 2014-07-18 ENCOUNTER — Encounter: Payer: Self-pay | Admitting: Internal Medicine

## 2014-07-18 VITALS — BP 122/70 | HR 64 | Temp 97.9°F | Resp 16 | Ht 63.0 in | Wt 157.2 lb

## 2014-07-18 DIAGNOSIS — R7303 Prediabetes: Secondary | ICD-10-CM

## 2014-07-18 DIAGNOSIS — R5383 Other fatigue: Secondary | ICD-10-CM

## 2014-07-18 DIAGNOSIS — Z1212 Encounter for screening for malignant neoplasm of rectum: Secondary | ICD-10-CM

## 2014-07-18 DIAGNOSIS — Z79899 Other long term (current) drug therapy: Secondary | ICD-10-CM

## 2014-07-18 DIAGNOSIS — E782 Mixed hyperlipidemia: Secondary | ICD-10-CM

## 2014-07-18 DIAGNOSIS — I10 Essential (primary) hypertension: Secondary | ICD-10-CM

## 2014-07-18 DIAGNOSIS — R7309 Other abnormal glucose: Secondary | ICD-10-CM

## 2014-07-18 DIAGNOSIS — E559 Vitamin D deficiency, unspecified: Secondary | ICD-10-CM

## 2014-07-18 DIAGNOSIS — Z111 Encounter for screening for respiratory tuberculosis: Secondary | ICD-10-CM

## 2014-07-18 DIAGNOSIS — E039 Hypothyroidism, unspecified: Secondary | ICD-10-CM

## 2014-07-18 LAB — CBC WITH DIFFERENTIAL/PLATELET
BASOS ABS: 0 10*3/uL (ref 0.0–0.1)
Basophils Relative: 0 % (ref 0–1)
Eosinophils Absolute: 0 10*3/uL (ref 0.0–0.7)
Eosinophils Relative: 1 % (ref 0–5)
HEMATOCRIT: 41.9 % (ref 36.0–46.0)
HEMOGLOBIN: 14.2 g/dL (ref 12.0–15.0)
LYMPHS PCT: 26 % (ref 12–46)
Lymphs Abs: 1.3 10*3/uL (ref 0.7–4.0)
MCH: 31.5 pg (ref 26.0–34.0)
MCHC: 33.9 g/dL (ref 30.0–36.0)
MCV: 92.9 fL (ref 78.0–100.0)
MONO ABS: 0.3 10*3/uL (ref 0.1–1.0)
MONOS PCT: 6 % (ref 3–12)
MPV: 11.1 fL (ref 8.6–12.4)
NEUTROS ABS: 3.3 10*3/uL (ref 1.7–7.7)
Neutrophils Relative %: 67 % (ref 43–77)
Platelets: 317 10*3/uL (ref 150–400)
RBC: 4.51 MIL/uL (ref 3.87–5.11)
RDW: 13.6 % (ref 11.5–15.5)
WBC: 4.9 10*3/uL (ref 4.0–10.5)

## 2014-07-18 LAB — HEMOGLOBIN A1C
HEMOGLOBIN A1C: 5.7 % — AB (ref <5.7)
Mean Plasma Glucose: 117 mg/dL — ABNORMAL HIGH (ref <117)

## 2014-07-18 NOTE — Patient Instructions (Signed)
Recommend the book "The END of DIETING" by Dr Baker Janus   and the book "The END of DIABETES " by Dr Excell Seltzer  At [email or web address] - get book & Audio CD's      Being diabetic has a  300% increased risk for heart attack, stroke, cancer, and alzheimer- type vascular dementia. It is very important that you work harder with diet by avoiding all foods that are white except chicken & fish. Avoid white rice (brown & wild rice is OK), white potatoes (sweetpotatoes in moderation is OK), White bread or wheat bread or anything made out of white flour like bagels, donuts, rolls, buns, biscuits, cakes, pastries, cookies, pizza crust, and pasta (made from white flour & egg whites) - vegetarian pasta or spinach or wheat pasta is OK. Multigrain breads like Arnold's or [employer], or multigrain sandwich thins or flatbreads.  Diet, exercise and weight loss can reverse and cure diabetes in the early stages.  Diet, exercise and weight loss is very important in the control and prevention of complications of diabetes which affects every system in your body, ie. Brain - dementia/stroke, eyes - glaucoma/blindness, heart - heart attack/heart failure, kidneys - dialysis, stomach - gastric paralysis, intestines - malabsorption, nerves - severe painful neuritis, circulation - gangrene & loss of a leg(s), and finally cancer and Alzheimers.    I recommend avoid fried & greasy foods,  sweets/candy, white rice (brown or wild rice or Quinoa is OK), white potatoes (sweet potatoes are OK) - anything made from white flour - bagels, doughnuts, rolls, buns, biscuits,white and wheat breads, pizza crust and traditional pasta made of white flour & egg white(vegetarian pasta or spinach or wheat pasta is OK).  Multi-grain bread is OK - like multi-grain flat bread or sandwich thins. Avoid alcohol in excess. Exercise is also important.    Eat all the vegetables you want - avoid meat, especially red meat and dairy - especially cheese.  Cheese  is the most concentrated form of trans-fats which is the worst thing to clog up our arteries. Veggie cheese is OK which can be found in the fresh produce section at [employer] or [employer] or [employer]  Preventive Care for Adults A healthy lifestyle and preventive care can promote health and wellness. Preventive health guidelines for women include the following key practices.  A routine yearly physical is a good way to check with your health care provider about your health and preventive screening. It is a chance to share any concerns and updates on your health and to receive a thorough exam.  Visit your dentist for a routine exam and preventive care every 6 months. Brush your teeth twice a day and floss once a day. Good oral hygiene prevents tooth decay and gum disease.  The frequency of eye exams is based on your age, health, family medical history, use of contact lenses, and other factors. Follow your health care provider's recommendations for frequency of eye exams.  Eat a healthy diet. Foods like vegetables, fruits, whole grains, low-fat dairy products, and lean protein foods contain the nutrients you need without too many calories. Decrease your intake of foods high in solid fats, added sugars, and salt. Eat the right amount of calories for you.Get information about a proper diet from your health care provider, if necessary.  Regular physical exercise is one of the most important things you can do for your health. Most adults should get at least 150 minutes of moderate-intensity exercise (any activity that increases  your heart rate and causes you to sweat) each week. In addition, most adults need muscle-strengthening exercises on 2 or more days a week.  Maintain a healthy weight. The body mass index (BMI) is a screening tool to identify possible weight problems. It provides an estimate of body fat based on height and weight. Your health care provider can find your BMI and can help you  achieve or maintain a healthy weight.For adults 20 years and older:  A BMI below 18.5 is considered underweight.  A BMI of 18.5 to 24.9 is normal.  A BMI of 25 to 29.9 is considered overweight.  A BMI of 30 and above is considered obese.  Maintain normal blood lipids and cholesterol levels by exercising and minimizing your intake of saturated fat. Eat a balanced diet with plenty of fruit and vegetables. Blood tests for lipids and cholesterol should begin at age 38 and be repeated every 5 years. If your lipid or cholesterol levels are high, you are over 50, or you are at high risk for heart disease, you may need your cholesterol levels checked more frequently.Ongoing high lipid and cholesterol levels should be treated with medicines if diet and exercise are not working.  If you smoke, find out from your health care provider how to quit. If you do not use tobacco, do not start.  Lung cancer screening is recommended for adults aged 64-80 years who are at high risk for developing lung cancer because of a history of smoking. A yearly low-dose CT scan of the lungs is recommended for people who have at least a 30-pack-year history of smoking and are a current smoker or have quit within the past 15 years. A pack year of smoking is smoking an average of 1 pack of cigarettes a day for 1 year (for example: 1 pack a day for 30 years or 2 packs a day for 15 years). Yearly screening should continue until the smoker has stopped smoking for at least 15 years. Yearly screening should be stopped for people who develop a health problem that would prevent them from having lung cancer treatment.  High blood pressure causes heart disease and increases the risk of stroke. Your blood pressure should be checked at least every 1 to 2 years. Ongoing high blood pressure should be treated with medicines if weight loss and exercise do not work.  If you are 79-57 years old, ask your health care provider if you should take  aspirin to prevent strokes.  Diabetes screening involves taking a blood sample to check your fasting blood sugar level. This should be done once every 3 years, after age 37, if you are within normal weight and without risk factors for diabetes. Testing should be considered at a younger age or be carried out more frequently if you are overweight and have at least 1 risk factor for diabetes.  Breast cancer screening is essential preventive care for women. You should practice "breast self-awareness." This means understanding the normal appearance and feel of your breasts and may include breast self-examination. Any changes detected, no matter how small, should be reported to a health care provider. Women in their 76s and 30s should have a clinical breast exam (CBE) by a health care provider as part of a regular health exam every 1 to 3 years. After age 55, women should have a CBE every year. Starting at age 79, women should consider having a mammogram (breast X-ray test) every year. Women who have a family history of breast  cancer should talk to their health care provider about genetic screening. Women at a high risk of breast cancer should talk to their health care providers about having an MRI and a mammogram every year.  Breast cancer gene (BRCA)-related cancer risk assessment is recommended for women who have family members with BRCA-related cancers. BRCA-related cancers include breast, ovarian, tubal, and peritoneal cancers. Having family members with these cancers may be associated with an increased risk for harmful changes (mutations) in the breast cancer genes BRCA1 and BRCA2. Results of the assessment will determine the need for genetic counseling and BRCA1 and BRCA2 testing.  Routine pelvic exams to screen for cancer are no longer recommended for nonpregnant women who are considered low risk for cancer of the pelvic organs (ovaries, uterus, and vagina) and who do not have symptoms. Ask your health  care provider if a screening pelvic exam is right for you.  If you have had past treatment for cervical cancer or a condition that could lead to cancer, you need Pap tests and screening for cancer for at least 20 years after your treatment. If Pap tests have been discontinued, your risk factors (such as having a new sexual partner) need to be reassessed to determine if screening should be resumed. Some women have medical problems that increase the chance of getting cervical cancer. In these cases, your health care provider may recommend more frequent screening and Pap tests.  Colorectal cancer can be detected and often prevented. Most routine colorectal cancer screening begins at the age of 58 years and continues through age 44 years. However, your health care provider may recommend screening at an earlier age if you have risk factors for colon cancer. On a yearly basis, your health care provider may provide home test kits to check for hidden blood in the stool. Use of a small camera at the end of a tube, to directly examine the colon (sigmoidoscopy or colonoscopy), can detect the earliest forms of colorectal cancer. Talk to your health care provider about this at age 84, when routine screening begins. Direct exam of the colon should be repeated every 5-10 years through age 33 years, unless early forms of pre-cancerous polyps or small growths are found.  Hepatitis C blood testing is recommended for all people born from [birth year] through 1965 and any individual with known risks for hepatitis C.  Pra  Osteoporosis is a disease in which the bones lose minerals and strength with aging. This can result in serious bone fractures or breaks. The risk of osteoporosis can be identified using a bone density scan. Women ages 36 years and over and women at risk for fractures or osteoporosis should discuss screening with their health care providers. Ask your health care provider whether you should take a calcium supplement  or vitamin D to reduce the rate of osteoporosis.  Menopause can be associated with physical symptoms and risks. Hormone replacement therapy is available to decrease symptoms and risks. You should talk to your health care provider about whether hormone replacement therapy is right for you.  Use sunscreen. Apply sunscreen liberally and repeatedly throughout the day. You should seek shade when your shadow is shorter than you. Protect yourself by wearing long sleeves, pants, a wide-brimmed hat, and sunglasses year round, whenever you are outdoors.  Once a month, do a whole body skin exam, using a mirror to look at the skin on your back. Tell your health care provider of new moles, moles that have irregular borders, moles that are  larger than a pencil eraser, or moles that have changed in shape or color.  Stay current with required vaccines (immunizations).  Influenza vaccine. All adults should be immunized every year.  Tetanus, diphtheria, and acellular pertussis (Td, Tdap) vaccine. Pregnant women should receive 1 dose of Tdap vaccine during each pregnancy. The dose should be obtained regardless of the length of time since the last dose. Immunization is preferred during the 27th-36th week of gestation. An adult who has not previously received Tdap or who does not know her vaccine status should receive 1 dose of Tdap. This initial dose should be followed by tetanus and diphtheria toxoids (Td) booster doses every 10 years. Adults with an unknown or incomplete history of completing a 3-dose immunization series with Td-containing vaccines should begin or complete a primary immunization series including a Tdap dose. Adults should receive a Td booster every 10 years.  Varicella vaccine. An adult without evidence of immunity to varicella should receive 2 doses or a second dose if she has previously received 1 dose. Pregnant females who do not have evidence of immunity should receive the first dose after  pregnancy. This first dose should be obtained before leaving the health care facility. The second dose should be obtained 4-8 weeks after the first dose.  Human papillomavirus (HPV) vaccine. Females aged 13-26 years who have not received the vaccine previously should obtain the 3-dose series. The vaccine is not recommended for use in pregnant females. However, pregnancy testing is not needed before receiving a dose. If a female is found to be pregnant after receiving a dose, no treatment is needed. In that case, the remaining doses should be delayed until after the pregnancy. Immunization is recommended for any person with an immunocompromised condition through the age of 26 years if she did not get any or all doses earlier. During the 3-dose series, the second dose should be obtained 4-8 weeks after the first dose. The third dose should be obtained 24 weeks after the first dose and 16 weeks after the second dose.  Zoster vaccine. One dose is recommended for adults aged 60 years or older unless certain conditions are present.  Measles, mumps, and rubella (MMR) vaccine. Adults born before 1957 generally are considered immune to measles and mumps. Adults born in 1957 or later should have 1 or more doses of MMR vaccine unless there is a contraindication to the vaccine or there is laboratory evidence of immunity to each of the three diseases. A routine second dose of MMR vaccine should be obtained at least 28 days after the first dose for students attending postsecondary schools, health care workers, or international travelers. People who received inactivated measles vaccine or an unknown type of measles vaccine during 1963-1967 should receive 2 doses of MMR vaccine. People who received inactivated mumps vaccine or an unknown type of mumps vaccine before 1979 and are at high risk for mumps infection should consider immunization with 2 doses of MMR vaccine. For females of childbearing age, rubella immunity should  be determined. If there is no evidence of immunity, females who are not pregnant should be vaccinated. If there is no evidence of immunity, females who are pregnant should delay immunization until after pregnancy. Unvaccinated health care workers born before 1957 who lack laboratory evidence of measles, mumps, or rubella immunity or laboratory confirmation of disease should consider measles and mumps immunization with 2 doses of MMR vaccine or rubella immunization with 1 dose of MMR vaccine.  Pneumococcal 13-valent conjugate (PCV13) vaccine. When   indicated, a person who is uncertain of her immunization history and has no record of immunization should receive the PCV13 vaccine. An adult aged 73 years or older who has certain medical conditions and has not been previously immunized should receive 1 dose of PCV13 vaccine. This PCV13 should be followed with a dose of pneumococcal polysaccharide (PPSV23) vaccine. The PPSV23 vaccine dose should be obtained at least 8 weeks after the dose of PCV13 vaccine. An adult aged 81 years or older who has certain medical conditions and previously received 1 or more doses of PPSV23 vaccine should receive 1 dose of PCV13. The PCV13 vaccine dose should be obtained 1 or more years after the last PPSV23 vaccine dose.    Pneumococcal polysaccharide (PPSV23) vaccine. When PCV13 is also indicated, PCV13 should be obtained first. All adults aged 69 years and older should be immunized. An adult younger than age 35 years who has certain medical conditions should be immunized. Any person who resides in a nursing home or long-term care facility should be immunized. An adult smoker should be immunized. People with an immunocompromised condition and certain other conditions should receive both PCV13 and PPSV23 vaccines. People with human immunodeficiency virus (HIV) infection should be immunized as soon as possible after diagnosis. Immunization during chemotherapy or radiation therapy should  be avoided. Routine use of PPSV23 vaccine is not recommended for American Indians, [place]atives, or people younger than 65 years unless there are medical conditions that require PPSV23 vaccine. When indicated, people who have unknown immunization and have no record of immunization should receive PPSV23 vaccine. One-time revaccination 5 years after the first dose of PPSV23 is recommended for people aged 19-64 years who have chronic kidney failure, nephrotic syndrome, asplenia, or immunocompromised conditions. People who received 1-2 doses of PPSV23 before age 79 years should receive another dose of PPSV23 vaccine at age 73 years or later if at least 5 years have passed since the previous dose. Doses of PPSV23 are not needed for people immunized with PPSV23 at or after age 19 years.  Preventive Services / Frequency   Ages 25 to 65 years  Blood pressure check.  Lipid and cholesterol check.  Lung cancer screening. / Every year if you are aged 8-80 years and have a 30-pack-year history of smoking and currently smoke or have quit within the past 15 years. Yearly screening is stopped once you have quit smoking for at least 15 years or develop a health problem that would prevent you from having lung cancer treatment.  Clinical breast exam.** / Every year after age 28 years.  BRCA-related cancer risk assessment.** / For women who have family members with a BRCA-related cancer (breast, ovarian, tubal, or peritoneal cancers).  Mammogram.** / Every year beginning at age 57 years and continuing for as long as you are in good health. Consult with your health care provider.  Pap test.** / Every 3 years starting at age 57 years through age 25 or 13 years with a history of 3 consecutive normal Pap tests.  HPV screening.** / Every 3 years from ages 21 years through ages 60 to 48 years with a history of 3 consecutive normal Pap tests.  Fecal occult blood test (FOBT) of stool. / Every year beginning at age 77  years and continuing until age 57 years. You may not need to do this test if you get a colonoscopy every 10 years.  Flexible sigmoidoscopy or colonoscopy.** / Every 5 years for a flexible sigmoidoscopy or every 10 years  for a colonoscopy beginning at age 104 years and continuing until age 39 years.  Hepatitis C blood test.** / For all people born from [birth year] through 1965 and any individual with known risks for hepatitis C.  Skin self-exam. / Monthly.  Influenza vaccine. / Every year.  Tetanus, diphtheria, and acellular pertussis (Tdap/Td) vaccine.** / Consult your health care provider. Pregnant women should receive 1 dose of Tdap vaccine during each pregnancy. 1 dose of Td every 10 years.  Varicella vaccine.** / Consult your health care provider. Pregnant females who do not have evidence of immunity should receive the first dose after pregnancy.  Zoster vaccine.** / 1 dose for adults aged 47 years or older.  Pneumococcal 13-valent conjugate (PCV13) vaccine.** / Consult your health care provider.  Pneumococcal polysaccharide (PPSV23) vaccine.** / 1 to 2 doses if you smoke cigarettes or if you have certain conditions.  Meningococcal vaccine.** / Consult your health care provider.  Hepatitis A vaccine.** / Consult your health care provider.  Hepatitis B vaccine.** / Consult your health care provider. Screening for abdominal aortic aneurysm (AAA)  by ultrasound is recommended for people over 50 who have history of high blood pressure or who are current or former smokers.

## 2014-07-19 LAB — VITAMIN B12: Vitamin B-12: 739 pg/mL (ref 211–911)

## 2014-07-19 LAB — LIPID PANEL
CHOLESTEROL: 201 mg/dL — AB (ref 0–200)
HDL: 80 mg/dL (ref >39)
LDL Cholesterol: 108 mg/dL — ABNORMAL HIGH (ref 0–99)
Total CHOL/HDL Ratio: 2.5 Ratio
Triglycerides: 63 mg/dL (ref <150)
VLDL: 13 mg/dL (ref 0–40)

## 2014-07-19 LAB — HEPATIC FUNCTION PANEL
ALBUMIN: 4.1 g/dL (ref 3.5–5.2)
ALT: 23 U/L (ref 0–35)
AST: 16 U/L (ref 0–37)
Alkaline Phosphatase: 84 U/L (ref 39–117)
BILIRUBIN TOTAL: 0.4 mg/dL (ref 0.2–1.2)
Bilirubin, Direct: 0.1 mg/dL (ref 0.0–0.3)
Indirect Bilirubin: 0.3 mg/dL (ref 0.2–1.2)
Total Protein: 7.1 g/dL (ref 6.0–8.3)

## 2014-07-19 LAB — URINALYSIS, MICROSCOPIC ONLY
BACTERIA UA: NONE SEEN
Casts: NONE SEEN
Crystals: NONE SEEN
Squamous Epithelial / LPF: NONE SEEN

## 2014-07-19 LAB — BASIC METABOLIC PANEL WITH GFR
BUN: 13 mg/dL (ref 6–23)
CO2: 23 mEq/L (ref 19–32)
Calcium: 9.1 mg/dL (ref 8.4–10.5)
Chloride: 107 mEq/L (ref 96–112)
Creat: 0.91 mg/dL (ref 0.50–1.10)
GFR, EST AFRICAN AMERICAN: 81 mL/min
GFR, Est Non African American: 70 mL/min
Glucose, Bld: 91 mg/dL (ref 70–99)
POTASSIUM: 4.1 meq/L (ref 3.5–5.3)
Sodium: 139 mEq/L (ref 135–145)

## 2014-07-19 LAB — IRON AND TIBC
%SAT: 40 % (ref 20–55)
Iron: 132 ug/dL (ref 42–145)
TIBC: 329 ug/dL (ref 250–470)
UIBC: 197 ug/dL (ref 125–400)

## 2014-07-19 LAB — MAGNESIUM: Magnesium: 2 mg/dL (ref 1.5–2.5)

## 2014-07-19 LAB — INSULIN, FASTING: Insulin fasting, serum: 5.6 u[IU]/mL (ref 2.0–19.6)

## 2014-07-19 LAB — MICROALBUMIN / CREATININE URINE RATIO
CREATININE, URINE: 119.9 mg/dL
MICROALB UR: 0.4 mg/dL (ref <2.0)
Microalb Creat Ratio: 3.3 mg/g (ref 0.0–30.0)

## 2014-07-19 LAB — VITAMIN D 25 HYDROXY (VIT D DEFICIENCY, FRACTURES): VIT D 25 HYDROXY: 54 ng/mL (ref 30–100)

## 2014-07-19 LAB — TSH: TSH: 0.106 u[IU]/mL — AB (ref 0.350–4.500)

## 2014-07-22 ENCOUNTER — Other Ambulatory Visit: Payer: Self-pay | Admitting: *Deleted

## 2014-07-22 MED ORDER — ZETIA 10 MG PO TABS: 10.0000 mg | ORAL_TABLET | Freq: Every day | ORAL | Status: DC

## 2014-08-10 ENCOUNTER — Encounter: Payer: Self-pay | Admitting: Physician Assistant

## 2014-08-10 ENCOUNTER — Other Ambulatory Visit: Payer: Self-pay | Admitting: Internal Medicine

## 2014-08-10 DIAGNOSIS — Z85850 Personal history of malignant neoplasm of thyroid: Secondary | ICD-10-CM

## 2014-08-15 ENCOUNTER — Ambulatory Visit (HOSPITAL_COMMUNITY)
Admission: RE | Admit: 2014-08-15 | Discharge: 2014-08-15 | Disposition: A | Discharge status: Home / Self Care | Payer: 59 | Source: Ambulatory Visit | Attending: Internal Medicine | Admitting: Internal Medicine

## 2014-08-15 DIAGNOSIS — E890 Postprocedural hypothyroidism: Secondary | ICD-10-CM | POA: Diagnosis not present

## 2014-08-15 DIAGNOSIS — Z85850 Personal history of malignant neoplasm of thyroid: Secondary | ICD-10-CM | POA: Insufficient documentation

## 2014-08-31 ENCOUNTER — Ambulatory Visit: Payer: 59 | Admitting: *Deleted

## 2014-08-31 DIAGNOSIS — E039 Hypothyroidism, unspecified: Secondary | ICD-10-CM

## 2014-08-31 LAB — TSH: TSH: 1.018 u[IU]/mL (ref 0.350–4.500)

## 2014-08-31 NOTE — Progress Notes (Signed)
Patient ID: Tammy Duran, female   DOB: Sep 26, 1955, 59 y.o.   MRN: [id number] Patient presents for recheck TSH.  States she currently takes levothyroxine 112 mcg times 3 days and 1/2 a pill times 4 days.

## 2014-10-10 ENCOUNTER — Telehealth: Payer: Self-pay | Admitting: *Deleted

## 2014-10-10 NOTE — Telephone Encounter (Signed)
Spoke with patient and r/s 4/6 appointment to 11/02/14 at 8 am with  NP CM.

## 2014-10-19 ENCOUNTER — Ambulatory Visit: Payer: Self-pay | Admitting: Physician Assistant

## 2014-10-19 ENCOUNTER — Ambulatory Visit: Payer: 59 | Admitting: Nurse Practitioner

## 2014-10-19 ENCOUNTER — Encounter: Payer: Self-pay | Admitting: Internal Medicine

## 2014-10-19 ENCOUNTER — Ambulatory Visit (INDEPENDENT_AMBULATORY_CARE_PROVIDER_SITE_OTHER): Payer: 59 | Admitting: Internal Medicine

## 2014-10-19 VITALS — BP 128/70 | HR 64 | Temp 98.2°F | Resp 18 | Ht 63.0 in | Wt 160.0 lb

## 2014-10-19 DIAGNOSIS — R7303 Prediabetes: Secondary | ICD-10-CM

## 2014-10-19 DIAGNOSIS — E559 Vitamin D deficiency, unspecified: Secondary | ICD-10-CM

## 2014-10-19 DIAGNOSIS — I10 Essential (primary) hypertension: Secondary | ICD-10-CM

## 2014-10-19 DIAGNOSIS — Z79899 Other long term (current) drug therapy: Secondary | ICD-10-CM

## 2014-10-19 DIAGNOSIS — E039 Hypothyroidism, unspecified: Secondary | ICD-10-CM

## 2014-10-19 DIAGNOSIS — E782 Mixed hyperlipidemia: Secondary | ICD-10-CM

## 2014-10-19 LAB — CBC WITH DIFFERENTIAL/PLATELET
Basophils Absolute: 0 10*3/uL (ref 0.0–0.1)
Basophils Relative: 0 % (ref 0–1)
Eosinophils Absolute: 0 10*3/uL (ref 0.0–0.7)
Eosinophils Relative: 1 % (ref 0–5)
HEMATOCRIT: 45.2 % (ref 36.0–46.0)
Hemoglobin: 14.8 g/dL (ref 12.0–15.0)
LYMPHS PCT: 26 % (ref 12–46)
Lymphs Abs: 1.2 10*3/uL (ref 0.7–4.0)
MCH: 31.2 pg (ref 26.0–34.0)
MCHC: 32.7 g/dL (ref 30.0–36.0)
MCV: 95.4 fL (ref 78.0–100.0)
MPV: 11.2 fL (ref 8.6–12.4)
Monocytes Absolute: 0.3 10*3/uL (ref 0.1–1.0)
Monocytes Relative: 6 % (ref 3–12)
NEUTROS ABS: 3.2 10*3/uL (ref 1.7–7.7)
Neutrophils Relative %: 67 % (ref 43–77)
PLATELETS: 304 10*3/uL (ref 150–400)
RBC: 4.74 MIL/uL (ref 3.87–5.11)
RDW: 13.4 % (ref 11.5–15.5)
WBC: 4.8 10*3/uL (ref 4.0–10.5)

## 2014-10-19 LAB — HEMOGLOBIN A1C
Hgb A1c MFr Bld: 5.8 % — ABNORMAL HIGH (ref <5.7)
MEAN PLASMA GLUCOSE: 120 mg/dL — AB (ref <117)

## 2014-10-19 NOTE — Progress Notes (Signed)
Patient ID: Tammy Duran, female   DOB: 07-16-1955, 59 y.o.   MRN: [id number]  Assessment and Plan:  Hypertension:  -Continue medication,  -monitor blood pressure at home.  -Continue DASH diet.   -Reminder to go to the ER if any CP, SOB, nausea, dizziness, severe HA, changes vision/speech, left arm numbness and tingling, and jaw pain.  Cholesterol: -Continue diet and exercise.  -Check cholesterol.   Pre-diabetes: -Continue diet and exercise.  -Check A1C  Vitamin D Def: -check level -continue medications.   Continue diet and meds as discussed. Further disposition pending results of labs.  HPI 59 y.o. female  presents for 3 month follow up with hypertension, hyperlipidemia, prediabetes and vitamin D.   Her blood pressure has been controlled at home, today their BP is BP: 128/70 mmHg.   She does workout. She is intermittently exercising.  She likes to walk.  She does have a gym membership but doesn't go as often as she would like.   She denies chest pain, shortness of breath, dizziness.   She is on cholesterol medication and denies myalgias. Her cholesterol is not at goal. The cholesterol last visit was:   Lab Results  Component Value Date   CHOL 201* 07/18/2014   HDL 80 07/18/2014   LDLCALC 108* 07/18/2014   TRIG 63 07/18/2014   CHOLHDL 2.5 07/18/2014     She has been working on diet and exercise for prediabetes, and denies nausea, paresthesia of the feet, polydipsia, polyuria, visual disturbances and vomiting. Last A1C in the office was:  Lab Results  Component Value Date   HGBA1C 5.7* 07/18/2014    Patient is on Vitamin D supplement.  Lab Results  Component Value Date   VD25OH 69 07/18/2014     She does report that she did have some sinus infection and had to take some levaquin.  She reports that this cleared everything up.  She did have to miss a day of work.      She is due to see Dr. Hassell Done and also Dr. Posey Pronto for her eye doctor.  She is due to see Dr. Posey Pronto.    Current Medications:  Current Outpatient Prescriptions on File Prior to Visit  Medication Sig Dispense Refill  . acetaZOLAMIDE (DIAMOX) 125 MG tablet TAKE 1 TABLET BY MOUTH TWICE DAILY 60 tablet 6  . aspirin 81 MG tablet Take 81 mg by mouth daily.      Marland Kitchen atenolol (TENORMIN) 100 MG tablet TAKE 1/2 TABLET BY MOUTH EVERY DAY FOR BLOOD PRESSURE    . Biotin 5000 MCG TABS Take by mouth daily.    Marland Kitchen buPROPion (WELLBUTRIN XL) 300 MG 24 hr tablet TAKE 1 TABLET BY MOUTH ONCE DAILY 90 tablet PRN  . cetirizine (ZYRTEC) 10 MG tablet Take 10 mg by mouth as needed.     . Cholecalciferol (VITAMIN D-3 PO) Take 1,000 Units by mouth daily.    Marland Kitchen CINNAMON PO Take 1,000 mg by mouth 2 (two) times daily.     . fluticasone (FLONASE) 50 MCG/ACT nasal spray Place 1 spray into both nostrils as needed.    Marland Kitchen levothyroxine (SYNTHROID, LEVOTHROID) 112 MCG tablet TAKE 1 TABLET BY MOUTH ONCE DAILY FOR THYROID 90 tablet PRN  . lisinopril (PRINIVIL,ZESTRIL) 20 MG tablet TAKE 1 TABLET BY MOUTH ONCE DAILY FOR BLOOD PRESSURE 90 tablet PRN  . MINIVELLE 0.025 MG/24HR   3  . Multiple Vitamins-Minerals (HAIR/SKIN/NAILS PO) Take by mouth daily. 3 tablets daily    . Omega-3 Fatty Acids (  FISH OIL) 1000 MG CAPS Take by mouth daily.    . vitamin E 400 UNIT capsule Take 400 Units by mouth daily.    Marland Kitchen ZETIA 10 MG tablet Take 1 tablet (10 mg total) by mouth daily. 90 tablet 4   No current facility-administered medications on file prior to visit.    Medical History:  Past Medical History  Diagnosis Date  . Hypertension   . Hyperlipidemia   . Thyroid disease   . Papilledema     bilateral  . Migraine headache   . Anxiety disorder   . History of thyroid cancer   . GERD (gastroesophageal reflux disease)   . Pseudotumor cerebri 04/19/2013  . Vitamin D deficiency   . Cancer     thyroid    Allergies:  Allergies  Allergen Reactions  . Erythromycin Nausea And Vomiting and Other (See Comments)    Diarrhea - more severe      Review of Systems:  Review of Systems  Constitutional: Negative for fever, chills and malaise/fatigue.  HENT: Positive for congestion. Negative for ear pain, nosebleeds, sore throat and tinnitus.   Eyes: Positive for blurred vision.  Respiratory: Negative for cough, sputum production, shortness of breath and wheezing.   Cardiovascular: Negative for chest pain, palpitations and leg swelling.  Gastrointestinal: Negative for heartburn, nausea, vomiting, abdominal pain, diarrhea, constipation, blood in stool and melena.  Genitourinary: Negative for dysuria, urgency and frequency.  Musculoskeletal: Negative.   Skin: Negative.   Neurological: Positive for headaches. Negative for dizziness, tingling and tremors.    Family history- Review and unchanged  Social history- Review and unchanged  Physical Exam: BP 128/70 mmHg  Pulse 64  Temp(Src) 98.2 F (36.8 C) (Temporal)  Resp 18  Ht 5\' 3"  (1.6 m)  Wt 160 lb (72.576 kg)  BMI 28.35 kg/m2 Wt Readings from Last 3 Encounters:  10/19/14 160 lb (72.576 kg)  07/18/14 157 lb 3.2 oz (71.305 kg)  04/21/14 156 lb (70.761 kg)    General Appearance: Well nourished well developed, in no apparent distress. Eyes: PERRLA, EOMs, conjunctiva no swelling or erythema ENT/Mouth: Ear canals normal without obstruction, swelling, erythma, discharge.  TMs normal bilaterally.  Oropharynx moist, clear, without exudate, or postoropharyngeal swelling. Neck: Supple, thyroid normal,no cervical adenopathy  Respiratory: Respiratory effort normal, Breath sounds clear A&P without rhonchi, wheeze, or rale.  No retractions, no accessory usage. Cardio: RRR with no MRGs. Brisk peripheral pulses without edema.  Abdomen: Soft, + BS,  Non tender, no guarding, rebound, hernias, masses. Musculoskeletal: Full ROM, 5/5 strength, Normal gait Skin: Warm, dry without rashes, lesions, ecchymosis.  Neuro: Awake and oriented X 3, Cranial nerves intact. Normal muscle tone, no  cerebellar symptoms. Psych: Normal affect, Insight and Judgment appropriate.    FORCUCCI, Zalman Hull, PA-C 11:46 AM [facility]

## 2014-10-20 LAB — HEPATIC FUNCTION PANEL
ALBUMIN: 4.2 g/dL (ref 3.5–5.2)
ALK PHOS: 76 U/L (ref 39–117)
ALT: 23 U/L (ref 0–35)
AST: 20 U/L (ref 0–37)
Bilirubin, Direct: <0.1 mg/dL (ref 0.0–0.3)
TOTAL PROTEIN: 7.2 g/dL (ref 6.0–8.3)
Total Bilirubin: 0.4 mg/dL (ref 0.2–1.2)

## 2014-10-20 LAB — INSULIN, FASTING: INSULIN FASTING, SERUM: 7.2 u[IU]/mL (ref 2.0–19.6)

## 2014-10-20 LAB — LIPID PANEL
CHOLESTEROL: 208 mg/dL — AB (ref 0–200)
HDL: 84 mg/dL (ref >=46)
LDL Cholesterol: 110 mg/dL — ABNORMAL HIGH (ref 0–99)
Total CHOL/HDL Ratio: 2.5 Ratio
Triglycerides: 71 mg/dL (ref <150)
VLDL: 14 mg/dL (ref 0–40)

## 2014-10-20 LAB — BASIC METABOLIC PANEL WITH GFR
BUN: 14 mg/dL (ref 6–23)
CALCIUM: 9.1 mg/dL (ref 8.4–10.5)
CHLORIDE: 106 meq/L (ref 96–112)
CO2: 24 mEq/L (ref 19–32)
CREATININE: 0.92 mg/dL (ref 0.50–1.10)
GFR, Est African American: 80 mL/min
GFR, Est Non African American: 69 mL/min
Glucose, Bld: 94 mg/dL (ref 70–99)
Potassium: 4 mEq/L (ref 3.5–5.3)
Sodium: 140 mEq/L (ref 135–145)

## 2014-10-20 LAB — MAGNESIUM: Magnesium: 2 mg/dL (ref 1.5–2.5)

## 2014-10-20 LAB — TSH: TSH: 0.798 u[IU]/mL (ref 0.350–4.500)

## 2014-10-20 LAB — VITAMIN D 25 HYDROXY (VIT D DEFICIENCY, FRACTURES): Vit D, 25-Hydroxy: 49 ng/mL (ref 30–100)

## 2014-11-01 ENCOUNTER — Other Ambulatory Visit: Payer: Self-pay | Admitting: Physician Assistant

## 2014-11-02 ENCOUNTER — Ambulatory Visit (INDEPENDENT_AMBULATORY_CARE_PROVIDER_SITE_OTHER): Payer: 59 | Admitting: Nurse Practitioner

## 2014-11-02 ENCOUNTER — Encounter: Payer: Self-pay | Admitting: Nurse Practitioner

## 2014-11-02 VITALS — BP 128/76 | HR 59 | Ht 63.0 in | Wt 159.8 lb

## 2014-11-02 DIAGNOSIS — G932 Benign intracranial hypertension: Secondary | ICD-10-CM

## 2014-11-02 MED ORDER — ACETAZOLAMIDE 125 MG PO TABS: 125.0000 mg | ORAL_TABLET | Freq: Two times a day (BID) | ORAL | Status: DC

## 2014-11-02 NOTE — Progress Notes (Signed)
[facility]  PATIENT: Tammy Duran DOB: [birth year]   REASON FOR VISIT: Follow-up for papilledema, pseudotumor cerebri HISTORY FROM: Patient    HISTORY OF PRESENT ILLNESS:Ms. Tammy Duran, 59 -old female returns for followup. She was last seen 04/21/14.  She has a history of pseudotumor cerebri and papilledema. She says she was seen by Dr. Sanda Klein at [facility] has an appointment next month . Today she denies any headache, double vision, or any muffling sounds as she has had in the past. She occasionally has a sinus headache. She needs refills. Recent labs reviewed BMP which were done on 10/19/2014 She returns for reevaluation.   HISTORY:of migraine headaches since her teenage years. The patient was noted to have mild papilledema on an ophthalmologic evaluation. Lumbar puncture done revealed evidence of an elevated opening pressure consistent with pseudotumor cerebri, with normal spinal fluid analysis. The patient indicates that she was on an estrogen patch, but she is not on this medication currently. The patient has been placed on Diamox, and she has done better with her headaches. The patient is having on average one headache a month, and she still has some chronic neck discomfort. The patient denies any further double vision issues, or muffled hearing. The patient indicates that she was recently seen by her ophthalmologist, and she was told that the papilledema is gradually improving. The patient has been set up to see Dr. Sanda Klein at [facility] for a neuro-ophthalmologic evaluation. The patient reports some tingling in the fingers and toes with the Diamox, but she is otherwise tolerating the medication well.   REVIEW OF SYSTEMS: Full 14 system review of systems performed and notable only for those listed, all others are neg:  Constitutional: neg  Cardiovascular: neg Ear/Nose/Throat: neg  Skin: neg Eyes: neg Respiratory: neg Gastroitestinal: neg  Hematology/Lymphatic: Easy  bruising Endocrine: neg Musculoskeletal:neg Allergy/Immunology: Environmental allergies  Neurological: neg Psychiatric: neg Sleep : neg   ALLERGIES: Allergies  Allergen Reactions  . Erythromycin Nausea And Vomiting and Other (See Comments)    Diarrhea - more severe    HOME MEDICATIONS: Outpatient Prescriptions Prior to Visit  Medication Sig Dispense Refill  . acetaZOLAMIDE (DIAMOX) 125 MG tablet TAKE 1 TABLET BY MOUTH TWICE DAILY 60 tablet 6  . aspirin 81 MG tablet Take 81 mg by mouth daily.      Marland Kitchen atenolol (TENORMIN) 100 MG tablet TAKE 1/2 TABLET BY MOUTH EVERY DAY FOR BLOOD PRESSURE    . buPROPion (WELLBUTRIN XL) 300 MG 24 hr tablet TAKE 1 TABLET BY MOUTH ONCE DAILY 90 tablet PRN  . cetirizine (ZYRTEC) 10 MG tablet Take 10 mg by mouth as needed.     . Cholecalciferol (VITAMIN D-3 PO) Take 1,000 Units by mouth daily.    Marland Kitchen CINNAMON PO Take 1,000 mg by mouth 2 (two) times daily.     Marland Kitchen levothyroxine (SYNTHROID, LEVOTHROID) 112 MCG tablet TAKE 1 TABLET BY MOUTH ONCE DAILY FOR THYROID (Patient taking differently: TAKE 1 TABLET BY M., W, F; 1/2 TABLET Tue, Thurs, Sat., Sun.) 90 tablet 3  . lisinopril (PRINIVIL,ZESTRIL) 20 MG tablet TAKE 1 TABLET BY MOUTH ONCE DAILY FOR BLOOD PRESSURE 90 tablet PRN  . MINIVELLE 0.025 MG/24HR   3  . Multiple Vitamins-Minerals (HAIR/SKIN/NAILS PO) Take by mouth daily. 3 tablets daily    . Omega-3 Fatty Acids (FISH OIL) 1000 MG CAPS Take by mouth daily.    . vitamin E 400 UNIT capsule Take 400 Units by mouth daily.    Marland Kitchen ZETIA  10 MG tablet Take 1 tablet (10 mg total) by mouth daily. 90 tablet 4  . Biotin 5000 MCG TABS Take by mouth daily.    . fluticasone (FLONASE) 50 MCG/ACT nasal spray Place 1 spray into both nostrils as needed.     No facility-administered medications prior to visit.    PAST MEDICAL HISTORY: Past Medical History  Diagnosis Date  . Hypertension   . Hyperlipidemia   . Thyroid disease   . Papilledema     bilateral  . Migraine  headache   . Anxiety disorder   . History of thyroid cancer   . GERD (gastroesophageal reflux disease)   . Pseudotumor cerebri 04/19/2013  . Vitamin D deficiency   . Cancer     thyroid    PAST SURGICAL HISTORY: Past Surgical History  Procedure Laterality Date  . Abdominal hysterectomy  2005  . Thyroidectomy  04/25/11    FAMILY HISTORY: Family History  Problem Relation Age of Onset  . Diabetes Mother   . Heart disease Mother   . Hypertension Mother   . Hyperlipidemia Mother   . COPD Mother   . Depression Father   . Suicidality Father   . Stroke Brother   . Heart disease Brother   . Cancer Maternal Aunt     lung  . Asthma Son     SOCIAL HISTORY: History   Social History  . Marital Status: Divorced    Spouse Name: N/A  . Number of Children: 1  . Years of Education: Nursing   Occupational History  .  [employer]   Social History Main Topics  . Smoking status: Current Every Day Smoker -- 0.20 packs/day    Types: Cigarettes  . Smokeless tobacco: Never Used     Comment: 1-2 cigarettes per day  . Alcohol Use: Yes     Comment: weekends  . Drug Use: No  . Sexual Activity: Yes   Other Topics Concern  . Not on file   Social History Narrative   Patient lives at home alone.    Patient is divorced.    Patient has 1 child.    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    Patient is right handed.      PHYSICAL EXAM  Filed Vitals:   11/02/14 0800  BP: 128/76  Pulse: 59  Height: 5\' 3"  (1.6 m)  Weight: 159 lb 12.8 oz (72.485 kg)   Body mass index is 28.31 kg/(m^2). Generalized: Well developed, in no acute distress  Head: normocephalic and atraumatic,. Oropharynx benign  Neck: Supple, no carotid bruits  Musculoskeletal: No deformity  Neurological examination  Mentation: Alert oriented to time, place, history taking. Follows all commands speech and language fluent  Cranial nerve II-XII: Visual acuity 20/20 left, 20/30 right . Fundoscopic exam  reveals flat disc margins..No hemorrhages are seen .Pupils were equal round reactive to light extraocular movements were full, visual field were full on confrontational test. Facial sensation and strength were normal. hearing was intact to finger rubbing bilaterally. Uvula tongue midline. head turning and shoulder shrug were normal and symmetric.Tongue protrusion into cheek strength was normal.  Motor: normal bulk and tone, full strength in the BUE, BLE, fine finger movements normal, no pronator drift. No focal weakness  Coordination: finger-nose-finger, heel-to-shin bilaterally, no dysmetria  Reflexes: Symmetric upper and lower but depressed plantar responses were flexor bilaterally.  Gait and Station: Rising up from seated position without assistance, normal stance, moderate stride, good arm swing, smooth turning, able  to perform tiptoe, and heel walking without difficulty. Tandem gait is steady    DIAGNOSTIC DATA (LABS, IMAGING, TESTING) - I reviewed patient records, labs, notes, testing and imaging myself where available.  Lab Results  Component Value Date   WBC 4.8 10/19/2014   HGB 14.8 10/19/2014   HCT 45.2 10/19/2014   MCV 95.4 10/19/2014   PLT 304 10/19/2014      Component Value Date/Time   NA 140 10/19/2014 1145   K 4.0 10/19/2014 1145   CL 106 10/19/2014 1145   CO2 24 10/19/2014 1145   GLUCOSE 94 10/19/2014 1145   BUN 14 10/19/2014 1145   CREATININE 0.92 10/19/2014 1145   CREATININE 0.72 04/23/2011 0955   CALCIUM 9.1 10/19/2014 1145   PROT 7.2 10/19/2014 1145   ALBUMIN 4.2 10/19/2014 1145   AST 20 10/19/2014 1145   ALT 23 10/19/2014 1145   ALKPHOS 76 10/19/2014 1145   BILITOT 0.4 10/19/2014 1145   GFRNONAA 69 10/19/2014 1145   GFRNONAA >90 04/23/2011 0955   GFRAA 80 10/19/2014 1145   GFRAA >90 04/23/2011 0955   Lab Results  Component Value Date   CHOL 208* 10/19/2014   HDL 84 10/19/2014   LDLCALC 110* 10/19/2014   TRIG 71 10/19/2014   CHOLHDL 2.5 10/19/2014    Lab Results  Component Value Date   HGBA1C 5.8* 10/19/2014   Lab Results  Component Value Date   VITAMINB12 739 07/18/2014   Lab Results  Component Value Date   TSH 0.798 10/19/2014      ASSESSMENT AND PLAN  59 y.o. year old female  has a past medical history of  Papilledema; Migraine headache; Pseudotumor cerebri (04/19/2013);here to follow-up. Her symptoms are stable on Diamox  Continue Diamox at current dose will refill Call for any increase in headaches Follow-up in 8 months Dennie Bible, [facility], [facility], [facility] [address] [place], [place] [address] [phone]

## 2014-11-02 NOTE — Patient Instructions (Signed)
Continue Diamox at current dose will refill Call for any increase in headaches Follow-up in 8 months

## 2014-11-02 NOTE — Progress Notes (Signed)
I have read the note, and I agree with the clinical assessment and plan.  Lavinia Mcneely KEITH   

## 2015-02-01 ENCOUNTER — Encounter: Payer: Self-pay | Admitting: Internal Medicine

## 2015-02-01 ENCOUNTER — Ambulatory Visit (INDEPENDENT_AMBULATORY_CARE_PROVIDER_SITE_OTHER): Payer: 59 | Admitting: Internal Medicine

## 2015-02-01 VITALS — BP 138/74 | HR 64 | Temp 97.3°F | Resp 16 | Ht 63.0 in | Wt 157.0 lb

## 2015-02-01 DIAGNOSIS — Z6828 Body mass index (BMI) 28.0-28.9, adult: Secondary | ICD-10-CM

## 2015-02-01 DIAGNOSIS — E782 Mixed hyperlipidemia: Secondary | ICD-10-CM

## 2015-02-01 DIAGNOSIS — R7309 Other abnormal glucose: Secondary | ICD-10-CM

## 2015-02-01 DIAGNOSIS — R7303 Prediabetes: Secondary | ICD-10-CM

## 2015-02-01 DIAGNOSIS — I10 Essential (primary) hypertension: Secondary | ICD-10-CM

## 2015-02-01 DIAGNOSIS — E559 Vitamin D deficiency, unspecified: Secondary | ICD-10-CM

## 2015-02-01 DIAGNOSIS — E039 Hypothyroidism, unspecified: Secondary | ICD-10-CM

## 2015-02-01 DIAGNOSIS — Z79899 Other long term (current) drug therapy: Secondary | ICD-10-CM

## 2015-02-01 LAB — HEMOGLOBIN A1C
Hgb A1c MFr Bld: 5.5 % (ref <5.7)
Mean Plasma Glucose: 111 mg/dL (ref <117)

## 2015-02-01 LAB — CBC WITH DIFFERENTIAL/PLATELET
BASOS PCT: 1 % (ref 0–1)
Basophils Absolute: 0 10*3/uL (ref 0.0–0.1)
Eosinophils Absolute: 0.1 10*3/uL (ref 0.0–0.7)
Eosinophils Relative: 3 % (ref 0–5)
HCT: 43.2 % (ref 36.0–46.0)
Hemoglobin: 14.2 g/dL (ref 12.0–15.0)
LYMPHS ABS: 1.5 10*3/uL (ref 0.7–4.0)
LYMPHS PCT: 30 % (ref 12–46)
MCH: 31.4 pg (ref 26.0–34.0)
MCHC: 32.9 g/dL (ref 30.0–36.0)
MCV: 95.6 fL (ref 78.0–100.0)
MONOS PCT: 3 % (ref 3–12)
MPV: 11.2 fL (ref 8.6–12.4)
Monocytes Absolute: 0.1 10*3/uL (ref 0.1–1.0)
NEUTROS PCT: 63 % (ref 43–77)
Neutro Abs: 3.1 10*3/uL (ref 1.7–7.7)
Platelets: 280 10*3/uL (ref 150–400)
RBC: 4.52 MIL/uL (ref 3.87–5.11)
RDW: 13.1 % (ref 11.5–15.5)
WBC: 4.9 10*3/uL (ref 4.0–10.5)

## 2015-02-01 LAB — BASIC METABOLIC PANEL WITH GFR
BUN: 18 mg/dL (ref 6–23)
CO2: 20 meq/L (ref 19–32)
CREATININE: 0.96 mg/dL (ref 0.50–1.10)
Calcium: 8.8 mg/dL (ref 8.4–10.5)
Chloride: 108 mEq/L (ref 96–112)
GFR, EST NON AFRICAN AMERICAN: 65 mL/min
GFR, Est African American: 75 mL/min
Glucose, Bld: 101 mg/dL — ABNORMAL HIGH (ref 70–99)
POTASSIUM: 3.8 meq/L (ref 3.5–5.3)
Sodium: 140 mEq/L (ref 135–145)

## 2015-02-01 LAB — LIPID PANEL
Cholesterol: 183 mg/dL (ref 0–200)
HDL: 82 mg/dL (ref >=46)
LDL Cholesterol: 86 mg/dL (ref 0–99)
Total CHOL/HDL Ratio: 2.2 Ratio
Triglycerides: 77 mg/dL (ref <150)
VLDL: 15 mg/dL (ref 0–40)

## 2015-02-01 LAB — HEPATIC FUNCTION PANEL
ALBUMIN: 4.2 g/dL (ref 3.5–5.2)
ALK PHOS: 78 U/L (ref 39–117)
ALT: 23 U/L (ref 0–35)
AST: 17 U/L (ref 0–37)
BILIRUBIN DIRECT: 0.1 mg/dL (ref 0.0–0.3)
Indirect Bilirubin: 0.3 mg/dL (ref 0.2–1.2)
Total Bilirubin: 0.4 mg/dL (ref 0.2–1.2)
Total Protein: 7.1 g/dL (ref 6.0–8.3)

## 2015-02-01 LAB — TSH: TSH: 1.331 u[IU]/mL (ref 0.350–4.500)

## 2015-02-01 LAB — MAGNESIUM: MAGNESIUM: 2.2 mg/dL (ref 1.5–2.5)

## 2015-02-01 MED ORDER — LISINOPRIL-HYDROCHLOROTHIAZIDE 20-12.5 MG PO TABS: ORAL_TABLET | ORAL | Status: DC

## 2015-02-01 NOTE — Progress Notes (Signed)
Patient ID: Tammy Duran, female   DOB: [birth year], 59 y.o.   MRN: [id number]   This very nice 59 y.o. DBF presents for 3 month follow up with Hypertension, Hyperlipidemia, Pre-Diabetes and Vitamin D Deficiency.    Patient is treated for HTN since 1992 & BP has been controlled at home. Today's BP: 138/74 mmHg. Patient has had no complaints of any cardiac type chest pain, palpitations, dyspnea/orthopnea/PND, dizziness, claudication, or dependent edema. Patient has had a ? 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 Patient plans to d/c her Diamor and asks for Hctz to be added to her Lisinopril.   Hyperlipidemia is controlled with diet & meds. Patient denies myalgias or other med SE's. Last Lipids were  Cholesterol 208*; HDL 84; LDL 110*; Triglycerides 71 on 10/19/2014.   Also, the patient has history of PreDiabetes since May 2011 with A1c 5.8% and has had no symptoms of reactive hypoglycemia, diabetic polys, paresthesias or visual blurring.  Last A1c was 5.8% on  10/19/2014.   Further, the patient also has history of Vitamin D Deficiency of 33 in 2008 and supplements vitamin D without any suspected side-effects. Last vitamin D was 49 on 10/19/2014.  Medication Sig  . acetaZOLAMIDE (DIAMOX) 125 MG tablet Take 1 tablet (125 mg total) by mouth 2 (two) times daily.  Marland Kitchen aspirin 81 MG tablet Take 81 mg by mouth daily.    Marland Kitchen atenolol  100 MG tablet TAKE 1/2 TABLET BY MOUTH EVERY DAY FOR BLOOD PRESSURE  . Biotin 5000 MCG TABS Take by mouth daily.  Marland Kitchen buPROPion  XL 300 MG 24 hr tablet TAKE 1 TABLET BY MOUTH ONCE DAILY  . ZYRTEC 10 MG tablet Take 10 mg by mouth as needed.   Marland Kitchen VITAMIN D-3  Take 1,000 Units by mouth daily.  Marland Kitchen CINNAMON  Take 1,000 mg by mouth 2 (two) times daily.   Marland Kitchen FLONASE 50  nasal spray Place 1 spray into both nostrils as needed.  Marland Kitchen levothyroxine  112 MCG  tablet  TAKE 1 TABLET BY M., W, F; 1/2 TABLET Tue, Thurs, Sat., Sun.)  . lisinopril  20 MG tablet TAKE 1 TABLET BY MOUTH ONCE DAILY FOR BLOOD PRESSURE  . MINIVELLE 0.025 MG/24HR   . HAIR/SKIN/NAILS  Take by mouth daily. 3 tablets daily  . FISH OIL 1000 MG CAPS Take by mouth daily.  . vitamin E 400 UNIT capsule Take 400 Units by mouth daily.  Marland Kitchen ZETIA 10 MG tablet Take 1 tablet (10 mg total) by mouth daily.   Allergies  Allergen Reactions  . Erythromycin Nausea And Vomiting and Other (See Comments)    Diarrhea - more severe   PMHx:   Past Medical History  Diagnosis Date  . Hypertension   . Hyperlipidemia   . Thyroid disease   . Papilledema     bilateral  . Migraine headache   . Anxiety disorder   . History of thyroid cancer   . GERD (gastroesophageal reflux disease)   . Pseudotumor cerebri 04/19/2013  . Vitamin D deficiency   . Cancer     thyroid   Immunization History  Administered Date(s) Administered  . DTaP 12/14/1994  . Hepatitis B 07/15/1989  . PPD Test 07/18/2014  . Pneumococcal Polysaccharide-23 10/09/1998   Past Surgical History  Procedure Laterality Date  . Abdominal hysterectomy  2005  . Thyroidectomy  04/25/11   FHx:    Reviewed / unchanged  SHx:    Reviewed / unchanged  Systems Review:  Constitutional: Denies fever, chills, wt changes, headaches, insomnia, fatigue, night sweats, change in appetite. Eyes: Denies redness, blurred vision, diplopia, discharge, itchy, watery eyes.  ENT: Denies discharge, congestion, post nasal drip, epistaxis, sore throat, earache, hearing loss, dental pain, tinnitus, vertigo, sinus pain, snoring.  CV: Denies chest pain, palpitations, irregular heartbeat, syncope, dyspnea, diaphoresis, orthopnea, PND, claudication or edema. Respiratory: denies cough, dyspnea, DOE, pleurisy, hoarseness, laryngitis, wheezing.  Gastrointestinal: Denies dysphagia, odynophagia, heartburn, reflux, water brash, abdominal pain or cramps, nausea,  vomiting, bloating, diarrhea, constipation, hematemesis, melena, hematochezia  or hemorrhoids. Genitourinary: Denies dysuria, frequency, urgency, nocturia, hesitancy, discharge, hematuria or flank pain. Musculoskeletal: Denies arthralgias, myalgias, stiffness, jt. swelling, pain, limping or strain/sprain.  Skin: Denies pruritus, rash, hives, warts, acne, eczema or change in skin lesion(s). Neuro: No weakness, tremor, incoordination, spasms, paresthesia or pain. Psychiatric: Denies confusion, memory loss or sensory loss. Endo: Denies change in weight, skin or hair change.  Heme/Lymph: No excessive bleeding, bruising or enlarged lymph nodes.  Physical Exam  BP 138/74   Pulse 64  Temp 97.3 F   Resp 16  Ht 5\' 3"    Wt 157 lb     BMI 27.82   Appears well nourished and in no distress. Eyes: PERRLA, EOMs, conjunctiva no swelling or erythema. Sinuses: No frontal/maxillary tenderness ENT/Mouth: EAC's clear, TM's nl w/o erythema, bulging. Nares clear w/o erythema, swelling, exudates. Oropharynx clear without erythema or exudates. Oral hygiene is good. Tongue normal, non obstructing. Hearing intact.  Neck: Supple. Thyroid nl. Car 2+/2+ without bruits, nodes or JVD. Chest: Respirations nl with BS clear & equal w/o rales, rhonchi, wheezing or stridor.  Cor: Heart sounds normal w/ regular rate and rhythm without sig. murmurs, gallops, clicks, or rubs. Peripheral pulses normal and equal  without edema.  Abdomen: Soft & bowel sounds normal. Non-tender w/o guarding, rebound, hernias, masses, or organomegaly.  Lymphatics: Unremarkable.  Musculoskeletal: Full ROM all peripheral extremities, joint stability, 5/5 strength, and normal gait.  Skin: Warm, dry without exposed rashes, lesions or ecchymosis apparent.  Neuro: Cranial nerves intact, reflexes equal bilaterally. Sensory-motor testing grossly intact. Tendon reflexes grossly intact.  Pysch: Alert & oriented x 3.  Insight and judgement nl &  appropriate. No ideations.  Assessment and Plan: 1. Essential hypertension  - d/c Diamox - Rx Lisinopril/hctz 20/12.5 - TSH  2. Hyperlipidemia  - Lipid panel  3. Prediabetes  - Hemoglobin A1c - Insulin, random  4. Vitamin D deficiency  - Vit D  25 hydroxy   5. Hypothyroidism   6. Medication management  - CBC with Differential/Platelet - BASIC METABOLIC PANEL WITH GFR - Hepatic function panel - Magnesium  7. BMI 28.0-28.9   Recommended regular exercise, BP monitoring, weight control, and discussed med and SE's. Recommended labs to assess and monitor clinical status. Further disposition pending results of labs. Over 30 minutes of exam, counseling, chart review was performed

## 2015-02-01 NOTE — Patient Instructions (Signed)

## 2015-02-02 LAB — INSULIN, RANDOM: INSULIN: 9.7 u[IU]/mL (ref 2.0–19.6)

## 2015-02-02 LAB — VITAMIN D 25 HYDROXY (VIT D DEFICIENCY, FRACTURES): Vit D, 25-Hydroxy: 42 ng/mL (ref 30–100)

## 2015-02-28 ENCOUNTER — Other Ambulatory Visit: Payer: Self-pay | Admitting: Emergency Medicine

## 2015-02-28 ENCOUNTER — Other Ambulatory Visit: Payer: Self-pay | Admitting: Physician Assistant

## 2015-02-28 MED ORDER — FLUTICASONE PROPIONATE 50 MCG/ACT NA SUSP: 1.0000 | NASAL | Status: DC | PRN

## 2015-02-28 MED ORDER — ATENOLOL 100 MG PO TABS: ORAL_TABLET | ORAL | Status: DC

## 2015-03-08 ENCOUNTER — Encounter: Payer: Self-pay | Admitting: Physician Assistant

## 2015-03-08 DIAGNOSIS — I10 Essential (primary) hypertension: Secondary | ICD-10-CM

## 2015-03-08 MED ORDER — LISINOPRIL-HYDROCHLOROTHIAZIDE 20-25 MG PO TABS: 1.0000 | ORAL_TABLET | Freq: Every day | ORAL | Status: DC

## 2015-05-03 ENCOUNTER — Ambulatory Visit (INDEPENDENT_AMBULATORY_CARE_PROVIDER_SITE_OTHER): Payer: 59 | Admitting: Physician Assistant

## 2015-05-03 ENCOUNTER — Encounter: Payer: Self-pay | Admitting: Physician Assistant

## 2015-05-03 VITALS — BP 124/66 | HR 63 | Temp 97.0°F | Resp 14 | Ht 63.0 in | Wt 161.0 lb

## 2015-05-03 DIAGNOSIS — E559 Vitamin D deficiency, unspecified: Secondary | ICD-10-CM | POA: Diagnosis not present

## 2015-05-03 DIAGNOSIS — Z23 Encounter for immunization: Secondary | ICD-10-CM

## 2015-05-03 DIAGNOSIS — R7303 Prediabetes: Secondary | ICD-10-CM | POA: Diagnosis not present

## 2015-05-03 DIAGNOSIS — E039 Hypothyroidism, unspecified: Secondary | ICD-10-CM | POA: Diagnosis not present

## 2015-05-03 DIAGNOSIS — E782 Mixed hyperlipidemia: Secondary | ICD-10-CM | POA: Diagnosis not present

## 2015-05-03 DIAGNOSIS — Z79899 Other long term (current) drug therapy: Secondary | ICD-10-CM

## 2015-05-03 DIAGNOSIS — I10 Essential (primary) hypertension: Secondary | ICD-10-CM

## 2015-05-03 LAB — CBC WITH DIFFERENTIAL/PLATELET
BASOS ABS: 0 10*3/uL (ref 0.0–0.1)
Basophils Relative: 0 % (ref 0–1)
EOS ABS: 0.1 10*3/uL (ref 0.0–0.7)
Eosinophils Relative: 1 % (ref 0–5)
HEMATOCRIT: 41.8 % (ref 36.0–46.0)
Hemoglobin: 13.7 g/dL (ref 12.0–15.0)
Lymphocytes Relative: 22 % (ref 12–46)
Lymphs Abs: 1.3 10*3/uL (ref 0.7–4.0)
MCH: 31.6 pg (ref 26.0–34.0)
MCHC: 32.8 g/dL (ref 30.0–36.0)
MCV: 96.5 fL (ref 78.0–100.0)
MPV: 11.6 fL (ref 8.6–12.4)
Monocytes Absolute: 0.4 10*3/uL (ref 0.1–1.0)
Monocytes Relative: 7 % (ref 3–12)
NEUTROS PCT: 70 % (ref 43–77)
Neutro Abs: 4.1 10*3/uL (ref 1.7–7.7)
Platelets: 284 10*3/uL (ref 150–400)
RBC: 4.33 MIL/uL (ref 3.87–5.11)
RDW: 13.2 % (ref 11.5–15.5)
WBC: 5.9 10*3/uL (ref 4.0–10.5)

## 2015-05-03 LAB — HEMOGLOBIN A1C
Hgb A1c MFr Bld: 5.6 % (ref <5.7)
Mean Plasma Glucose: 114 mg/dL (ref <117)

## 2015-05-03 NOTE — Progress Notes (Signed)
Assessment and Plan:  1. Hypertension -Continue medication, monitor blood pressure at home. Continue DASH diet.  Reminder to go to the ER if any CP, SOB, nausea, dizziness, severe HA, changes vision/speech, left arm numbness and tingling and jaw pain.  2. Cholesterol -Continue diet and exercise. Check cholesterol.   3. Prediabetes  -Continue diet and exercise. Check A1C  4. Vitamin D Def - check level and continue medications.   5. Hypothyroidism, unspecified hypothyroidism type - TSH  6. Need for prophylactic vaccination with combined diphtheria-tetanus-pertussis (DTP) vaccine - Tdap vaccine greater than or equal to 7yo IM   Continue diet and meds as discussed. Further disposition pending results of labs. Over 30 minutes of exam, counseling, chart review, and critical decision making was performed  HPI 59 y.o. female  presents for 3 month follow up on hypertension, cholesterol, prediabetes, and vitamin D deficiency.   Her blood pressure has been controlled at home, today their BP is BP: 124/66 mmHg  She does not workout regularly.  She denies chest pain, shortness of breath, dizziness. She has history of psudotumor cerebri, reaffirmed by optho in [place], is on HCTZ 25mg  with lisinopril 20, no HA, changes in vision.  Due for TDAP.   She is on cholesterol medication and denies myalgias. Her cholesterol is at goal. The cholesterol last visit was:   Lab Results  Component Value Date   CHOL 183 02/01/2015   HDL 82 02/01/2015   LDLCALC 86 02/01/2015   TRIG 77 02/01/2015   CHOLHDL 2.2 02/01/2015    She has been working on diet and exercise for prediabetes, has had x 2011, and denies paresthesia of the feet, polydipsia, polyuria and visual disturbances. Last A1C in the office was:  Lab Results  Component Value Date   HGBA1C 5.5 02/01/2015   Patient is on Vitamin D supplement.   Lab Results  Component Value Date   VD25OH 42 02/01/2015     She is on thyroid medication. Her  medication was not changed last visit.   Lab Results  Component Value Date   TSH 1.331 02/01/2015  .  BMI is Body mass index is 28.53 kg/(m^2)., she is working on diet and exercise. Wt Readings from Last 3 Encounters:  05/03/15 161 lb (73.029 kg)  02/01/15 157 lb (71.215 kg)  11/02/14 159 lb 12.8 oz (72.485 kg)   Current Medications:  Current Outpatient Prescriptions on File Prior to Visit  Medication Sig Dispense Refill  . aspirin 81 MG tablet Take 81 mg by mouth daily.      Marland Kitchen atenolol (TENORMIN) 100 MG tablet 1/2-1 pill for blood pressure daily 90 tablet 0  . Biotin 5000 MCG TABS Take by mouth daily.    Marland Kitchen buPROPion (WELLBUTRIN XL) 300 MG 24 hr tablet TAKE 1 TABLET BY MOUTH ONCE DAILY 90 tablet PRN  . cetirizine (ZYRTEC) 10 MG tablet Take 10 mg by mouth as needed.     . Cholecalciferol (VITAMIN D-3 PO) Take 1,000 Units by mouth daily.    Marland Kitchen CINNAMON PO Take 1,000 mg by mouth 2 (two) times daily.     . fluticasone (FLONASE) 50 MCG/ACT nasal spray Place 1 spray into both nostrils as needed. 16 g 2  . levothyroxine (SYNTHROID, LEVOTHROID) 112 MCG tablet TAKE 1 TABLET BY MOUTH ONCE DAILY FOR THYROID (Patient taking differently: TAKE 1 TABLET BY M., W, F; 1/2 TABLET Tue, Thurs, Sat., Sun.) 90 tablet 3  . lisinopril-hydrochlorothiazide (PRINZIDE,ZESTORETIC) 20-25 MG per tablet Take 1 tablet by mouth  daily. 90 tablet 0  . MINIVELLE 0.025 MG/24HR   3  . Multiple Vitamins-Minerals (HAIR/SKIN/NAILS PO) Take by mouth daily. 3 tablets daily    . Omega-3 Fatty Acids (FISH OIL) 1000 MG CAPS Take by mouth daily.    . vitamin E 400 UNIT capsule Take 400 Units by mouth daily.    Marland Kitchen ZETIA 10 MG tablet Take 1 tablet (10 mg total) by mouth daily. 90 tablet 4   No current facility-administered medications on file prior to visit.   Medical History:  Past Medical History  Diagnosis Date  . Hypertension   . Hyperlipidemia   . Thyroid disease   . Papilledema     bilateral  . Migraine headache   .  Anxiety disorder   . History of thyroid cancer   . GERD (gastroesophageal reflux disease)   . Pseudotumor cerebri 04/19/2013  . Vitamin D deficiency   . Cancer [facility])     thyroid   Allergies:  Allergies  Allergen Reactions  . Erythromycin Nausea And Vomiting and Other (See Comments)    Diarrhea - more severe     Review of Systems:  Review of Systems  Constitutional: Negative for fever, chills and malaise/fatigue.  HENT: Negative for congestion, ear pain, nosebleeds, sore throat and tinnitus.   Eyes: Negative for blurred vision.  Respiratory: Negative for cough, sputum production, shortness of breath and wheezing.   Cardiovascular: Negative for chest pain, palpitations and leg swelling.  Gastrointestinal: Negative for heartburn, nausea, vomiting, abdominal pain, diarrhea, constipation, blood in stool and melena.  Genitourinary: Negative for dysuria, urgency and frequency.  Musculoskeletal: Negative.   Skin: Negative.   Neurological: Negative for dizziness, tingling, tremors and headaches.    Family history- Review and unchanged Social history- Review and unchanged Physical Exam: BP 124/66 mmHg  Pulse 63  Temp(Src) 97 F (36.1 C) (Temporal)  Resp 14  Ht 5\' 3"  (1.6 m)  Wt 161 lb (73.029 kg)  BMI 28.53 kg/m2  SpO2 99% Wt Readings from Last 3 Encounters:  05/03/15 161 lb (73.029 kg)  02/01/15 157 lb (71.215 kg)  11/02/14 159 lb 12.8 oz (72.485 kg)   General Appearance: Well nourished, in no apparent distress. Eyes: PERRLA, EOMs, conjunctiva no swelling or erythema Sinuses: No Frontal/maxillary tenderness ENT/Mouth: Ext aud canals clear, TMs without erythema, bulging. No erythema, swelling, or exudate on post pharynx.  Tonsils not swollen or erythematous. Hearing normal.  Neck: Supple, thyroid normal.  Respiratory: Respiratory effort normal, BS equal bilaterally without rales, rhonchi, wheezing or stridor.  Cardio: RRR with no MRGs. Brisk peripheral pulses without edema.   Abdomen: Soft, + BS,  Non tender, no guarding, rebound, hernias, masses. Lymphatics: Non tender without lymphadenopathy.  Musculoskeletal: Full ROM, 5/5 strength, Normal gait Skin: Warm, dry without rashes, lesions, ecchymosis.  Neuro: Cranial nerves intact. Normal muscle tone, no cerebellar symptoms. Psych: Awake and oriented X 3, normal affect, Insight and Judgment appropriate.    Vicie Mutters, PA-C 9:49 AM [facility]

## 2015-05-03 NOTE — Patient Instructions (Signed)
Your ears and sinuses are connected by the eustachian tube. When your sinuses are inflamed, this can close off the tube and cause fluid to collect in your middle ear. This can then cause dizziness, popping, clicking, ringing, and echoing in your ears. This is often NOT an infection and does NOT require antibiotics, it is caused by inflammation so the treatments help the inflammation. This can take a long time to get better so please be patient.  Here are things you can do to help with this: - Try the Flonase or Nasonex. Remember to spray each nostril twice towards the outer part of your eye.  Do not sniff but instead pinch your nose and tilt your head back to help the medicine get into your sinuses.  The best time to do this is at bedtime.Stop if you get blurred vision or nose bleeds.  -While drinking fluids, pinch and hold nose close and swallow, to help open eustachian tubes to drain fluid behind ear drums. -Please pick one of the over the counter allergy medications below and take it once daily for allergies.  It will also help with fluid behind ear drums. Claritin or loratadine cheapest but likely the weakest  Zyrtec or certizine at night because it can make you sleepy The strongest is allegra or fexafinadine  Cheapest at [employer], [employer], [employer] -can use decongestant over the counter, please do not use if you have high blood pressure or certain heart conditions.   if worsening HA, changes vision/speech, imbalance, weakness go to the ER   We want weight loss that will last so you should lose 1-2 pounds a week.  THAT IS IT! Please pick THREE things a month to change. Once it is a habit check off the item. Then pick another three items off the list to become habits.  If you are already doing a habit on the list GREAT!  Cross that item off! o Don't drink your calories. Ie, alcohol, soda, fruit juice, and sweet tea.  o Drink more water. Drink a glass when you feel hungry or before each meal.  o Eat  breakfast - Complex carb and protein (likeDannon light and fit yogurt, oatmeal, fruit, eggs, turkey bacon). o Measure your cereal.  Eat no more than one cup a day. (ie Kashi) o Eat an apple a day. o Add a vegetable a day. o Try a new vegetable a month. o Use Pam! Stop using oil or butter to cook. o Don't finish your plate or use smaller plates. o Share your dessert. o Eat sugar free Jello for dessert or frozen grapes. o Don't eat 2-3 hours before bed. o Switch to whole wheat bread, pasta, and brown rice. o Make healthier choices when you eat out. No fries! o Pick baked chicken, NOT fried. o Don't forget to SLOW DOWN when you eat. It is not going anywhere.  o Take the stairs. o Park far away in the parking lot o Lift soup cans (or weights) for 10 minutes while watching TV. o Walk at work for 10 minutes during break. o Walk outside 1 time a week with your friend, kids, dog, or significant other. o Start a walking group at church. o Walk the mall as much as you can tolerate.  o Keep a food diary. o Weigh yourself daily. o Walk for 15 minutes 3 days per week. o Cook at home more often and eat out less.  If life happens and you go back to old habits, it is   Just start over. You can do it!   If you experience chest pain, get short of breath, or tired during the exercise, please stop immediately and inform your doctor.

## 2015-05-04 LAB — LIPID PANEL
CHOLESTEROL: 197 mg/dL (ref 125–200)
HDL: 71 mg/dL (ref >=46)
LDL Cholesterol: 109 mg/dL (ref <130)
Total CHOL/HDL Ratio: 2.8 Ratio (ref <=5.0)
Triglycerides: 84 mg/dL (ref <150)
VLDL: 17 mg/dL (ref <30)

## 2015-05-04 LAB — BASIC METABOLIC PANEL WITH GFR
BUN: 12 mg/dL (ref 7–25)
CHLORIDE: 101 mmol/L (ref 98–110)
CO2: 27 mmol/L (ref 20–31)
Calcium: 8.9 mg/dL (ref 8.6–10.4)
Creat: 0.86 mg/dL (ref 0.50–1.05)
GFR, Est African American: 86 mL/min (ref >=60)
GFR, Est Non African American: 75 mL/min (ref >=60)
GLUCOSE: 60 mg/dL — AB (ref 65–99)
POTASSIUM: 3.8 mmol/L (ref 3.5–5.3)
Sodium: 139 mmol/L (ref 135–146)

## 2015-05-04 LAB — MAGNESIUM: MAGNESIUM: 2.3 mg/dL (ref 1.5–2.5)

## 2015-05-04 LAB — HEPATIC FUNCTION PANEL
ALBUMIN: 3.8 g/dL (ref 3.6–5.1)
ALK PHOS: 62 U/L (ref 33–130)
ALT: 19 U/L (ref 6–29)
AST: 20 U/L (ref 10–35)
Bilirubin, Direct: 0.1 mg/dL (ref <=0.2)
Indirect Bilirubin: 0.4 mg/dL (ref 0.2–1.2)
TOTAL PROTEIN: 6.7 g/dL (ref 6.1–8.1)
Total Bilirubin: 0.5 mg/dL (ref 0.2–1.2)

## 2015-05-04 LAB — INSULIN, FASTING: Insulin fasting, serum: 13.9 u[IU]/mL (ref 2.0–19.6)

## 2015-05-04 LAB — VITAMIN D 25 HYDROXY (VIT D DEFICIENCY, FRACTURES): Vit D, 25-Hydroxy: 55 ng/mL (ref 30–100)

## 2015-05-04 LAB — TSH: TSH: 1.622 u[IU]/mL (ref 0.350–4.500)

## 2015-06-13 ENCOUNTER — Other Ambulatory Visit: Payer: Self-pay | Admitting: Internal Medicine

## 2015-06-16 ENCOUNTER — Other Ambulatory Visit: Payer: Self-pay | Admitting: Physician Assistant

## 2015-07-05 ENCOUNTER — Ambulatory Visit: Payer: 59 | Admitting: Nurse Practitioner

## 2015-08-04 ENCOUNTER — Other Ambulatory Visit: Payer: Self-pay | Admitting: *Deleted

## 2015-08-04 ENCOUNTER — Ambulatory Visit (INDEPENDENT_AMBULATORY_CARE_PROVIDER_SITE_OTHER): Payer: 59 | Admitting: Internal Medicine

## 2015-08-04 ENCOUNTER — Encounter: Payer: Self-pay | Admitting: Internal Medicine

## 2015-08-04 VITALS — BP 140/78 | HR 64 | Temp 97.9°F | Resp 16 | Ht 63.0 in | Wt 159.4 lb

## 2015-08-04 DIAGNOSIS — Z716 Tobacco abuse counseling: Secondary | ICD-10-CM | POA: Diagnosis not present

## 2015-08-04 DIAGNOSIS — Z0001 Encounter for general adult medical examination with abnormal findings: Secondary | ICD-10-CM

## 2015-08-04 DIAGNOSIS — Z79899 Other long term (current) drug therapy: Secondary | ICD-10-CM | POA: Diagnosis not present

## 2015-08-04 DIAGNOSIS — Z0000 Encounter for general adult medical examination without abnormal findings: Secondary | ICD-10-CM

## 2015-08-04 DIAGNOSIS — E782 Mixed hyperlipidemia: Secondary | ICD-10-CM

## 2015-08-04 DIAGNOSIS — F17200 Nicotine dependence, unspecified, uncomplicated: Secondary | ICD-10-CM

## 2015-08-04 DIAGNOSIS — R7309 Other abnormal glucose: Secondary | ICD-10-CM | POA: Diagnosis not present

## 2015-08-04 DIAGNOSIS — I10 Essential (primary) hypertension: Secondary | ICD-10-CM | POA: Diagnosis not present

## 2015-08-04 DIAGNOSIS — M25511 Pain in right shoulder: Secondary | ICD-10-CM

## 2015-08-04 DIAGNOSIS — E039 Hypothyroidism, unspecified: Secondary | ICD-10-CM

## 2015-08-04 DIAGNOSIS — R7303 Prediabetes: Secondary | ICD-10-CM

## 2015-08-04 DIAGNOSIS — E559 Vitamin D deficiency, unspecified: Secondary | ICD-10-CM | POA: Diagnosis not present

## 2015-08-04 DIAGNOSIS — Z111 Encounter for screening for respiratory tuberculosis: Secondary | ICD-10-CM

## 2015-08-04 DIAGNOSIS — R5383 Other fatigue: Secondary | ICD-10-CM | POA: Diagnosis not present

## 2015-08-04 DIAGNOSIS — Z1212 Encounter for screening for malignant neoplasm of rectum: Secondary | ICD-10-CM

## 2015-08-04 LAB — LIPID PANEL
CHOL/HDL RATIO: 2.8 ratio (ref <=5.0)
CHOLESTEROL: 226 mg/dL — AB (ref 125–200)
HDL: 80 mg/dL (ref >=46)
LDL Cholesterol: 127 mg/dL (ref <130)
TRIGLYCERIDES: 95 mg/dL (ref <150)
VLDL: 19 mg/dL (ref <30)

## 2015-08-04 LAB — IRON AND TIBC
%SAT: 27 % (ref 11–50)
IRON: 93 ug/dL (ref 45–160)
TIBC: 344 ug/dL (ref 250–450)
UIBC: 251 ug/dL (ref 125–400)

## 2015-08-04 LAB — CBC WITH DIFFERENTIAL/PLATELET
Basophils Absolute: 0 10*3/uL (ref 0.0–0.1)
Basophils Relative: 0 % (ref 0–1)
Eosinophils Absolute: 0.1 10*3/uL (ref 0.0–0.7)
Eosinophils Relative: 1 % (ref 0–5)
HEMATOCRIT: 42 % (ref 36.0–46.0)
HEMOGLOBIN: 14.1 g/dL (ref 12.0–15.0)
LYMPHS ABS: 1.6 10*3/uL (ref 0.7–4.0)
LYMPHS PCT: 25 % (ref 12–46)
MCH: 32.7 pg (ref 26.0–34.0)
MCHC: 33.6 g/dL (ref 30.0–36.0)
MCV: 97.4 fL (ref 78.0–100.0)
MONO ABS: 0.4 10*3/uL (ref 0.1–1.0)
MONOS PCT: 7 % (ref 3–12)
MPV: 11.4 fL (ref 8.6–12.4)
NEUTROS ABS: 4.2 10*3/uL (ref 1.7–7.7)
Neutrophils Relative %: 67 % (ref 43–77)
Platelets: 304 10*3/uL (ref 150–400)
RBC: 4.31 MIL/uL (ref 3.87–5.11)
RDW: 13.9 % (ref 11.5–15.5)
WBC: 6.2 10*3/uL (ref 4.0–10.5)

## 2015-08-04 LAB — HEPATIC FUNCTION PANEL
ALT: 21 U/L (ref 6–29)
AST: 21 U/L (ref 10–35)
Albumin: 4.4 g/dL (ref 3.6–5.1)
Alkaline Phosphatase: 60 U/L (ref 33–130)
BILIRUBIN DIRECT: 0.1 mg/dL (ref <=0.2)
BILIRUBIN INDIRECT: 0.4 mg/dL (ref 0.2–1.2)
BILIRUBIN TOTAL: 0.5 mg/dL (ref 0.2–1.2)
Total Protein: 7.3 g/dL (ref 6.1–8.1)

## 2015-08-04 LAB — BASIC METABOLIC PANEL WITH GFR
BUN: 11 mg/dL (ref 7–25)
CALCIUM: 9.3 mg/dL (ref 8.6–10.4)
CO2: 31 mmol/L (ref 20–31)
CREATININE: 1.01 mg/dL (ref 0.50–1.05)
Chloride: 97 mmol/L — ABNORMAL LOW (ref 98–110)
GFR, Est African American: 71 mL/min (ref >=60)
GFR, Est Non African American: 62 mL/min (ref >=60)
GLUCOSE: 83 mg/dL (ref 65–99)
Potassium: 3.6 mmol/L (ref 3.5–5.3)
Sodium: 139 mmol/L (ref 135–146)

## 2015-08-04 LAB — MAGNESIUM: MAGNESIUM: 2.2 mg/dL (ref 1.5–2.5)

## 2015-08-04 MED ORDER — EZETIMIBE 10 MG PO TABS: 10.0000 mg | ORAL_TABLET | Freq: Every day | ORAL | Status: DC

## 2015-08-04 MED ORDER — FLUTICASONE PROPIONATE 50 MCG/ACT NA SUSP: 1.0000 | NASAL | Status: DC | PRN

## 2015-08-04 MED ORDER — FLUTICASONE PROPIONATE 50 MCG/ACT NA SUSP: 2.0000 | Freq: Every day | NASAL | Status: DC

## 2015-08-04 MED ORDER — ZETIA 10 MG PO TABS: 10.0000 mg | ORAL_TABLET | Freq: Every day | ORAL | Status: DC

## 2015-08-04 MED FILL — FLUTICASONE PROP 50 MCG SPR: 50 | 30 days supply | Qty: 16 | Fill #0

## 2015-08-04 MED FILL — EZETIMIBE 10 MG TABLET: 10 | 90 days supply | Qty: 90 | Fill #0

## 2015-08-04 NOTE — Patient Instructions (Signed)
Recommend Adult Low Dose Aspirin or   coated  Aspirin 81 mg daily   To reduce risk of Colon Cancer 20 %,   Skin Cancer 26 % ,   Melanoma 46%   and   Pancreatic cancer 60%   ++++++++++++++++++++++++++++++++++++++++++++++++++++++  Vitamin D goal   is between 70-100.   Please make sure that you are taking your Vitamin D as directed.   It is very important as a natural anti-inflammatory   helping hair, skin, and nails, as well as reducing stroke and heart attack risk.   It helps your bones and helps with mood.  It also decreases numerous cancer risks so please take it as directed.   Low Vit D is associated with a 200-300% higher risk for CANCER   and 200-300% higher risk for HEART   ATTACK  &  STROKE.   ......................................  It is also associated with higher death rate at younger ages,   autoimmune diseases like Rheumatoid arthritis, Lupus, Multiple Sclerosis.     Also many other serious conditions, like depression, Alzheimer's  Dementia, infertility, muscle aches, fatigue, fibromyalgia - just to name a few.  ++++++++++++++++++++++++++++++++++++++++++++++++  Recommend the book "The END of DIETING" by Dr Joel Fuhrman   & the book "The END of DIABETES " by Dr Joel Fuhrman  At [employer] - get book & Audio CD's     Being diabetic has a  300% increased risk for heart attack, stroke, cancer, and alzheimer- type vascular dementia. It is very important that you work harder with diet by avoiding all foods that are white. Avoid white rice (brown & wild rice is OK), white potatoes (sweetpotatoes in moderation is OK), White bread or wheat bread or anything made out of white flour like bagels, donuts, rolls, buns, biscuits, cakes, pastries, cookies, pizza crust, and pasta (made from white flour & egg whites) - vegetarian pasta or spinach or wheat pasta is OK. Multigrain breads like Arnold's or [employer], or multigrain sandwich thins or flatbreads.  Diet,  exercise and weight loss can reverse and cure diabetes in the early stages.  Diet, exercise and weight loss is very important in the control and prevention of complications of diabetes which affects every system in your body, ie. Brain - dementia/stroke, eyes - glaucoma/blindness, heart - heart attack/heart failure, kidneys - dialysis, stomach - gastric paralysis, intestines - malabsorption, nerves - severe painful neuritis, circulation - gangrene & loss of a leg(s), and finally cancer and Alzheimers.    I recommend avoid fried & greasy foods,  sweets/candy, white rice (brown or wild rice or Quinoa is OK), white potatoes (sweet potatoes are OK) - anything made from white flour - bagels, doughnuts, rolls, buns, biscuits,white and wheat breads, pizza crust and traditional pasta made of white flour & egg white(vegetarian pasta or spinach or wheat pasta is OK).  Multi-grain bread is OK - like multi-grain flat bread or sandwich thins. Avoid alcohol in excess. Exercise is also important.    Eat all the vegetables you want - avoid meat, especially red meat and dairy - especially cheese.  Cheese is the most concentrated form of trans-fats which is the worst thing to clog up our arteries. Veggie cheese is OK which can be found in the fresh produce section at [employer] or [employer] or [employer]  ++++++++++++++++++++++++++++++++++++++++++++++++++ DASH Eating Plan  DASH stands for "Dietary Approaches to Stop Hypertension."   The DASH eating plan is a healthy eating plan that has been shown to reduce high   blood pressure (hypertension). Additional health benefits may include reducing the risk of type 2 diabetes mellitus, heart disease, and stroke. The DASH eating plan may also help with weight loss.  WHAT DO I NEED TO KNOW ABOUT THE DASH EATING PLAN? For the DASH eating plan, you will follow these general guidelines:  Choose foods with a percent daily value for sodium of less than 5% (as listed on the food  label).  Use salt-free seasonings or herbs instead of table salt or sea salt.  Check with your health care provider or pharmacist before using salt substitutes.  Eat lower-sodium products, often labeled as "lower sodium" or "no salt added."  Eat fresh foods.  Eat more vegetables, fruits, and low-fat dairy products.    Choose whole grains. Look for the word "whole" as the first word in the ingredient list.  Choose fish   Limit sweets, desserts, sugars, and sugary drinks.  Choose heart-healthy fats.  Eat veggie cheese   Eat more home-cooked food and less restaurant, buffet, and fast food.  Limit fried foods.  Cook foods using methods other than frying.  Limit canned vegetables. If you do use them, rinse them well to decrease the sodium.  When eating at a restaurant, ask that your food be prepared with less salt, or no salt if possible.                      WHAT FOODS CAN I EAT?  Seek help from a dietitian for individual calorie needs.  Grains Whole grain or whole wheat bread. Brown rice. Whole grain or whole wheat pasta. Quinoa, bulgur, and whole grain cereals. Low-sodium cereals. Corn or whole wheat flour tortillas. Whole grain cornbread. Whole grain crackers. Low-sodium crackers.  Vegetables Fresh or frozen vegetables (raw, steamed, roasted, or grilled). Low-sodium or reduced-sodium tomato and vegetable juices. Low-sodium or reduced-sodium tomato sauce and paste. Low-sodium or reduced-sodium canned vegetables.   Fruits All fresh, canned (in natural juice), or frozen fruits.  Protein Products  All fish and seafood.  Dried beans, peas, or lentils. Unsalted nuts and seeds. Unsalted canned beans.  Dairy Low-fat dairy products, such as skim or 1% milk, 2% or reduced-fat cheeses, low-fat ricotta or cottage cheese, or plain low-fat yogurt. Low-sodium or reduced-sodium cheeses.  Fats and Oils Tub margarines without trans fats. Light or reduced-fat mayonnaise and salad  dressings (reduced sodium). Avocado. Safflower, olive, or canola oils. Natural peanut or almond butter.  Other Unsalted popcorn and pretzels. The items listed above may not be a complete list of recommended foods or beverages. Contact your dietitian for more options.  +++++++++++++++++++++++++++++++++++++++++++  WHAT FOODS ARE NOT RECOMMENDED?  Grains/ White flour or wheat flour  White bread. White pasta. White rice. Refined cornbread. Bagels and croissants. Crackers that contain trans fat.  Vegetables  Creamed or fried vegetables. Vegetables in a . Regular canned vegetables. Regular canned tomato sauce and paste. Regular tomato and vegetable juices.  Fruits Dried fruits. Canned fruit in light or heavy syrup. Fruit juice.  Meat and Other Protein Products Meat in general. Fatty cuts of meat. Ribs, chicken wings, bacon, sausage, bologna, salami, chitterlings, fatback, hot dogs, bratwurst, and packaged luncheon meats.  Dairy Whole or 2% milk, cream, half-and-half, and cream cheese. Whole-fat or sweetened yogurt. Full-fat cheeses or blue cheese. Nondairy creamers and whipped toppings. Processed cheese, cheese spreads, or cheese curds.  Condiments Onion and garlic salt, seasoned salt, table salt, and sea salt. Canned and packaged gravies. Worcestershire sauce. Tartar sauce.  Barbecue sauce. Teriyaki sauce. Soy sauce, including reduced sodium. Steak sauce. Fish sauce. Oyster sauce. Cocktail sauce. Horseradish. Ketchup and mustard. Meat flavorings and tenderizers. Bouillon cubes. Hot sauce. Tabasco sauce. Marinades. Taco seasonings. Relishes.  Fats and Oils Butter, stick margarine, lard, shortening, ghee, and bacon fat. Coconut, palm kernel, or palm oils. Regular salad dressings.  Pickles and olives. Salted popcorn and pretzels. The items listed above may not be a complete list of foods and beverages to avoid.   Preventive Care for Adults  A healthy lifestyle and preventive care can  promote health and wellness. Preventive health guidelines for women include the following key practices.  A routine yearly physical is a good way to check with your health care provider about your health and preventive screening. It is a chance to share any concerns and updates on your health and to receive a thorough exam.  Visit your dentist for a routine exam and preventive care every 6 months. Brush your teeth twice a day and floss once a day. Good oral hygiene prevents tooth decay and gum disease.  The frequency of eye exams is based on your age, health, family medical history, use of contact lenses, and other factors. Follow your health care provider's recommendations for frequency of eye exams.  Eat a healthy diet. Foods like vegetables, fruits, whole grains, low-fat dairy products, and lean protein foods contain the nutrients you need without too many calories. Decrease your intake of foods high in solid fats, added sugars, and salt. Eat the right amount of calories for you.Get information about a proper diet from your health care provider, if necessary.  Regular physical exercise is one of the most important things you can do for your health. Most adults should get at least 150 minutes of moderate-intensity exercise (any activity that increases your heart rate and causes you to sweat) each week. In addition, most adults need muscle-strengthening exercises on 2 or more days a week.  Maintain a healthy weight. The body mass index (BMI) is a screening tool to identify possible weight problems. It provides an estimate of body fat based on height and weight. Your health care provider can find your BMI and can help you achieve or maintain a healthy weight.For adults 20 years and older:  A BMI below 18.5 is considered underweight.  A BMI of 18.5 to 24.9 is normal.  A BMI of 25 to 29.9 is considered overweight.  A BMI of 30 and above is considered obese.  Maintain normal blood lipids and  cholesterol levels by exercising and minimizing your intake of saturated fat. Eat a balanced diet with plenty of fruit and vegetables. Blood tests for lipids and cholesterol should begin at age 31 and be repeated every 5 years. If your lipid or cholesterol levels are high, you are over 50, or you are at high risk for heart disease, you may need your cholesterol levels checked more frequently.Ongoing high lipid and cholesterol levels should be treated with medicines if diet and exercise are not working.  If you smoke, find out from your health care provider how to quit. If you do not use tobacco, do not start.  Lung cancer screening is recommended for adults aged 83-80 years who are at high risk for developing lung cancer because of a history of smoking. A yearly low-dose CT scan of the lungs is recommended for people who have at least a 30-pack-year history of smoking and are a current smoker or have quit within the past 15 years.  A pack year of smoking is smoking an average of 1 pack of cigarettes a day for 1 year (for example: 1 pack a day for 30 years or 2 packs a day for 15 years). Yearly screening should continue until the smoker has stopped smoking for at least 15 years. Yearly screening should be stopped for people who develop a health problem that would prevent them from having lung cancer treatment.  High blood pressure causes heart disease and increases the risk of stroke. Your blood pressure should be checked at least every 1 to 2 years. Ongoing high blood pressure should be treated with medicines if weight loss and exercise do not work.  If you are 61-30 years old, ask your health care provider if you should take aspirin to prevent strokes.  Diabetes screening involves taking a blood sample to check your fasting blood sugar level. This should be done once every 3 years, after age 9, if you are within normal weight and without risk factors for diabetes. Testing should be considered at a  younger age or be carried out more frequently if you are overweight and have at least 1 risk factor for diabetes.  Breast cancer screening is essential preventive care for women. You should practice "breast self-awareness." This means understanding the normal appearance and feel of your breasts and may include breast self-examination. Any changes detected, no matter how small, should be reported to a health care provider. Women in their 74s and 30s should have a clinical breast exam (CBE) by a health care provider as part of a regular health exam every 1 to 3 years. After age 75, women should have a CBE every year. Starting at age 19, women should consider having a mammogram (breast X-ray test) every year. Women who have a family history of breast cancer should talk to their health care provider about genetic screening. Women at a high risk of breast cancer should talk to their health care providers about having an MRI and a mammogram every year.  Breast cancer gene (BRCA)-related cancer risk assessment is recommended for women who have family members with BRCA-related cancers. BRCA-related cancers include breast, ovarian, tubal, and peritoneal cancers. Having family members with these cancers may be associated with an increased risk for harmful changes (mutations) in the breast cancer genes BRCA1 and BRCA2. Results of the assessment will determine the need for genetic counseling and BRCA1 and BRCA2 testing.  Routine pelvic exams to screen for cancer are no longer recommended for nonpregnant women who are considered low risk for cancer of the pelvic organs (ovaries, uterus, and vagina) and who do not have symptoms. Ask your health care provider if a screening pelvic exam is right for you.  If you have had past treatment for cervical cancer or a condition that could lead to cancer, you need Pap tests and screening for cancer for at least 20 years after your treatment. If Pap tests have been discontinued, your  risk factors (such as having a new sexual partner) need to be reassessed to determine if screening should be resumed. Some women have medical problems that increase the chance of getting cervical cancer. In these cases, your health care provider may recommend more frequent screening and Pap tests.  Colorectal cancer can be detected and often prevented. Most routine colorectal cancer screening begins at the age of 23 years and continues through age 42 years. However, your health care provider may recommend screening at an earlier age if you have risk factors for colon  cancer. On a yearly basis, your health care provider may provide home test kits to check for hidden blood in the stool. Use of a small camera at the end of a tube, to directly examine the colon (sigmoidoscopy or colonoscopy), can detect the earliest forms of colorectal cancer. Talk to your health care provider about this at age 50, when routine screening begins. Direct exam of the colon should be repeated every 5-10 years through age 75 years, unless early forms of pre-cancerous polyps or small growths are found.  Hepatitis C blood testing is recommended for all people born from 1945 through 1965 and any individual with known risks for hepatitis C.  Pra  Osteoporosis is a disease in which the bones lose minerals and strength with aging. This can result in serious bone fractures or breaks. The risk of osteoporosis can be identified using a bone density scan. Women ages 65 years and over and women at risk for fractures or osteoporosis should discuss screening with their health care providers. Ask your health care provider whether you should take a calcium supplement or vitamin D to reduce the rate of osteoporosis.  Menopause can be associated with physical symptoms and risks. Hormone replacement therapy is available to decrease symptoms and risks. You should talk to your health care provider about whether hormone replacement therapy is right  for you.  Use sunscreen. Apply sunscreen liberally and repeatedly throughout the day. You should seek shade when your shadow is shorter than you. Protect yourself by wearing long sleeves, pants, a wide-brimmed hat, and sunglasses year round, whenever you are outdoors.  Once a month, do a whole body skin exam, using a mirror to look at the skin on your back. Tell your health care provider of new moles, moles that have irregular borders, moles that are larger than a pencil eraser, or moles that have changed in shape or color.  Stay current with required vaccines (immunizations).  Influenza vaccine. All adults should be immunized every year.  Tetanus, diphtheria, and acellular pertussis (Td, Tdap) vaccine. Pregnant women should receive 1 dose of Tdap vaccine during each pregnancy. The dose should be obtained regardless of the length of time since the last dose. Immunization is preferred during the 27th-36th week of gestation. An adult who has not previously received Tdap or who does not know her vaccine status should receive 1 dose of Tdap. This initial dose should be followed by tetanus and diphtheria toxoids (Td) booster doses every 10 years. Adults with an unknown or incomplete history of completing a 3-dose immunization series with Td-containing vaccines should begin or complete a primary immunization series including a Tdap dose. Adults should receive a Td booster every 10 years.  Varicella vaccine. An adult without evidence of immunity to varicella should receive 2 doses or a second dose if she has previously received 1 dose. Pregnant females who do not have evidence of immunity should receive the first dose after pregnancy. This first dose should be obtained before leaving the health care facility. The second dose should be obtained 4-8 weeks after the first dose.  Human papillomavirus (HPV) vaccine. Females aged 13-26 years who have not received the vaccine previously should obtain the 3-dose  series. The vaccine is not recommended for use in pregnant females. However, pregnancy testing is not needed before receiving a dose. If a female is found to be pregnant after receiving a dose, no treatment is needed. In that case, the remaining doses should be delayed until after the pregnancy. Immunization   is recommended for any person with an immunocompromised condition through the age of 26 years if she did not get any or all doses earlier. During the 3-dose series, the second dose should be obtained 4-8 weeks after the first dose. The third dose should be obtained 24 weeks after the first dose and 16 weeks after the second dose.  Zoster vaccine. One dose is recommended for adults aged 60 years or older unless certain conditions are present.  Measles, mumps, and rubella (MMR) vaccine. Adults born before 1957 generally are considered immune to measles and mumps. Adults born in 1957 or later should have 1 or more doses of MMR vaccine unless there is a contraindication to the vaccine or there is laboratory evidence of immunity to each of the three diseases. A routine second dose of MMR vaccine should be obtained at least 28 days after the first dose for students attending postsecondary schools, health care workers, or international travelers. People who received inactivated measles vaccine or an unknown type of measles vaccine during 1963-1967 should receive 2 doses of MMR vaccine. People who received inactivated mumps vaccine or an unknown type of mumps vaccine before 1979 and are at high risk for mumps infection should consider immunization with 2 doses of MMR vaccine. For females of childbearing age, rubella immunity should be determined. If there is no evidence of immunity, females who are not pregnant should be vaccinated. If there is no evidence of immunity, females who are pregnant should delay immunization until after pregnancy. Unvaccinated health care workers born before 1957 who lack laboratory  evidence of measles, mumps, or rubella immunity or laboratory confirmation of disease should consider measles and mumps immunization with 2 doses of MMR vaccine or rubella immunization with 1 dose of MMR vaccine.  Pneumococcal 13-valent conjugate (PCV13) vaccine. When indicated, a person who is uncertain of her immunization history and has no record of immunization should receive the PCV13 vaccine. An adult aged 19 years or older who has certain medical conditions and has not been previously immunized should receive 1 dose of PCV13 vaccine. This PCV13 should be followed with a dose of pneumococcal polysaccharide (PPSV23) vaccine. The PPSV23 vaccine dose should be obtained at least 8 weeks after the dose of PCV13 vaccine. An adult aged 19 years or older who has certain medical conditions and previously received 1 or more doses of PPSV23 vaccine should receive 1 dose of PCV13. The PCV13 vaccine dose should be obtained 1 or more years after the last PPSV23 vaccine dose.    Pneumococcal polysaccharide (PPSV23) vaccine. When PCV13 is also indicated, PCV13 should be obtained first. All adults aged 65 years and older should be immunized. An adult younger than age 65 years who has certain medical conditions should be immunized. Any person who resides in a nursing home or long-term care facility should be immunized. An adult smoker should be immunized. People with an immunocompromised condition and certain other conditions should receive both PCV13 and PPSV23 vaccines. People with human immunodeficiency virus (HIV) infection should be immunized as soon as possible after diagnosis. Immunization during chemotherapy or radiation therapy should be avoided. Routine use of PPSV23 vaccine is not recommended for American Indians, [place] Natives, or people younger than 65 years unless there are medical conditions that require PPSV23 vaccine. When indicated, people who have unknown immunization and have no record of immunization  should receive PPSV23 vaccine. One-time revaccination 5 years after the first dose of PPSV23 is recommended for people aged 19-64 years who have   chronic kidney failure, nephrotic syndrome, asplenia, or immunocompromised conditions. People who received 1-2 doses of PPSV23 before age 28 years should receive another dose of PPSV23 vaccine at age 64 years or later if at least 5 years have passed since the previous dose. Doses of PPSV23 are not needed for people immunized with PPSV23 at or after age 65 years.  Preventive Services / Frequency   Ages 52 to 76 years  Blood pressure check.  Lipid and cholesterol check.  Lung cancer screening. / Every year if you are aged 62-80 years and have a 30-pack-year history of smoking and currently smoke or have quit within the past 15 years. Yearly screening is stopped once you have quit smoking for at least 15 years or develop a health problem that would prevent you from having lung cancer treatment.  Clinical breast exam.** / Every year after age 38 years.  BRCA-related cancer risk assessment.** / For women who have family members with a BRCA-related cancer (breast, ovarian, tubal, or peritoneal cancers).  Mammogram.** / Every year beginning at age 69 years and continuing for as long as you are in good health. Consult with your health care provider.  Pap test.** / Every 3 years starting at age 29 years through age 47 or 54 years with a history of 3 consecutive normal Pap tests.  HPV screening.** / Every 3 years from ages 1 years through ages 108 to 42 years with a history of 3 consecutive normal Pap tests.  Fecal occult blood test (FOBT) of stool. / Every year beginning at age 36 years and continuing until age 71 years. You may not need to do this test if you get a colonoscopy every 10 years.  Flexible sigmoidoscopy or colonoscopy.** / Every 5 years for a flexible sigmoidoscopy or every 10 years for a colonoscopy beginning at age 65 years and continuing  until age 21 years.  Hepatitis C blood test.** / For all people born from [birth year] through 1965 and any individual with known risks for hepatitis C.  Skin self-exam. / Monthly.  Influenza vaccine. / Every year.  Tetanus, diphtheria, and acellular pertussis (Tdap/Td) vaccine.** / Consult your health care provider. Pregnant women should receive 1 dose of Tdap vaccine during each pregnancy. 1 dose of Td every 10 years.  Varicella vaccine.** / Consult your health care provider. Pregnant females who do not have evidence of immunity should receive the first dose after pregnancy.  Zoster vaccine.** / 1 dose for adults aged 69 years or older.  Pneumococcal 13-valent conjugate (PCV13) vaccine.** / Consult your health care provider.  Pneumococcal polysaccharide (PPSV23) vaccine.** / 1 to 2 doses if you smoke cigarettes or if you have certain conditions.  Meningococcal vaccine.** / Consult your health care provider.  Hepatitis A vaccine.** / Consult your health care provider.  Hepatitis B vaccine.** / Consult your health care provider. Screening for abdominal aortic aneurysm (AAA)  by ultrasound is recommended for people over 50 who have history of high blood pressure or who are current or former smokers.

## 2015-08-04 NOTE — Progress Notes (Signed)
Patient ID: Tammy Duran, female   DOB: 09-20-1955, 60 y.o.   MRN: [id number]  Annual Screening/Preventative Visit And Comprehensive Evaluation &  Examination  This very nice 60 y.o.female presents for presents for a Wellness/Preventative Visit & comprehensive evaluation and management of multiple medical co-morbidities.  Patient has been followed for HTN, Prediabetes, Hyperlipidemia, P/S Hypothyroidism and Vitamin D Deficiency.    HTN predates since 11. Patient's BP has been controlled at home and patient denies any cardiac symptoms as chest pain, palpitations, shortness of breath, dizziness or ankle swelling. Today's BP: 140/78 mmHg    Patient's hyperlipidemia is not controlled with diet.Last lipids were not at goal with Cholesterol 197; HDL 71; LDL 109; Triglycerides 84 on 05/03/2015.   Patient has prediabetes predating since since 2011 with A1c 5.8%, 5.9% in 2012 and 5.7% in 2013 and patient denies reactive hypoglycemic symptoms, visual blurring, diabetic polys, or paresthesias. 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 Patient has been on suppressive thyroid replacement since that time. Finally, patient has history of Vitamin D Deficiency of "33" in 2008 and last Vitamin D was  55 on 05/03/2015.   Medication Sig  . aspirin 81 MG Take 81 mg by mouth daily.    Marland Kitchen atenolol  100 MG  1/2-1 pill for blood pressure daily  . Biotin 5000 MCG  Take by mouth daily.  . WELLBUTRIN XL 300 MG TAKE 1 TABLET BY MOUTH ONCE DAILY  . cetirizine (ZYRTEC) 10 MG Take 10 mg by mouth as needed.   Marland Kitchen VITAMIN D-3 Take 1,000 Units by mouth daily.  Marland Kitchen CINNAMON PO Take 1,000 mg by mouth 2 (two) times daily.   Marland Kitchen levothyroxine 112 MCG  TAKE 1 TABLET BY M., W, F; 1/2 TABLET Tue, Thurs, Sat., Sun.)  . lisinopril-hctz) 20-25 MG  TAKE 1 TABLET BY MOUTH DAILY.  Marland Kitchen MINIVELLE 0.025 MG/24HR    . HAIR/SKIN/NAILS  Take by mouth daily. 3 tablets daily  . Omega-3 FISH OIL 1000 MG Take by mouth daily.  . vitamin E 400 UNIT  Take 400 Units by mouth daily.   Allergies  Allergen Reactions  . Erythromycin Nausea And Vomiting and Other (See Comments)    Diarrhea - more severe   Past Medical History  Diagnosis Date  . Hypertension   . Hyperlipidemia   . Thyroid disease   . Papilledema     bilateral  . Migraine headache   . Anxiety disorder   . History of thyroid cancer   . GERD (gastroesophageal reflux disease)   . Pseudotumor cerebri 04/19/2013  . Vitamin D deficiency   . Cancer [facility])     thyroid   Health Maintenance  Topic Date Due  . INFLUENZA VACCINE  02/13/2016  . MAMMOGRAM  05/25/2017  . COLONOSCOPY  08/20/2017  . PAP SMEAR  04/18/2018  . TETANUS/TDAP  05/02/2025  . Hepatitis C Screening  Completed  . HIV Screening  Completed   Immunization History  Administered Date(s) Administered  . DTaP 12/14/1994  . Hepatitis B 07/15/1989  . PPD Test 07/18/2014  . Pneumococcal Polysaccharide-23 10/09/1998  . Tdap 05/03/2015   Past Surgical History  Procedure Laterality Date  . Abdominal hysterectomy  2005  . Thyroidectomy  04/25/11   Family History  Problem Relation Age of Onset  . Diabetes Mother   . Heart disease Mother   .  Hypertension Mother   . Hyperlipidemia Mother   . COPD Mother   . Depression Father   . Suicidality Father   . Stroke Brother   . Heart disease Brother   . Cancer Maternal Aunt     lung  . Asthma Son    Social History  Substance Use Topics  . Smoking status: Current Every Day Smoker -- 0.20 packs/day    Types: Cigarettes  . Smokeless tobacco: Never Used     Comment: 1-2 cigarettes per day  . Alcohol Use: Yes     Comment: weekends    ROS Constitutional: Denies fever, chills, weight loss/gain, headaches, insomnia,  night sweats, and change in appetite. Does c/o fatigue. Eyes: Denies redness, blurred vision, diplopia, discharge,  itchy, watery eyes.  ENT: Denies discharge, congestion, post nasal drip, epistaxis, sore throat, earache, hearing loss, dental pain, Tinnitus, Vertigo, Sinus pain, snoring.  Cardio: Denies chest pain, palpitations, irregular heartbeat, syncope, dyspnea, diaphoresis, orthopnea, PND, claudication, edema Respiratory: denies cough, dyspnea, DOE, pleurisy, hoarseness, laryngitis, wheezing.  Gastrointestinal: Denies dysphagia, heartburn, reflux, water brash, pain, cramps, nausea, vomiting, bloating, diarrhea, constipation, hematemesis, melena, hematochezia, jaundice, hemorrhoids Genitourinary: Denies dysuria, frequency, urgency, nocturia, hesitancy, discharge, hematuria, flank pain Breast: Breast lumps, nipple discharge, bleeding.  Musculoskeletal: Denies arthralgia, myalgia, stiffness, Jt. Swelling, pain, limp, and strain/sprain. Denies falls. Skin: Denies puritis, rash, hives, warts, acne, eczema, changing in skin lesion Neuro: No weakness, tremor, incoordination, spasms, paresthesia, pain Psychiatric: Denies confusion, memory loss, sensory loss. Denies Depression. Endocrine: Denies change in weight, skin, hair change, nocturia, and paresthesia, diabetic polys, visual blurring, hyper / hypo glycemic episodes.  Heme/Lymph: No excessive bleeding, bruising, enlarged lymph nodes.  Physical Exam  BP 140/78 mmHg  Pulse 64  Temp(Src) 97.9 F (36.6 C)  Resp 16  Ht 5\' 3"  (1.6 m)  Wt 159 lb 6.4 oz (72.303 kg)  BMI 28.24 kg/m2  General Appearance: Well nourished and in no apparent distress. Eyes: PERRLA, EOMs, conjunctiva no swelling or erythema, normal fundi and vessels. Sinuses: No frontal/maxillary tenderness ENT/Mouth: EACs patent / TMs  nl. Nares clear without erythema, swelling, mucoid exudates. Oral hygiene is good. No erythema, swelling, or exudate. Tongue normal, non-obstructing. Tonsils not swollen or erythematous. Hearing normal.  Neck: Supple, thyroid normal. No bruits, nodes or  JVD. Respiratory: Respiratory effort normal.  BS equal and clear bilateral without rales, rhonci, wheezing or stridor. Cardio: Heart sounds are normal with regular rate and rhythm and no murmurs, rubs or gallops. Peripheral pulses are normal and equal bilaterally without edema. No aortic or femoral bruits. Chest: symmetric with normal excursions and percussion. Breasts: Symmetric, without lumps, nipple discharge, retractions, or fibrocystic changes.  Abdomen: Flat, soft, with bowl sounds. Nontender, no guarding, rebound, hernias, masses, or organomegaly.  Lymphatics: Non tender without lymphadenopathy.  Genitourinary:  Musculoskeletal: Full ROM all peripheral extremities, joint stability, 5/5 strength, and normal gait. Skin: Warm and dry without rashes, lesions, cyanosis, clubbing or  ecchymosis.  Neuro: Cranial nerves intact, reflexes equal bilaterally. Normal muscle tone, no cerebellar symptoms. Sensation intact.  Pysch: Alert and oriented X 3, normal affect, Insight and Judgment appropriate.   Assessment and Plan  1. Annual Preventative Screening Examination  2. Essential hypertension  - Microalbumin / creatinine urine ratio - EKG 12-Lead - [place], RETROPERITNL ABD,  LTD - TSH  3. Hyperlipidemia  - Lipid panel - TSH  4. Prediabetes  - Hemoglobin A1c - Insulin, random  5. Vitamin D deficiency  - VITAMIN D 25 Hydroxy   6.  Hypothyroidism   7. Screening for rectal cancer  - POC Hemoccult Bld/Stl   8. Other fatigue  - Vitamin B12 - Iron and TIBC - CBC with Differential/Platelet - TSH  9. Medication management  - Urinalysis, Routine w reflex microscopic  - CBC with Differential/Platelet - BASIC METABOLIC PANEL WITH GFR - Hepatic function panel - Magnesium   Continue prudent diet as discussed, weight control, BP monitoring, regular exercise, and medications. Discussed med's effects and SE's. Screening labs and tests as requested with regular follow-up as  recommended. Over 40 minutes of exam, counseling, chart review and high complex critical decision making was performed.

## 2015-08-05 LAB — VITAMIN B12: VITAMIN B 12: 653 pg/mL (ref 211–911)

## 2015-08-05 LAB — URINALYSIS, ROUTINE W REFLEX MICROSCOPIC
Bilirubin Urine: NEGATIVE
Glucose, UA: NEGATIVE
HGB URINE DIPSTICK: NEGATIVE
KETONES UR: NEGATIVE
Leukocytes, UA: NEGATIVE
NITRITE: NEGATIVE
Protein, ur: NEGATIVE
SPECIFIC GRAVITY, URINE: 1.013 (ref 1.001–1.035)
pH: 7.5 (ref 5.0–8.0)

## 2015-08-05 LAB — MICROALBUMIN / CREATININE URINE RATIO
CREATININE, URINE: 56 mg/dL (ref 20–320)
Microalb, Ur: <0.2 mg/dL

## 2015-08-05 LAB — VITAMIN D 25 HYDROXY (VIT D DEFICIENCY, FRACTURES): VIT D 25 HYDROXY: 56 ng/mL (ref 30–100)

## 2015-08-05 LAB — HEMOGLOBIN A1C
HEMOGLOBIN A1C: 5.7 % — AB (ref <5.7)
Mean Plasma Glucose: 117 mg/dL — ABNORMAL HIGH (ref <117)

## 2015-08-05 LAB — TSH: TSH: 2.553 u[IU]/mL (ref 0.350–4.500)

## 2015-08-07 LAB — INSULIN, RANDOM: Insulin: 38.1 u[IU]/mL — ABNORMAL HIGH (ref 2.0–19.6)

## 2015-08-15 DIAGNOSIS — M7541 Impingement syndrome of right shoulder: Secondary | ICD-10-CM | POA: Diagnosis not present

## 2015-08-15 MED FILL — NAPROXEN 500 MG TABLET: 500 | 30 days supply | Qty: 60 | Fill #0

## 2015-08-28 ENCOUNTER — Other Ambulatory Visit: Payer: Self-pay | Admitting: *Deleted

## 2015-08-28 DIAGNOSIS — Z1212 Encounter for screening for malignant neoplasm of rectum: Secondary | ICD-10-CM

## 2015-08-28 LAB — POC HEMOCCULT BLD/STL (HOME/3-CARD/SCREEN)
Card #2 Fecal Occult Blod, POC: NEGATIVE
FECAL OCCULT BLD: NEGATIVE
Fecal Occult Blood, POC: NEGATIVE

## 2015-09-11 MED FILL — BUPROPION HCL XL 300 MG TAB: 300 | 90 days supply | Qty: 90 | Fill #1

## 2015-09-11 MED FILL — LISINOPRIL-HCTZ 20-25 MG TA: 20-25 | 90 days supply | Qty: 90 | Fill #1

## 2015-09-13 DIAGNOSIS — M7541 Impingement syndrome of right shoulder: Secondary | ICD-10-CM | POA: Diagnosis not present

## 2015-10-20 DIAGNOSIS — H52223 Regular astigmatism, bilateral: Secondary | ICD-10-CM | POA: Diagnosis not present

## 2015-10-20 DIAGNOSIS — H524 Presbyopia: Secondary | ICD-10-CM | POA: Diagnosis not present

## 2015-11-02 MED FILL — ESTRADIOL 0.025 MG PATCH: 0.025 | 84 days supply | Qty: 24 | Fill #1

## 2015-11-04 ENCOUNTER — Encounter: Payer: Self-pay | Admitting: *Deleted

## 2015-11-07 ENCOUNTER — Ambulatory Visit (INDEPENDENT_AMBULATORY_CARE_PROVIDER_SITE_OTHER): Payer: 59 | Admitting: Internal Medicine

## 2015-11-07 ENCOUNTER — Encounter: Payer: Self-pay | Admitting: Internal Medicine

## 2015-11-07 VITALS — BP 128/76 | HR 74 | Temp 98.2°F | Resp 18 | Ht 63.0 in | Wt 158.0 lb

## 2015-11-07 DIAGNOSIS — E039 Hypothyroidism, unspecified: Secondary | ICD-10-CM

## 2015-11-07 DIAGNOSIS — E559 Vitamin D deficiency, unspecified: Secondary | ICD-10-CM

## 2015-11-07 DIAGNOSIS — R7303 Prediabetes: Secondary | ICD-10-CM

## 2015-11-07 DIAGNOSIS — R7309 Other abnormal glucose: Secondary | ICD-10-CM | POA: Diagnosis not present

## 2015-11-07 DIAGNOSIS — Z79899 Other long term (current) drug therapy: Secondary | ICD-10-CM | POA: Diagnosis not present

## 2015-11-07 DIAGNOSIS — E782 Mixed hyperlipidemia: Secondary | ICD-10-CM

## 2015-11-07 LAB — HEPATIC FUNCTION PANEL
ALBUMIN: 4.3 g/dL (ref 3.6–5.1)
ALK PHOS: 60 U/L (ref 33–130)
ALT: 21 U/L (ref 6–29)
AST: 18 U/L (ref 10–35)
BILIRUBIN TOTAL: 0.5 mg/dL (ref 0.2–1.2)
Bilirubin, Direct: 0.1 mg/dL (ref <=0.2)
Indirect Bilirubin: 0.4 mg/dL (ref 0.2–1.2)
TOTAL PROTEIN: 7.1 g/dL (ref 6.1–8.1)

## 2015-11-07 LAB — CBC WITH DIFFERENTIAL/PLATELET
BASOS PCT: 0 %
Basophils Absolute: 0 cells/uL (ref 0–200)
EOS ABS: 54 {cells}/uL (ref 15–500)
Eosinophils Relative: 1 %
HEMATOCRIT: 42.2 % (ref 35.0–45.0)
HEMOGLOBIN: 13.8 g/dL (ref 11.7–15.5)
Lymphocytes Relative: 29 %
Lymphs Abs: 1566 cells/uL (ref 850–3900)
MCH: 31.4 pg (ref 27.0–33.0)
MCHC: 32.7 g/dL (ref 32.0–36.0)
MCV: 95.9 fL (ref 80.0–100.0)
MONO ABS: 270 {cells}/uL (ref 200–950)
MPV: 11.4 fL (ref 7.5–12.5)
Monocytes Relative: 5 %
NEUTROS ABS: 3510 {cells}/uL (ref 1500–7800)
Neutrophils Relative %: 65 %
Platelets: 294 10*3/uL (ref 140–400)
RBC: 4.4 MIL/uL (ref 3.80–5.10)
RDW: 13.3 % (ref 11.0–15.0)
WBC: 5.4 10*3/uL (ref 3.8–10.8)

## 2015-11-07 LAB — TSH: TSH: 2.04 mIU/L

## 2015-11-07 LAB — LIPID PANEL
CHOL/HDL RATIO: 2.6 ratio (ref <=5.0)
Cholesterol: 205 mg/dL — ABNORMAL HIGH (ref 125–200)
HDL: 80 mg/dL (ref >=46)
LDL Cholesterol: 108 mg/dL (ref <130)
Triglycerides: 87 mg/dL (ref <150)
VLDL: 17 mg/dL (ref <30)

## 2015-11-07 LAB — HEMOGLOBIN A1C
HEMOGLOBIN A1C: 5.1 % (ref <5.7)
MEAN PLASMA GLUCOSE: 100 mg/dL

## 2015-11-07 LAB — BASIC METABOLIC PANEL WITH GFR
BUN: 13 mg/dL (ref 7–25)
CHLORIDE: 100 mmol/L (ref 98–110)
CO2: 29 mmol/L (ref 20–31)
Calcium: 9.4 mg/dL (ref 8.6–10.4)
Creat: 1.03 mg/dL (ref 0.50–1.05)
GFR, Est African American: 69 mL/min (ref >=60)
GFR, Est Non African American: 60 mL/min (ref >=60)
GLUCOSE: 82 mg/dL (ref 65–99)
POTASSIUM: 4.1 mmol/L (ref 3.5–5.3)
Sodium: 136 mmol/L (ref 135–146)

## 2015-11-07 MED ORDER — MONTELUKAST SODIUM 10 MG PO TABS: 10.0000 mg | ORAL_TABLET | Freq: Every day | ORAL | Status: DC

## 2015-11-07 MED FILL — MONTELUKAST SOD 10 MG TAB: 10 | 30 days supply | Qty: 30 | Fill #0

## 2015-11-07 NOTE — Progress Notes (Signed)
Assessment and Plan:  Hypertension:  -Continue medication,  -monitor blood pressure at home.  -Continue DASH diet.   -Reminder to go to the ER if any CP, SOB, nausea, dizziness, severe HA, changes vision/speech, left arm numbness and tingling, and jaw pain.  Cholesterol: -Continue diet and exercise.  -Check cholesterol.   Pre-diabetes: -Continue diet and exercise.  -Check A1C  Vitamin D Def: -check level -continue medications.   Allergic rhinitis -singulair -cont flonase -saline -zyrtec daily  Continue diet and meds as discussed. Further disposition pending results of labs.  HPI 60 y.o. female  presents for 3 month follow up with hypertension, hyperlipidemia, prediabetes and vitamin D.   Her blood pressure has been controlled at home, today their BP is BP: 128/76 mmHg.   She does workout. She denies chest pain, shortness of breath, dizziness.   She is on cholesterol medication and denies myalgias. Her cholesterol is not at goal. The cholesterol last visit was:   Lab Results  Component Value Date   CHOL 226* 08/04/2015   HDL 80 08/04/2015   LDLCALC 127 08/04/2015   TRIG 95 08/04/2015   CHOLHDL 2.8 08/04/2015     She has been working on diet and exercise for prediabetes, and denies foot ulcerations, hyperglycemia, hypoglycemia , increased appetite, nausea, paresthesia of the feet, polydipsia, polyuria, visual disturbances, vomiting and weight loss. Last A1C in the office was:  Lab Results  Component Value Date   HGBA1C 5.7* 08/04/2015    Patient is on Vitamin D supplement.  Lab Results  Component Value Date   VD25OH 50 08/04/2015     She reports that she has been having some sinus congestion and post nasal drainage.  She does not want to use astelin.  She has never used singulair.     Current Medications:  Current Outpatient Prescriptions on File Prior to Visit  Medication Sig Dispense Refill  . aspirin 81 MG tablet Take 81 mg by mouth daily.      Marland Kitchen atenolol  (TENORMIN) 100 MG tablet 1/2-1 pill for blood pressure daily 90 tablet 0  . Biotin 5000 MCG TABS Take by mouth daily.    Marland Kitchen buPROPion (WELLBUTRIN XL) 300 MG 24 hr tablet TAKE 1 TABLET BY MOUTH ONCE DAILY 90 tablet PRN  . cetirizine (ZYRTEC) 10 MG tablet Take 10 mg by mouth as needed.     . Cholecalciferol (VITAMIN D-3 PO) Take 1,000 Units by mouth daily.    Marland Kitchen CINNAMON PO Take 1,000 mg by mouth 2 (two) times daily.     Marland Kitchen ezetimibe (ZETIA) 10 MG tablet Take 1 tablet (10 mg total) by mouth daily. 90 tablet 1  . fluticasone (FLONASE) 50 MCG/ACT nasal spray Place 2 sprays into both nostrils daily. 16 g 2  . levothyroxine (SYNTHROID, LEVOTHROID) 112 MCG tablet TAKE 1 TABLET BY MOUTH ONCE DAILY FOR THYROID (Patient taking differently: TAKE 1 TABLET BY M., W, F; 1/2 TABLET Tue, Thurs, Sat., Sun.) 90 tablet 3  . lisinopril-hydrochlorothiazide (PRINZIDE,ZESTORETIC) 20-25 MG tablet TAKE 1 TABLET BY MOUTH DAILY. 90 tablet 2  . MINIVELLE 0.025 MG/24HR   3  . Multiple Vitamins-Minerals (HAIR/SKIN/NAILS PO) Take by mouth daily. 3 tablets daily    . Omega-3 Fatty Acids (FISH OIL) 1000 MG CAPS Take by mouth daily.    . vitamin E 400 UNIT capsule Take 400 Units by mouth daily.     No current facility-administered medications on file prior to visit.    Medical History:  Past Medical History  Diagnosis Date  . Hypertension   . Hyperlipidemia   . Thyroid disease   . Papilledema     bilateral  . Migraine headache   . Anxiety disorder   . History of thyroid cancer   . GERD (gastroesophageal reflux disease)   . Pseudotumor cerebri 04/19/2013  . Vitamin D deficiency   . Cancer [facility])     thyroid    Allergies:  Allergies  Allergen Reactions  . Erythromycin Nausea And Vomiting and Other (See Comments)    Diarrhea - more severe     Review of Systems:  Review of Systems  Constitutional: Negative for fever, chills and malaise/fatigue.  HENT: Positive for congestion. Negative for ear pain and sore  throat.   Eyes: Negative.   Respiratory: Positive for cough. Negative for shortness of breath and wheezing.   Cardiovascular: Negative for chest pain, palpitations and leg swelling.  Gastrointestinal: Negative for heartburn, abdominal pain, diarrhea, constipation, blood in stool and melena.  Genitourinary: Negative.   Skin: Negative.   Neurological: Negative for dizziness, sensory change, loss of consciousness and headaches.  Psychiatric/Behavioral: Negative for depression. The patient is not nervous/anxious and does not have insomnia.     Family history- Review and unchanged  Social history- Review and unchanged  Physical Exam: BP 128/76 mmHg  Pulse 74  Temp(Src) 98.2 F (36.8 C) (Temporal)  Resp 18  Ht 5\' 3"  (1.6 m)  Wt 158 lb (71.668 kg)  BMI 28.00 kg/m2 Wt Readings from Last 3 Encounters:  11/07/15 158 lb (71.668 kg)  08/04/15 159 lb 6.4 oz (72.303 kg)  05/03/15 161 lb (73.029 kg)    General Appearance: Well nourished well developed, in no apparent distress. Eyes: PERRLA, EOMs, conjunctiva no swelling or erythema ENT/Mouth: Ear canals normal without obstruction, swelling, erythma, discharge.  TMs normal bilaterally.  Oropharynx moist, clear, without exudate, or postoropharyngeal swelling. Neck: Supple, thyroid normal,no cervical adenopathy  Respiratory: Respiratory effort normal, Breath sounds clear A&P without rhonchi, wheeze, or rale.  No retractions, no accessory usage. Cardio: RRR with no MRGs. Brisk peripheral pulses without edema.  Abdomen: Soft, + BS,  Non tender, no guarding, rebound, hernias, masses. Musculoskeletal: Full ROM, 5/5 strength, Normal gait Skin: Warm, dry without rashes, lesions, ecchymosis.  Neuro: Awake and oriented X 3, Cranial nerves intact. Normal muscle tone, no cerebellar symptoms. Psych: Normal affect, Insight and Judgment appropriate.    Starlyn Skeans, PA-C 9:35 AM [facility]

## 2015-11-07 NOTE — Patient Instructions (Signed)
Montelukast oral tablets  What is this medicine?  MONTELUKAST (mon te LOO kast) is used to prevent and treat the symptoms of asthma. It is also used to treat allergies. Do not use for an acute asthma attack.  This medicine may be used for other purposes; ask your health care provider or pharmacist if you have questions.  What should I tell my health care provider before I take this medicine?  They need to know if you have any of these conditions:  -liver disease  -an unusual or allergic reaction to montelukast, other medicines, foods, dyes, or preservatives  -pregnant or trying to get pregnant  -breast-feeding  How should I use this medicine?  This medicine should be given by mouth. Follow the directions on the prescription label. Take this medicine at the same time every day. You may take this medicine with or without meals. Do not chew the tablets. Do not stop taking your medicine unless your doctor tells you to.  Talk to your pediatrician regarding the use of this medicine in children. Special care may be needed. While this drug may be prescribed for children as young as 15 years of age for selected conditions, precautions do apply.  Overdosage: If you think you have taken too much of this medicine contact a poison control center or emergency room at once.  NOTE: This medicine is only for you. Do not share this medicine with others.  What if I miss a dose?  If you miss a dose, take it as soon as you can. If it is almost time for your next dose, take only that dose. Do not take double or extra doses.  What may interact with this medicine?  -anti-infectives like rifampin and rifabutin  -medicines for diabetes like rosiglitazone and repaglinide  -medicines for seizures like phenytoin, phenobarbital, and carbamazepine  -paclitaxel  This list may not describe all possible interactions. Give your health care provider a list of all the medicines, herbs, non-prescription drugs, or dietary supplements you use. Also tell  them if you smoke, drink alcohol, or use illegal drugs. Some items may interact with your medicine.  What should I watch for while using this medicine?  Visit your doctor or health care professional for regular checks on your progress. Tell your doctor or health care professional if your allergy or asthma symptoms do not improve. Take your medicine even when you do not have symptoms. Do not stop taking any of your medicine(s) unless your doctor tells you to.  If you have asthma, talk to your doctor about what to do in an acute asthma attack. Always have your inhaled rescue medicine for asthma attacks with you.  Patients and their families should watch for new or worsening thoughts of suicide or depression. Also watch for sudden changes in feelings such as feeling anxious, agitated, panicky, irritable, hostile, aggressive, impulsive, severely restless, overly excited and hyperactive, or not being able to sleep. Any worsening of mood or thoughts of suicide or dying should be reported to your health care professional right away.  What side effects may I notice from receiving this medicine?  Side effects that you should report to your doctor or health care professional as soon as possible:  -allergic reactions like skin rash or hives, or swelling of the face, lips, or tongue  -breathing problems  -confusion  -dark urine  -fever or infection  -flu-like symptoms  -hallucinations  -painful lumps under the skin  -pain, tingling, numbness in the hands or feet  -  sinus pain or swelling  -suicidal thoughts or other mood changes  -trouble sleeping  -unusual bleeding or bruising  -yellowing of the eyes or skin  Side effects that usually do not require medical attention (report to your doctor or health care professional if they continue or are bothersome):  -cough  -dizziness  -drowsiness  -headache  -nightmares  -stomach upset  -stuffy nose  This list may not describe all possible side effects. Call your doctor for medical advice  about side effects. You may report side effects to FDA at 1-800-FDA-1088.  Where should I keep my medicine?  Keep out of the reach of children.  Store at room temperature between 15 and 30 degrees C (59 and 86 degrees F). Protect from light and moisture. Keep this medicine in the original bottle. Throw away any unused medicine after the expiration date.  NOTE: This sheet is a summary. It may not cover all possible information. If you have questions about this medicine, talk to your doctor, pharmacist, or health care provider.     © 2016, [employer]. (2012-07-21 11:09:07)

## 2015-11-14 ENCOUNTER — Other Ambulatory Visit: Payer: Self-pay | Admitting: Physician Assistant

## 2015-11-14 ENCOUNTER — Other Ambulatory Visit: Payer: Self-pay | Admitting: Internal Medicine

## 2015-11-14 MED FILL — LEVOTHYROXINE 112 MCG TAB: 112 | 90 days supply | Qty: 90 | Fill #0

## 2015-11-14 MED FILL — ATENOLOL 100 MG TABLET: 100 | 90 days supply | Qty: 90 | Fill #0

## 2015-12-10 MED FILL — EZETIMIBE 10 MG TABLET: 10 | 90 days supply | Qty: 90 | Fill #1

## 2015-12-10 MED FILL — LISINOPRIL-HCTZ 20-25 MG TA: 20-25 | 90 days supply | Qty: 90 | Fill #2

## 2015-12-10 MED FILL — BUPROPION HCL XL 300 MG TAB: 300 | 90 days supply | Qty: 90 | Fill #2

## 2016-02-07 ENCOUNTER — Encounter: Payer: Self-pay | Admitting: Internal Medicine

## 2016-02-07 ENCOUNTER — Ambulatory Visit (INDEPENDENT_AMBULATORY_CARE_PROVIDER_SITE_OTHER): Payer: 59 | Admitting: Internal Medicine

## 2016-02-07 VITALS — BP 122/80 | HR 68 | Temp 97.8°F | Resp 16 | Ht 63.0 in | Wt 160.6 lb

## 2016-02-07 DIAGNOSIS — E039 Hypothyroidism, unspecified: Secondary | ICD-10-CM

## 2016-02-07 DIAGNOSIS — Z79899 Other long term (current) drug therapy: Secondary | ICD-10-CM | POA: Diagnosis not present

## 2016-02-07 DIAGNOSIS — R7303 Prediabetes: Secondary | ICD-10-CM | POA: Diagnosis not present

## 2016-02-07 DIAGNOSIS — E559 Vitamin D deficiency, unspecified: Secondary | ICD-10-CM | POA: Diagnosis not present

## 2016-02-07 DIAGNOSIS — E782 Mixed hyperlipidemia: Secondary | ICD-10-CM

## 2016-02-07 DIAGNOSIS — I10 Essential (primary) hypertension: Secondary | ICD-10-CM

## 2016-02-07 LAB — CBC WITH DIFFERENTIAL/PLATELET
BASOS ABS: 52 {cells}/uL (ref 0–200)
Basophils Relative: 1 %
EOS ABS: 52 {cells}/uL (ref 15–500)
Eosinophils Relative: 1 %
HEMATOCRIT: 41.7 % (ref 35.0–45.0)
HEMOGLOBIN: 13.7 g/dL (ref 11.7–15.5)
LYMPHS ABS: 1560 {cells}/uL (ref 850–3900)
LYMPHS PCT: 30 %
MCH: 31.9 pg (ref 27.0–33.0)
MCHC: 32.9 g/dL (ref 32.0–36.0)
MCV: 97.2 fL (ref 80.0–100.0)
MONO ABS: 364 {cells}/uL (ref 200–950)
MPV: 10.8 fL (ref 7.5–12.5)
Monocytes Relative: 7 %
NEUTROS PCT: 61 %
Neutro Abs: 3172 cells/uL (ref 1500–7800)
Platelets: 283 10*3/uL (ref 140–400)
RBC: 4.29 MIL/uL (ref 3.80–5.10)
RDW: 13.7 % (ref 11.0–15.0)
WBC: 5.2 10*3/uL (ref 3.8–10.8)

## 2016-02-07 LAB — HEPATIC FUNCTION PANEL
ALK PHOS: 55 U/L (ref 33–130)
ALT: 19 U/L (ref 6–29)
AST: 18 U/L (ref 10–35)
Albumin: 4.5 g/dL (ref 3.6–5.1)
BILIRUBIN DIRECT: 0.1 mg/dL (ref <=0.2)
BILIRUBIN INDIRECT: 0.4 mg/dL (ref 0.2–1.2)
BILIRUBIN TOTAL: 0.5 mg/dL (ref 0.2–1.2)
Total Protein: 7 g/dL (ref 6.1–8.1)

## 2016-02-07 LAB — LIPID PANEL
CHOL/HDL RATIO: 2.5 ratio (ref <=5.0)
Cholesterol: 206 mg/dL — ABNORMAL HIGH (ref 125–200)
HDL: 81 mg/dL (ref >=46)
LDL CALC: 110 mg/dL (ref <130)
Triglycerides: 76 mg/dL (ref <150)
VLDL: 15 mg/dL (ref <30)

## 2016-02-07 LAB — BASIC METABOLIC PANEL WITH GFR
BUN: 19 mg/dL (ref 7–25)
CO2: 27 mmol/L (ref 20–31)
Calcium: 9.4 mg/dL (ref 8.6–10.4)
Chloride: 99 mmol/L (ref 98–110)
Creat: 1.13 mg/dL — ABNORMAL HIGH (ref 0.50–1.05)
GFR, EST NON AFRICAN AMERICAN: 53 mL/min — AB (ref >=60)
GFR, Est African American: 61 mL/min (ref >=60)
GLUCOSE: 112 mg/dL — AB (ref 65–99)
Potassium: 4.1 mmol/L (ref 3.5–5.3)
Sodium: 139 mmol/L (ref 135–146)

## 2016-02-07 LAB — HEMOGLOBIN A1C
Hgb A1c MFr Bld: 5.5 % (ref <5.7)
Mean Plasma Glucose: 111 mg/dL

## 2016-02-07 LAB — MAGNESIUM: MAGNESIUM: 2.3 mg/dL (ref 1.5–2.5)

## 2016-02-07 LAB — TSH: TSH: 2.22 m[IU]/L

## 2016-02-07 MED ORDER — VITAMIN D 50 MCG (2000 UT) PO CAPS: ORAL_CAPSULE | ORAL | Status: DC

## 2016-02-07 NOTE — Patient Instructions (Signed)

## 2016-02-07 NOTE — Progress Notes (Signed)
[facility]                       Unk Pinto, M.D.        Uvaldo Bristle. Silverio Lay, P.A.-C       Starlyn Skeans, P.A.-C   [facility]                [address] [place] [id number] Telephone [phone] Telefax [phone] ______________________________________________________________________     This very nice 60 y.o. DBF presents for 3 month follow up with Hypertension, Hyperlipidemia, Pre-Diabetes and Vitamin D Deficiency.     Patient is treated for HTN circa 1992 & BP has been controlled at home. Today's BP: 122/80. Patient has had no complaints of any cardiac type chest pain, palpitations, dyspnea/orthopnea/PND, dizziness, claudication, or dependent edema.     Hyperlipidemia is controlled with diet & meds. Patient denies myalgias or other med SE's. Last Lipids were  Lab Results  Component Value Date   CHOL 205 (H) 11/07/2015   HDL 80 11/07/2015   LDLCALC 108 11/07/2015   TRIG 87 11/07/2015   CHOLHDL 2.6 11/07/2015      Also, the patient has history of PreDiabetes with A1c 5.8% in 2011  and has had no symptoms of reactive hypoglycemia, diabetic polys, paresthesias or visual blurring.  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 Further, the patient also has history of Vitamin D Deficiency  Of "77" in 2008 and supplements vitamin D without any suspected side-effects. Last vitamin D was   Lab Results  Component Value Date   VD25OH 47 08/04/2015   Current Outpatient Prescriptions on File Prior to Visit  Medication Sig  . aspirin 81 MG tablet Take 81 mg by mouth daily.    Marland Kitchen atenolol (TENORMIN) 100 MG tablet TAKE 1/2 TO 1 TABLET BY MOUTH DAILY FOR BLOOD PRESSURE  . Biotin 5000  MCG TABS Take by mouth daily.  Marland Kitchen buPROPion (WELLBUTRIN XL) 300 MG 24 hr tablet TAKE 1 TABLET BY MOUTH ONCE DAILY  . Cholecalciferol (VITAMIN D-3 PO) Take 1,000 Units by mouth daily.  Marland Kitchen CINNAMON PO Take 1,000 mg by mouth 2 (two) times daily.   Marland Kitchen ezetimibe (ZETIA) 10 MG tablet Take 1 tablet (10 mg total) by mouth daily.  . fluticasone (FLONASE) 50 MCG/ACT nasal spray Place 2 sprays into both nostrils daily.  Marland Kitchen levothyroxine (SYNTHROID, LEVOTHROID) 112 MCG tablet TAKE 1 TABLET BY MOUTH ONCE DAILY FOR THYROID  . lisinopril-hydrochlorothiazide (PRINZIDE,ZESTORETIC) 20-25 MG tablet TAKE 1 TABLET BY MOUTH DAILY.  Marland Kitchen MINIVELLE 0.025 MG/24HR   . montelukast (SINGULAIR) 10 MG tablet Take 1 tablet (10 mg total) by mouth daily.  . Multiple Vitamins-Minerals (HAIR/SKIN/NAILS PO) Take by mouth daily. 3 tablets daily  . Omega-3 Fatty Acids (FISH OIL) 1000 MG CAPS Take by mouth daily.  . vitamin E 400 UNIT capsule Take 400 Units by mouth daily.   No current facility-administered medications on file prior to visit.    Allergies  Allergen Reactions  . Erythromycin Nausea And Vomiting and Other (See Comments)    Diarrhea - more severe  PMHx:   Past Medical History:  Diagnosis Date  . Anxiety disorder   . Cancer ([place])    thyroid  . GERD (gastroesophageal reflux disease)   . History of thyroid cancer   . Hyperlipidemia   . Hypertension   . Migraine headache   . Papilledema    bilateral  . Pseudotumor cerebri 04/19/2013  . Thyroid disease   . Vitamin D deficiency    Immunization History  Administered Date(s) Administered  . DTaP 12/14/1994  . Hepatitis B 07/15/1989  . PPD Test 07/18/2014, 08/04/2015  . Pneumococcal Polysaccharide-23 10/09/1998  . Tdap 05/03/2015   Past Surgical History:  Procedure Laterality Date  . ABDOMINAL HYSTERECTOMY  2005  . THYROIDECTOMY  04/25/11   FHx:    Reviewed / unchanged  SHx:    Reviewed / unchanged  Systems Review:  Constitutional: Denies fever,  chills, wt changes, headaches, insomnia, fatigue, night sweats, change in appetite. Eyes: Denies redness, blurred vision, diplopia, discharge, itchy, watery eyes.  ENT: Denies discharge, congestion, post nasal drip, epistaxis, sore throat, earache, hearing loss, dental pain, tinnitus, vertigo, sinus pain, snoring.  CV: Denies chest pain, palpitations, irregular heartbeat, syncope, dyspnea, diaphoresis, orthopnea, PND, claudication or edema. Respiratory: denies cough, dyspnea, DOE, pleurisy, hoarseness, laryngitis, wheezing.  Gastrointestinal: Denies dysphagia, odynophagia, heartburn, reflux, water brash, abdominal pain or cramps, nausea, vomiting, bloating, diarrhea, constipation, hematemesis, melena, hematochezia  or hemorrhoids. Genitourinary: Denies dysuria, frequency, urgency, nocturia, hesitancy, discharge, hematuria or flank pain. Musculoskeletal: Denies arthralgias, myalgias, stiffness, jt. swelling, pain, limping or strain/sprain.  Skin: Denies pruritus, rash, hives, warts, acne, eczema or change in skin lesion(s). Neuro: No weakness, tremor, incoordination, spasms, paresthesia or pain. Psychiatric: Denies confusion, memory loss or sensory loss. Endo: Denies change in weight, skin or hair change.  Heme/Lymph: No excessive bleeding, bruising or enlarged lymph nodes.  Physical Exam  BP 122/80   Pulse 68   Temp 97.8 F (36.6 C)   Resp 16   Ht 5\' 3"  (1.6 m)   Wt 160 lb 9.6 oz (72.8 kg)   BMI 28.45 kg/m   Appears well nourished and in no distress.  Eyes: PERRLA, EOMs, conjunctiva no swelling or erythema. Sinuses: No frontal/maxillary tenderness ENT/Mouth: EAC's clear, TM's nl w/o erythema, bulging. Nares clear w/o erythema, swelling, exudates. Oropharynx clear without erythema or exudates. Oral hygiene is good. Tongue normal, non obstructing. Hearing intact.  Neck: Supple. Thyroid nl. Car 2+/2+ without bruits, nodes or JVD. Chest: Respirations nl with BS clear & equal w/o rales,  rhonchi, wheezing or stridor.  Cor: Heart sounds normal w/ regular rate and rhythm without sig. murmurs, gallops, clicks, or rubs. Peripheral pulses normal and equal  without edema.  Abdomen: Soft & bowel sounds normal. Non-tender w/o guarding, rebound, hernias, masses, or organomegaly.  Lymphatics: Unremarkable.  Musculoskeletal: Full ROM all peripheral extremities, joint stability, 5/5 strength, and normal gait.  Skin: Warm, dry without exposed rashes, lesions or ecchymosis apparent.  Neuro: Cranial nerves intact, reflexes equal bilaterally. Sensory-motor testing grossly intact. Tendon reflexes grossly intact.  Pysch: Alert & oriented x 3.  Insight and judgement nl & appropriate. No ideations.  Assessment and Plan:  1. Essential hypertension  - Continue medication, monitor blood pressure at home. Continue DASH diet. Reminder to go to the ER if any CP, SOB, nausea, dizziness, severe HA, changes vision/speech, left arm numbness and tingling and jaw pain. - TSH  2. Hyperlipidemia  - Continue diet/meds, exercise,& lifestyle modifications. Continue monitor periodic cholesterol/liver & renal functions  - Lipid  panel - TSH  3. Prediabetes  - Continue diet, exercise, lifestyle modifications. Monitor appropriate labs. - Hemoglobin A1c - Insulin, random  4. Hypothyroidism  - TSH  5. Medication management  - CBC with Differential/Platelet - BASIC METABOLIC PANEL WITH GFR - Hepatic function panel - Magnesium  6. Vitamin D deficiency  - Continue supplementation. - VITAMIN D 25 Hydroxy    Recommended regular exercise, BP monitoring, weight control, and discussed med and SE's. Recommended labs to assess and monitor clinical status. Further disposition pending results of labs. Over 30 minutes of exam, counseling, chart review was performed

## 2016-02-08 LAB — VITAMIN D 25 HYDROXY (VIT D DEFICIENCY, FRACTURES): VIT D 25 HYDROXY: 68 ng/mL (ref 30–100)

## 2016-02-08 LAB — INSULIN, RANDOM: Insulin: 16.5 u[IU]/mL (ref 2.0–19.6)

## 2016-03-07 MED FILL — BUPROPION HCL XL 300 MG TAB: 300 | 90 days supply | Qty: 90 | Fill #3

## 2016-03-07 MED FILL — LEVOTHYROXINE 112 MCG TAB: 112 | 90 days supply | Qty: 90 | Fill #1

## 2016-03-12 ENCOUNTER — Other Ambulatory Visit: Payer: Self-pay | Admitting: Internal Medicine

## 2016-03-12 MED FILL — LISINOPRIL-HCTZ 20-25 MG TA: 20-25 | 90 days supply | Qty: 90 | Fill #0

## 2016-04-22 ENCOUNTER — Ambulatory Visit (INDEPENDENT_AMBULATORY_CARE_PROVIDER_SITE_OTHER): Payer: 59 | Admitting: Internal Medicine

## 2016-04-22 ENCOUNTER — Encounter: Payer: Self-pay | Admitting: Internal Medicine

## 2016-04-22 VITALS — BP 120/60 | HR 72 | Temp 97.7°F | Resp 16 | Ht 63.0 in | Wt 164.8 lb

## 2016-04-22 DIAGNOSIS — R300 Dysuria: Secondary | ICD-10-CM | POA: Diagnosis not present

## 2016-04-22 MED ORDER — CIPROFLOXACIN HCL 500 MG PO TABS: 500.0000 mg | ORAL_TABLET | Freq: Two times a day (BID) | ORAL | 0 refills | Status: AC

## 2016-04-22 MED FILL — CIPROFLOXACIN HCL 500 MG TA: 500 | 5 days supply | Qty: 10 | Fill #0

## 2016-04-22 NOTE — Progress Notes (Signed)
   Subjective:    Patient ID: Tammy Duran, female    DOB: 1956-01-26, 60 y.o.   MRN: [id number]  HPI  Patient presents to the office for 4 days of dysuria.  She reports that she has been having some burning and some mild hematuria after urination.  She reports that she drank a lot of water which helped.  She did not take any medications.  She reports some mild discomfort on the left flank but that is gone today.  She has not had a UTI in a long time.  She reports that she does not have a history of recurring infections.    Review of Systems  Constitutional: Negative for chills, fatigue and fever.  Gastrointestinal: Negative for abdominal distention, abdominal pain, nausea and vomiting.  Genitourinary: Positive for dysuria, hematuria and urgency. Negative for difficulty urinating, frequency, vaginal bleeding, vaginal discharge and vaginal pain.       Objective:   Physical Exam  Constitutional: She is oriented to person, place, and time. She appears well-developed and well-nourished. No distress.  HENT:  Head: Normocephalic.  Mouth/Throat: Oropharynx is clear and moist. No oropharyngeal exudate.  Eyes: Conjunctivae are normal. No scleral icterus.  Neck: Normal range of motion. Neck supple. No JVD present. No thyromegaly present.  Cardiovascular: Normal rate, regular rhythm, normal heart sounds and intact distal pulses.  Exam reveals no gallop and no friction rub.   No murmur heard. Pulmonary/Chest: Effort normal and breath sounds normal. No respiratory distress. She has no wheezes. She has no rales. She exhibits no tenderness.  Abdominal: Soft. Normal appearance and bowel sounds are normal. She exhibits no distension and no mass. There is tenderness in the suprapubic area. There is no rebound, no guarding and no CVA tenderness.  Musculoskeletal: Normal range of motion.  Lymphadenopathy:    She has no cervical adenopathy.  Neurological: She is alert and oriented to person, place, and  time.  Skin: Skin is warm and dry. She is not diaphoretic.  Psychiatric: She has a normal mood and affect. Her behavior is normal. Judgment and thought content normal.  Nursing note and vitals reviewed.   Vitals:   04/22/16 1351  BP: 120/60  Pulse: 72  Resp: 16  Temp: 97.7 F (36.5 C)         Assessment & Plan:    1. Dysuria  - ciprofloxacin (CIPRO) 500 MG tablet; Take 1 tablet (500 mg total) by mouth 2 (two) times daily.  Dispense: 10 tablet; Refill: 0 - Urinalysis, Routine w reflex microscopic (not at [facility]) - Urine culture -recheck urine at visit next month.

## 2016-04-23 LAB — URINALYSIS, MICROSCOPIC ONLY
Casts: NONE SEEN [LPF]
Crystals: NONE SEEN [HPF]
RBC / HPF: >60 RBC/HPF — AB (ref <=2)
YEAST: NONE SEEN [HPF]

## 2016-04-23 LAB — URINALYSIS, ROUTINE W REFLEX MICROSCOPIC
BILIRUBIN URINE: NEGATIVE
GLUCOSE, UA: NEGATIVE
Ketones, ur: NEGATIVE
Nitrite: NEGATIVE
PROTEIN: NEGATIVE
Specific Gravity, Urine: 1.023 (ref 1.001–1.035)
pH: 7 (ref 5.0–8.0)

## 2016-04-24 LAB — URINE CULTURE

## 2016-05-09 IMAGING — US US SOFT TISSUE HEAD/NECK
1 series · 14 of 24 positions shown · non-contrast
Comparison: Ultrasound of the thyroid 01/21/2011 and 08/30/2010

CLINICAL DATA: 58-year-old female with a history of thyroid
carcinoma. Patient is status post thyroidectomy.

Patient reports a sensation a pulling on the left neck.
EXAM:
THYROID ULTRASOUND
TECHNIQUE: Ultrasound examination of the thyroid gland and adjacent soft
tissues was performed.

[Series 1: us soft tissue head/neck · 0.07mm/px · 14 of 24 slices shown]
[im 1/24]
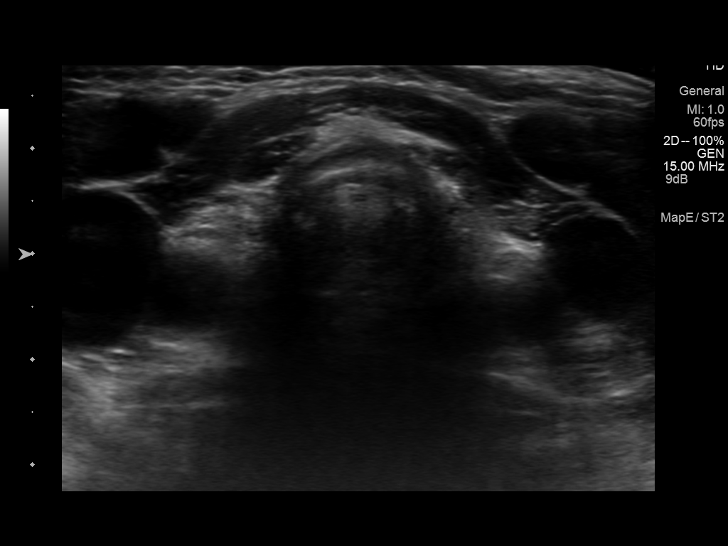
[im 3/24]
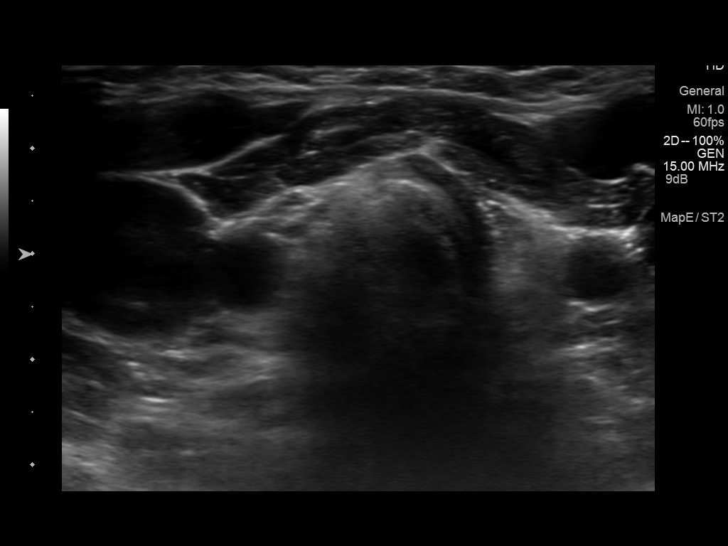
[im 5/24]
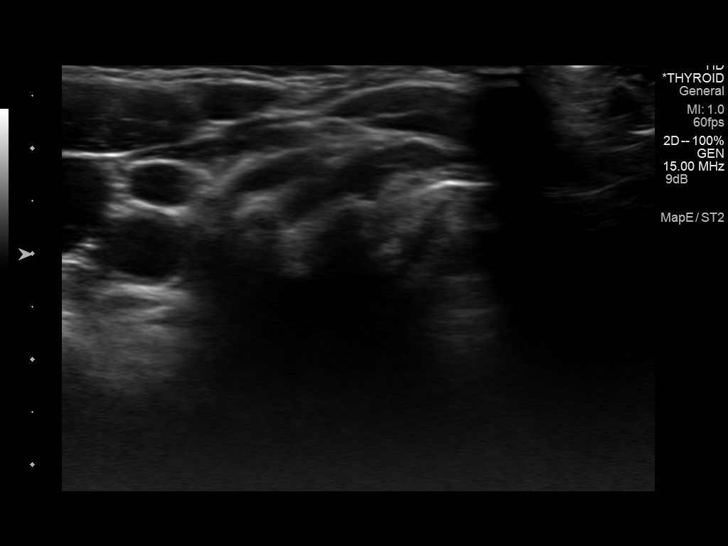
[im 7/24]
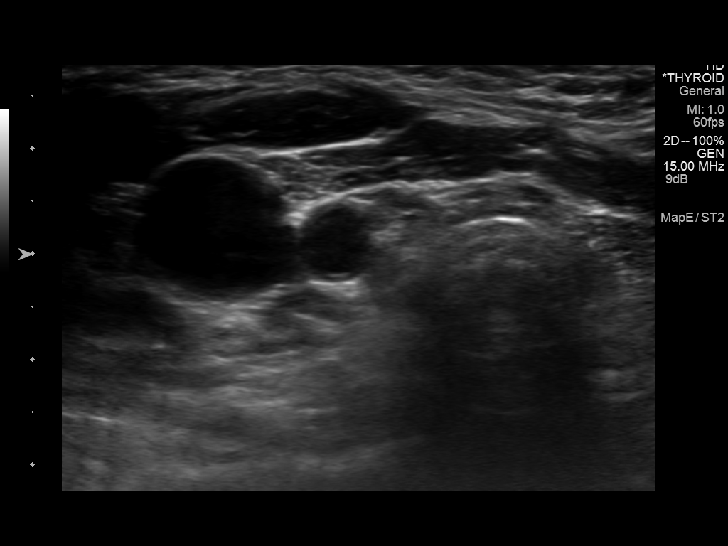
[im 8/24]
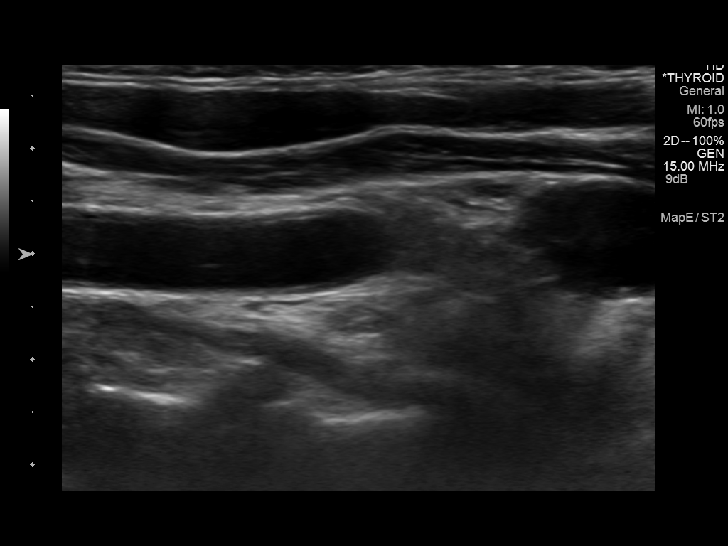
[im 10/24]
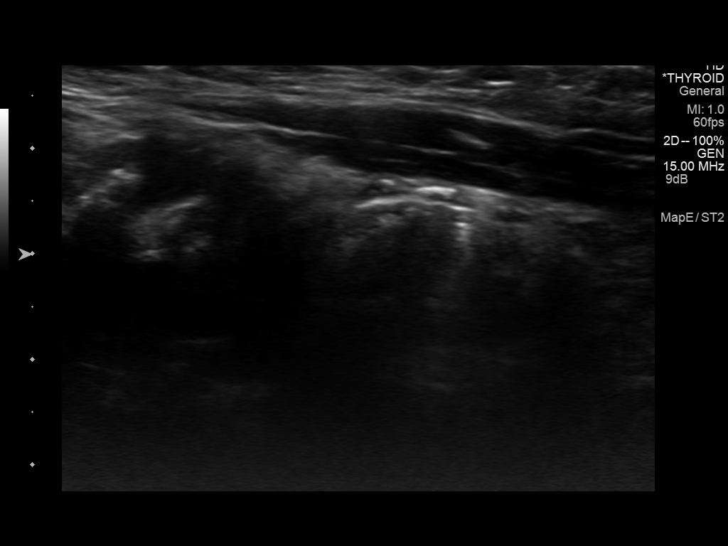
[im 12/24]
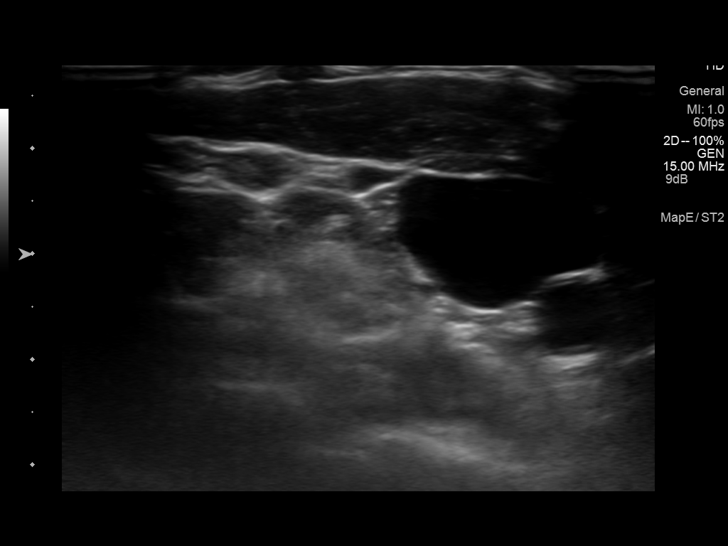
[im 13/24]
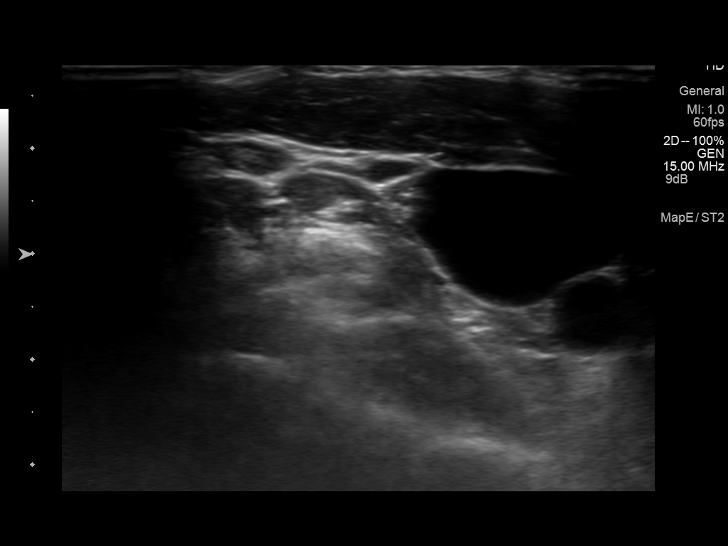
[im 15/24]
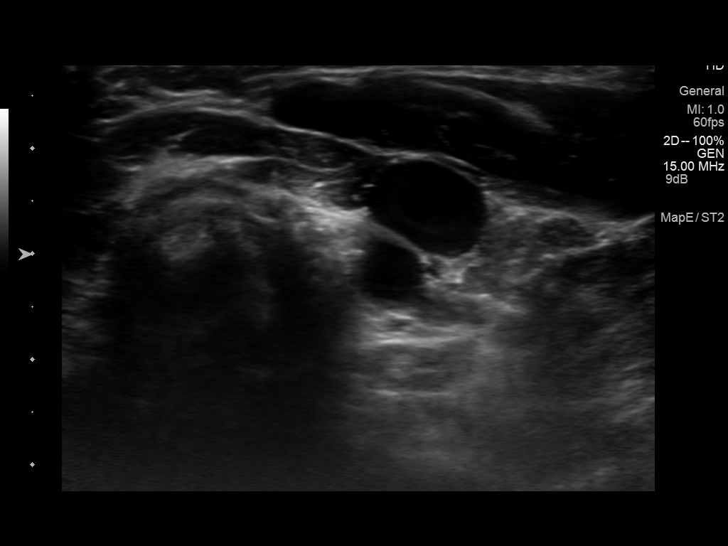
[im 17/24]
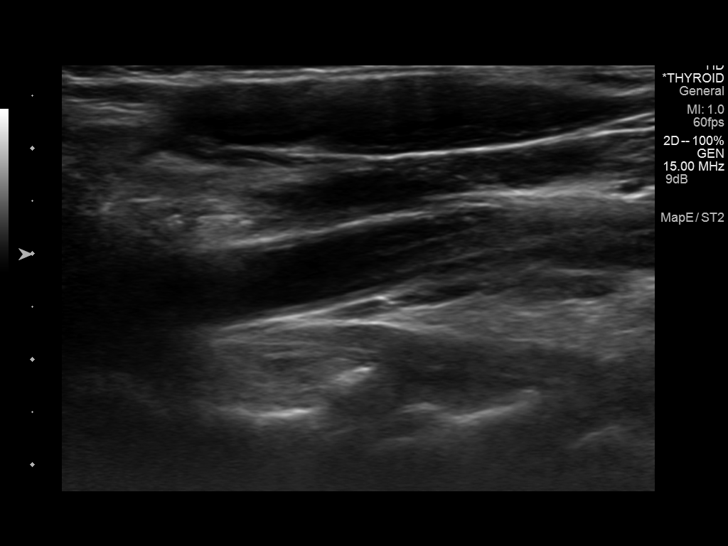
[im 19/24]
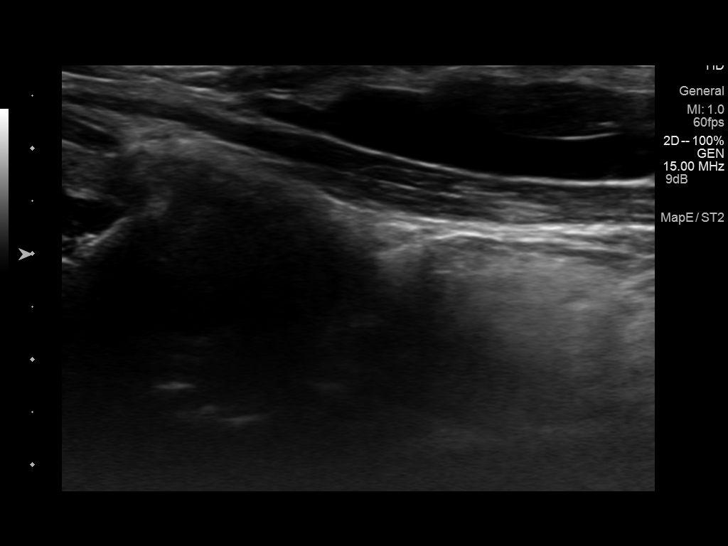
[im 20/24]
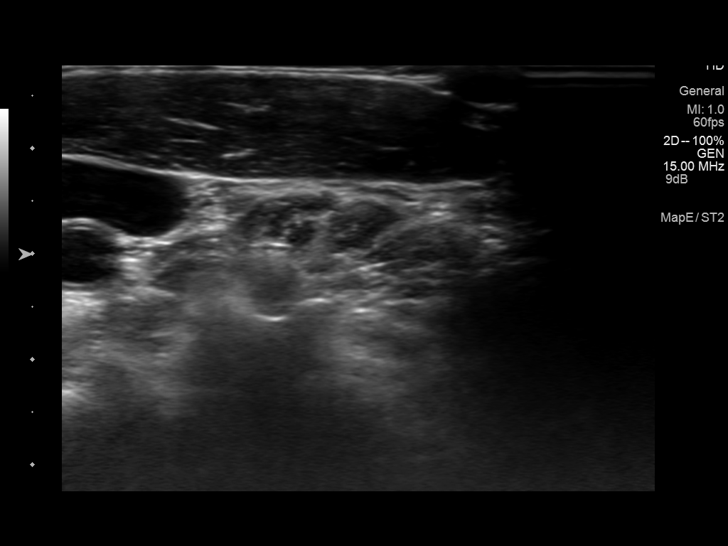
[im 22/24]
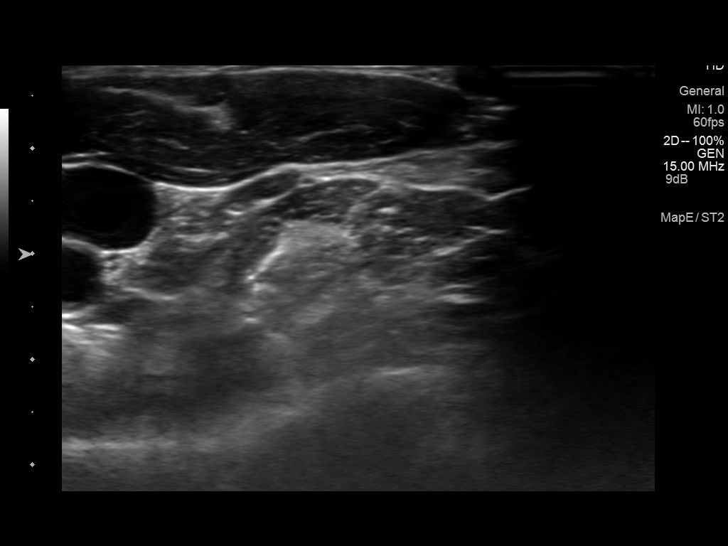
[im 24/24]
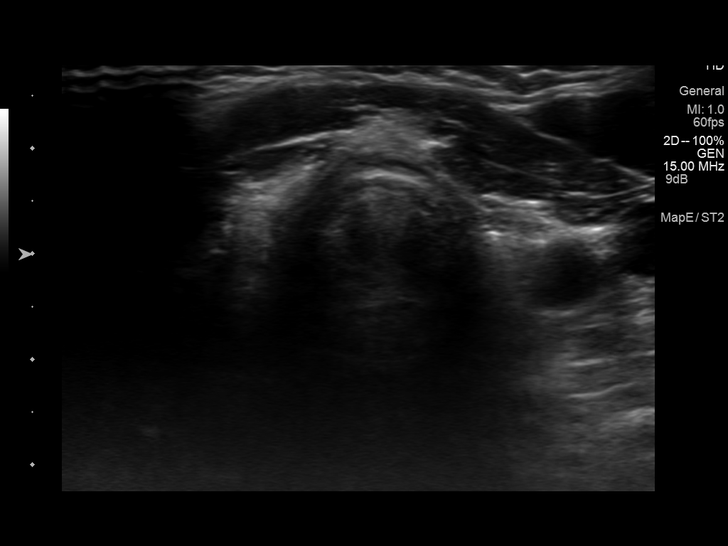

[14 of 24 positions shown; findings below may reference images not displayed]

FINDINGS: Right thyroid lobe

The patient has undergone a thyroidectomy with no residual right
thyroid tissue.

Left thyroid lobe

The patient has undergone a thyroidectomy with no residual left
thyroid tissue.

Lymphadenopathy

None visualized.
IMPRESSION: Unremarkable sonographic survey of the thyroid bed, status post
total thyroidectomy.

## 2016-05-15 ENCOUNTER — Ambulatory Visit (INDEPENDENT_AMBULATORY_CARE_PROVIDER_SITE_OTHER): Payer: 59 | Admitting: Physician Assistant

## 2016-05-15 ENCOUNTER — Encounter: Payer: Self-pay | Admitting: Physician Assistant

## 2016-05-15 VITALS — BP 110/64 | HR 77 | Temp 97.2°F | Resp 16 | Ht 63.0 in | Wt 165.4 lb

## 2016-05-15 DIAGNOSIS — E782 Mixed hyperlipidemia: Secondary | ICD-10-CM | POA: Diagnosis not present

## 2016-05-15 DIAGNOSIS — E559 Vitamin D deficiency, unspecified: Secondary | ICD-10-CM

## 2016-05-15 DIAGNOSIS — R7303 Prediabetes: Secondary | ICD-10-CM

## 2016-05-15 DIAGNOSIS — Z79899 Other long term (current) drug therapy: Secondary | ICD-10-CM | POA: Diagnosis not present

## 2016-05-15 DIAGNOSIS — R319 Hematuria, unspecified: Secondary | ICD-10-CM

## 2016-05-15 DIAGNOSIS — I10 Essential (primary) hypertension: Secondary | ICD-10-CM | POA: Diagnosis not present

## 2016-05-15 LAB — BASIC METABOLIC PANEL WITH GFR
BUN: 9 mg/dL (ref 7–25)
CHLORIDE: 103 mmol/L (ref 98–110)
CO2: 28 mmol/L (ref 20–31)
Calcium: 9.3 mg/dL (ref 8.6–10.4)
Creat: 1.06 mg/dL — ABNORMAL HIGH (ref 0.50–1.05)
GFR, EST AFRICAN AMERICAN: 66 mL/min (ref >=60)
GFR, EST NON AFRICAN AMERICAN: 58 mL/min — AB (ref >=60)
Glucose, Bld: 72 mg/dL (ref 65–99)
POTASSIUM: 4.2 mmol/L (ref 3.5–5.3)
SODIUM: 140 mmol/L (ref 135–146)

## 2016-05-15 LAB — CBC WITH DIFFERENTIAL/PLATELET
BASOS PCT: 0 %
Basophils Absolute: 0 cells/uL (ref 0–200)
EOS ABS: 108 {cells}/uL (ref 15–500)
EOS PCT: 2 %
HCT: 41.4 % (ref 35.0–45.0)
Hemoglobin: 13.5 g/dL (ref 11.7–15.5)
LYMPHS PCT: 29 %
Lymphs Abs: 1566 cells/uL (ref 850–3900)
MCH: 31.9 pg (ref 27.0–33.0)
MCHC: 32.6 g/dL (ref 32.0–36.0)
MCV: 97.9 fL (ref 80.0–100.0)
MONOS PCT: 8 %
MPV: 11.3 fL (ref 7.5–12.5)
Monocytes Absolute: 432 cells/uL (ref 200–950)
NEUTROS ABS: 3294 {cells}/uL (ref 1500–7800)
Neutrophils Relative %: 61 %
PLATELETS: 294 10*3/uL (ref 140–400)
RBC: 4.23 MIL/uL (ref 3.80–5.10)
RDW: 14 % (ref 11.0–15.0)
WBC: 5.4 10*3/uL (ref 3.8–10.8)

## 2016-05-15 LAB — HEPATIC FUNCTION PANEL
ALBUMIN: 4.2 g/dL (ref 3.6–5.1)
ALK PHOS: 57 U/L (ref 33–130)
ALT: 21 U/L (ref 6–29)
AST: 21 U/L (ref 10–35)
BILIRUBIN DIRECT: 0.1 mg/dL (ref <=0.2)
BILIRUBIN INDIRECT: 0.4 mg/dL (ref 0.2–1.2)
BILIRUBIN TOTAL: 0.5 mg/dL (ref 0.2–1.2)
Total Protein: 6.9 g/dL (ref 6.1–8.1)

## 2016-05-15 LAB — LIPID PANEL
CHOLESTEROL: 198 mg/dL (ref 125–200)
HDL: 74 mg/dL (ref >=46)
LDL Cholesterol: 100 mg/dL (ref <130)
Total CHOL/HDL Ratio: 2.7 Ratio (ref <=5.0)
Triglycerides: 118 mg/dL (ref <150)
VLDL: 24 mg/dL (ref <30)

## 2016-05-15 LAB — TSH: TSH: 1.99 mIU/L

## 2016-05-15 NOTE — Progress Notes (Signed)
Assessment and Plan:  Hypertension:  -Continue medication,  -monitor blood pressure at home.  -Continue DASH diet.   -Reminder to go to the ER if any CP, SOB, nausea, dizziness, severe HA, changes vision/speech, left arm numbness and tingling, and jaw pain.  Cholesterol: -Continue diet and exercise.  -Check cholesterol.   Pre-diabetes: -Continue diet and exercise.  -Check A1C  Vitamin D Def: -check level -continue medications.   Hypothyroidism -check TSH level, continue medications the same, reminded to take on an empty stomach 30-5mins before food.   UTI Recheck urine  Continue diet and meds as discussed. Further disposition pending results of labs.  HPI 60 y.o. AA female  presents for 3 month follow up with hypertension, hyperlipidemia, prediabetes and vitamin D.  Had UTI 1 month ago, no symptoms at this time, will recheck urine.   Her blood pressure has been controlled at home, today their BP is BP: 110/64.   She does workout. She denies chest pain, shortness of breath, dizziness.   She is on cholesterol medication and denies myalgias. Her cholesterol is not at goal. The cholesterol last visit was:   Lab Results  Component Value Date   CHOL 206 (H) 02/07/2016   HDL 81 02/07/2016   LDLCALC 110 02/07/2016   TRIG 76 02/07/2016   CHOLHDL 2.5 02/07/2016    She has been working on diet and exercise for prediabetes, and denies foot ulcerations, hyperglycemia, hypoglycemia , increased appetite, nausea, paresthesia of the feet, polydipsia, polyuria, visual disturbances, vomiting and weight loss. Last A1C in the office was:  Lab Results  Component Value Date   HGBA1C 5.5 02/07/2016   Patient is on Vitamin D supplement.  Lab Results  Component Value Date   VD25OH 68 02/07/2016   She is on thyroid medication. Her medication was not changed last visit.   Lab Results  Component Value Date   TSH 2.22 02/07/2016   BMI is Body mass index is 29.3 kg/m., she is working on  diet and exercise. Wt Readings from Last 3 Encounters:  05/15/16 165 lb 6.4 oz (75 kg)  04/22/16 164 lb 12.8 oz (74.8 kg)  02/07/16 160 lb 9.6 oz (72.8 kg)    Current Medications:  Current Outpatient Prescriptions on File Prior to Visit  Medication Sig Dispense Refill  . aspirin 81 MG tablet Take 81 mg by mouth daily.      Marland Kitchen atenolol (TENORMIN) 100 MG tablet TAKE 1/2 TO 1 TABLET BY MOUTH DAILY FOR BLOOD PRESSURE 90 tablet 3  . Biotin 5000 MCG TABS Take by mouth daily.    Marland Kitchen buPROPion (WELLBUTRIN XL) 300 MG 24 hr tablet TAKE 1 TABLET BY MOUTH ONCE DAILY 90 tablet PRN  . Cholecalciferol (VITAMIN D) 2000 units CAPS take 3 caps (6,000 units) / daily 30 capsule   . CINNAMON PO Take 1,000 mg by mouth 2 (two) times daily.     Marland Kitchen ezetimibe (ZETIA) 10 MG tablet Take 1 tablet (10 mg total) by mouth daily. 90 tablet 1  . fexofenadine (ALLEGRA) 180 MG tablet Take 180 mg by mouth daily. Patient OTC Allegra PRN    . fluticasone (FLONASE) 50 MCG/ACT nasal spray Place 2 sprays into both nostrils daily. 16 g 2  . levothyroxine (SYNTHROID, LEVOTHROID) 112 MCG tablet TAKE 1 TABLET BY MOUTH ONCE DAILY FOR THYROID 90 tablet 3  . lisinopril-hydrochlorothiazide (PRINZIDE,ZESTORETIC) 20-25 MG tablet TAKE 1 TABLET BY MOUTH ONCE DAILY 90 tablet 2  . MINIVELLE 0.025 MG/24HR   3  .  Multiple Vitamins-Minerals (HAIR/SKIN/NAILS PO) Take by mouth daily. 3 tablets daily    . Omega-3 Fatty Acids (FISH OIL) 1000 MG CAPS Take by mouth daily.    . vitamin E 400 UNIT capsule Take 400 Units by mouth daily.     No current facility-administered medications on file prior to visit.     Medical History:  Past Medical History:  Diagnosis Date  . Anxiety disorder   . Cancer ([place])    thyroid  . GERD (gastroesophageal reflux disease)   . History of thyroid cancer   . Hyperlipidemia   . Hypertension   . Migraine headache   . Papilledema    bilateral  . Pseudotumor cerebri 04/19/2013  . Thyroid disease   . Vitamin D  deficiency     Allergies:  Allergies  Allergen Reactions  . Erythromycin Nausea And Vomiting and Other (See Comments)    Diarrhea - more severe     Review of Systems:  Review of Systems  Constitutional: Negative for chills, fever and malaise/fatigue.  HENT: Negative for congestion, ear pain and sore throat.   Eyes: Negative.   Respiratory: Negative for cough, shortness of breath and wheezing.   Cardiovascular: Negative for chest pain, palpitations and leg swelling.  Gastrointestinal: Negative for abdominal pain, blood in stool, constipation, diarrhea, heartburn and melena.  Genitourinary: Negative.   Skin: Negative.   Neurological: Negative for dizziness, sensory change, loss of consciousness and headaches.  Psychiatric/Behavioral: Negative for depression. The patient is not nervous/anxious and does not have insomnia.     Family history- Review and unchanged  Social history- Review and unchanged  Physical Exam: BP 110/64   Pulse 77   Temp 97.2 F (36.2 C)   Resp 16   Ht 5\' 3"  (1.6 m)   Wt 165 lb 6.4 oz (75 kg)   SpO2 99%   BMI 29.30 kg/m  Wt Readings from Last 3 Encounters:  05/15/16 165 lb 6.4 oz (75 kg)  04/22/16 164 lb 12.8 oz (74.8 kg)  02/07/16 160 lb 9.6 oz (72.8 kg)    General Appearance: Well nourished well developed, in no apparent distress. Eyes: PERRLA, EOMs, conjunctiva no swelling or erythema ENT/Mouth: Ear canals normal without obstruction, swelling, erythma, discharge.  TMs normal bilaterally.  Oropharynx moist, clear, without exudate, or postoropharyngeal swelling. Neck: Supple, thyroid normal,no cervical adenopathy  Respiratory: Respiratory effort normal, Breath sounds clear A&P without rhonchi, wheeze, or rale.  No retractions, no accessory usage. Cardio: RRR with no MRGs. Brisk peripheral pulses without edema.  Abdomen: Soft, + BS,  Non tender, no guarding, rebound, hernias, masses. Musculoskeletal: Full ROM, 5/5 strength, Normal gait Skin:  Warm, dry without rashes, lesions, ecchymosis.  Neuro: Awake and oriented X 3, Cranial nerves intact. Normal muscle tone, no cerebellar symptoms. Psych: Normal affect, Insight and Judgment appropriate.    Vicie Mutters, PA-C 8:53 AM [facility]

## 2016-05-15 NOTE — Patient Instructions (Addendum)
HOME CARE INSTRUCTIONS   Do not stand or sit in one position for long periods of time. Do not sit with your legs crossed. Rest with your legs raised during the day.  Your legs have to be higher than your heart so that gravity will force the valves to open, so please really elevate your legs.   Wear elastic stockings or support hose. Do not wear other tight, encircling garments around the legs, pelvis, or waist.  [employer]  has a wide variety of well priced compression stockings. [place], [place] [phone]  Walk as much as possible to increase blood flow.  Raise the foot of your bed at night with 2-inch blocks. SEEK MEDICAL CARE IF:   The skin around your ankle starts to break down.  You have pain, redness, tenderness, or hard swelling developing in your leg over a vein.  You are uncomfortable due to leg pain. Document Released: 04/10/2005 Document Revised: 09/23/2011 Document Reviewed: 08/27/2010 [facility] Patient Information 2014 [place].     Bad carbs also include fruit juice, alcohol, and sweet tea. These are empty calories that do not signal to your brain that you are full.   Please remember the good carbs are still carbs which convert into sugar. So please measure them out no more than 1/2-1 cup of rice, oatmeal, pasta, and beans  Veggies are however free foods! Pile them on.   Not all fruit is created equal. Please see the list below, the fruit at the bottom is higher in sugars than the fruit at the top. Please avoid all dried fruits.

## 2016-05-16 LAB — URINALYSIS, ROUTINE W REFLEX MICROSCOPIC
BILIRUBIN URINE: NEGATIVE
Glucose, UA: NEGATIVE
Hgb urine dipstick: NEGATIVE
Ketones, ur: NEGATIVE
Leukocytes, UA: NEGATIVE
NITRITE: NEGATIVE
PROTEIN: NEGATIVE
Specific Gravity, Urine: 1.013 (ref 1.001–1.035)
pH: 7 (ref 5.0–8.0)

## 2016-05-16 LAB — VITAMIN D 25 HYDROXY (VIT D DEFICIENCY, FRACTURES): VIT D 25 HYDROXY: 64 ng/mL (ref 30–100)

## 2016-05-16 LAB — MAGNESIUM: Magnesium: 2.3 mg/dL (ref 1.5–2.5)

## 2016-05-16 LAB — URINE CULTURE: ORGANISM ID, BACTERIA: NO GROWTH

## 2016-05-16 LAB — HEMOGLOBIN A1C
Hgb A1c MFr Bld: 5.4 % (ref <5.7)
MEAN PLASMA GLUCOSE: 108 mg/dL

## 2016-05-29 DIAGNOSIS — Z130 Encounter for screening for diseases of the blood and blood-forming organs and certain disorders involving the immune mechanism: Secondary | ICD-10-CM | POA: Diagnosis not present

## 2016-05-29 DIAGNOSIS — Z780 Asymptomatic menopausal state: Secondary | ICD-10-CM | POA: Diagnosis not present

## 2016-05-29 DIAGNOSIS — Z79890 Hormone replacement therapy (postmenopausal): Secondary | ICD-10-CM | POA: Diagnosis not present

## 2016-05-29 DIAGNOSIS — Z124 Encounter for screening for malignant neoplasm of cervix: Secondary | ICD-10-CM | POA: Diagnosis not present

## 2016-05-29 DIAGNOSIS — Z1389 Encounter for screening for other disorder: Secondary | ICD-10-CM | POA: Diagnosis not present

## 2016-05-29 DIAGNOSIS — Z01419 Encounter for gynecological examination (general) (routine) without abnormal findings: Secondary | ICD-10-CM | POA: Diagnosis not present

## 2016-05-29 DIAGNOSIS — Z1231 Encounter for screening mammogram for malignant neoplasm of breast: Secondary | ICD-10-CM | POA: Diagnosis not present

## 2016-05-29 DIAGNOSIS — Z6828 Body mass index (BMI) 28.0-28.9, adult: Secondary | ICD-10-CM | POA: Diagnosis not present

## 2016-05-29 DIAGNOSIS — Z1151 Encounter for screening for human papillomavirus (HPV): Secondary | ICD-10-CM | POA: Diagnosis not present

## 2016-06-10 MED FILL — BUPROPION HCL XL 300 MG TAB: 300 | 90 days supply | Qty: 90 | Fill #4

## 2016-06-11 MED FILL — LISINOPRIL-HCTZ 20-25 MG TA: 20-25 | 90 days supply | Qty: 90 | Fill #1

## 2016-06-11 MED FILL — ESTRADIOL 0.025 MG PATCH: 0.025 | 84 days supply | Qty: 24 | Fill #0

## 2016-07-01 MED FILL — ATENOLOL 100 MG TABLET: 100 | 90 days supply | Qty: 90 | Fill #1

## 2016-07-16 MED FILL — LEVOTHYROXINE 112 MCG TAB: 112 | 90 days supply | Qty: 90 | Fill #2

## 2016-08-23 ENCOUNTER — Ambulatory Visit (INDEPENDENT_AMBULATORY_CARE_PROVIDER_SITE_OTHER): Payer: 59 | Admitting: Internal Medicine

## 2016-08-23 ENCOUNTER — Encounter: Payer: Self-pay | Admitting: Internal Medicine

## 2016-08-23 VITALS — BP 136/82 | HR 68 | Temp 97.3°F | Resp 16 | Ht 63.0 in | Wt 157.6 lb

## 2016-08-23 DIAGNOSIS — I10 Essential (primary) hypertension: Secondary | ICD-10-CM | POA: Diagnosis not present

## 2016-08-23 DIAGNOSIS — E559 Vitamin D deficiency, unspecified: Secondary | ICD-10-CM

## 2016-08-23 DIAGNOSIS — E039 Hypothyroidism, unspecified: Secondary | ICD-10-CM

## 2016-08-23 DIAGNOSIS — Z1212 Encounter for screening for malignant neoplasm of rectum: Secondary | ICD-10-CM

## 2016-08-23 DIAGNOSIS — Z0000 Encounter for general adult medical examination without abnormal findings: Secondary | ICD-10-CM

## 2016-08-23 DIAGNOSIS — Z79899 Other long term (current) drug therapy: Secondary | ICD-10-CM

## 2016-08-23 DIAGNOSIS — R5383 Other fatigue: Secondary | ICD-10-CM | POA: Diagnosis not present

## 2016-08-23 DIAGNOSIS — R7303 Prediabetes: Secondary | ICD-10-CM

## 2016-08-23 DIAGNOSIS — Z136 Encounter for screening for cardiovascular disorders: Secondary | ICD-10-CM

## 2016-08-23 DIAGNOSIS — E782 Mixed hyperlipidemia: Secondary | ICD-10-CM | POA: Diagnosis not present

## 2016-08-23 DIAGNOSIS — Z0001 Encounter for general adult medical examination with abnormal findings: Secondary | ICD-10-CM

## 2016-08-23 LAB — CBC WITH DIFFERENTIAL/PLATELET
Basophils Absolute: 38 cells/uL (ref 0–200)
Basophils Relative: 1 %
EOS PCT: 2 %
Eosinophils Absolute: 76 cells/uL (ref 15–500)
HCT: 44.3 % (ref 35.0–45.0)
HEMOGLOBIN: 14.5 g/dL (ref 11.7–15.5)
LYMPHS ABS: 1406 {cells}/uL (ref 850–3900)
LYMPHS PCT: 37 %
MCH: 31.5 pg (ref 27.0–33.0)
MCHC: 32.7 g/dL (ref 32.0–36.0)
MCV: 96.3 fL (ref 80.0–100.0)
MPV: 11.4 fL (ref 7.5–12.5)
Monocytes Absolute: 304 cells/uL (ref 200–950)
Monocytes Relative: 8 %
NEUTROS PCT: 52 %
Neutro Abs: 1976 cells/uL (ref 1500–7800)
PLATELETS: 294 10*3/uL (ref 140–400)
RBC: 4.6 MIL/uL (ref 3.80–5.10)
RDW: 13.5 % (ref 11.0–15.0)
WBC: 3.8 10*3/uL (ref 3.8–10.8)

## 2016-08-23 LAB — BASIC METABOLIC PANEL WITH GFR
BUN: 12 mg/dL (ref 7–25)
CALCIUM: 9.4 mg/dL (ref 8.6–10.4)
CO2: 32 mmol/L — ABNORMAL HIGH (ref 20–31)
Chloride: 101 mmol/L (ref 98–110)
Creat: 0.98 mg/dL (ref 0.50–1.05)
GFR, EST AFRICAN AMERICAN: 73 mL/min (ref >=60)
GFR, EST NON AFRICAN AMERICAN: 63 mL/min (ref >=60)
Glucose, Bld: 95 mg/dL (ref 65–99)
Potassium: 4.1 mmol/L (ref 3.5–5.3)
SODIUM: 140 mmol/L (ref 135–146)

## 2016-08-23 LAB — LIPID PANEL
CHOL/HDL RATIO: 2.8 ratio (ref <5.0)
CHOLESTEROL: 196 mg/dL (ref <200)
HDL: 71 mg/dL (ref >50)
LDL Cholesterol: 105 mg/dL — ABNORMAL HIGH (ref <100)
TRIGLYCERIDES: 100 mg/dL (ref <150)
VLDL: 20 mg/dL (ref <30)

## 2016-08-23 LAB — HEPATIC FUNCTION PANEL
ALT: 18 U/L (ref 6–29)
AST: 18 U/L (ref 10–35)
Albumin: 4.4 g/dL (ref 3.6–5.1)
Alkaline Phosphatase: 58 U/L (ref 33–130)
BILIRUBIN DIRECT: 0.1 mg/dL (ref <=0.2)
BILIRUBIN INDIRECT: 0.2 mg/dL (ref 0.2–1.2)
BILIRUBIN TOTAL: 0.3 mg/dL (ref 0.2–1.2)
Total Protein: 7.4 g/dL (ref 6.1–8.1)

## 2016-08-23 LAB — IRON AND TIBC
%SAT: 24 % (ref 11–50)
IRON: 81 ug/dL (ref 45–160)
TIBC: 334 ug/dL (ref 250–450)
UIBC: 253 ug/dL (ref 125–400)

## 2016-08-23 LAB — VITAMIN B12: Vitamin B-12: >2000 pg/mL — ABNORMAL HIGH (ref 200–1100)

## 2016-08-23 LAB — TSH: TSH: 1.53 mIU/L

## 2016-08-23 NOTE — Progress Notes (Signed)
[facility] Unk Pinto, M.D.    Uvaldo Bristle. Silverio Lay, P.A.-C      Starlyn Skeans, P.A.-C  [facility]                [address] [place] [id number] Telephone [phone] Telefax [phone]  Annual Screening/Preventative Visit & Comprehensive Evaluation &  Examination     This very nice 61 yo DBF presents for a Screening/Preventative Visit & comprehensive evaluation and management of multiple medical co-morbidities.  Patient has been followed for HTN, Prediabetes, Hyperlipidemia, post-surg. Hypothyroidism and Vitamin D Deficiency.     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       HTN predates circa 1992. Patient's BP has been controlled at home and patient denies any cardiac symptoms as chest pain, palpitations, shortness of breath, dizziness or ankle swelling. Today's BP is at goal - 136/82.      Patient's hyperlipidemia is controlled with diet and medications. Patient denies myalgias or other medication SE's. Last lipids were at goal: Lab Results  Component Value Date   CHOL 198 05/15/2016   HDL 74 05/15/2016   LDLCALC 100 05/15/2016   TRIG 118 05/15/2016   CHOLHDL 2.7 05/15/2016      Patient has prediabetes (A1c 5.8% in 2011) and patient denies reactive hypoglycemic symptoms, visual blurring, diabetic polys, or paresthesias. Last A1c was at goal: Lab Results  Component Value Date   HGBA1C 5.4 05/15/2016      Finally, patient has history of  Vitamin D Deficiency ('56' in 2008) and last Vitamin D was at goal: Lab Results  Component Value Date   VD25OH 64 05/15/2016   Current Outpatient Prescriptions on File Prior to Visit  Medication Sig  . aspirin 81 MG tablet Take 81 mg by mouth daily.    Marland Kitchen atenolol (TENORMIN) 100 MG tablet TAKE 1/2 TO 1 TABLET BY MOUTH DAILY FOR BLOOD PRESSURE  . Biotin 5000 MCG TABS Take by mouth daily.  Marland Kitchen buPROPion (WELLBUTRIN XL) 300 MG 24 hr tablet TAKE 1 TABLET BY MOUTH ONCE DAILY  . Cholecalciferol (VITAMIN D) 2000 units CAPS take 3 caps (6,000 units) / daily  . CINNAMON PO Take 1,000 mg by mouth 2 (two) times daily.   Marland Kitchen ezetimibe (ZETIA) 10 MG tablet Take 1 tablet (10 mg total) by mouth daily.  . fexofenadine (ALLEGRA) 180 MG tablet Take 180 mg by mouth daily. Patient OTC Allegra PRN  . levothyroxine (SYNTHROID, LEVOTHROID) 112 MCG tablet TAKE 1 TABLET BY MOUTH ONCE DAILY FOR THYROID  . lisinopril-hydrochlorothiazide (PRINZIDE,ZESTORETIC) 20-25 MG tablet TAKE 1 TABLET BY MOUTH ONCE DAILY  . MINIVELLE 0.025 MG/24HR   . Multiple Vitamins-Minerals (HAIR/SKIN/NAILS PO) Take by mouth daily. 3 tablets daily  . Omega-3 Fatty Acids (FISH OIL) 1000 MG CAPS Take by mouth daily.  . vitamin E 400 UNIT capsule Take 400 Units by mouth daily.  . fluticasone (FLONASE) 50 MCG/ACT nasal spray Place 2 sprays into both  nostrils daily.   No current facility-administered medications on file prior to visit.    Allergies  Allergen Reactions  . Erythromycin Nausea And Vomiting and Other (See Comments)    Diarrhea - more severe   Past Medical History:  Diagnosis Date  . Anxiety disorder   . Cancer ([place])    thyroid  . GERD (gastroesophageal reflux disease)   . History of thyroid cancer   . Hyperlipidemia   . Hypertension   . Migraine headache   . Papilledema    bilateral  . Pseudotumor cerebri 04/19/2013  . Thyroid disease   . Vitamin D deficiency    Health Maintenance  Topic Date Due  . INFLUENZA VACCINE   02/13/2016  . MAMMOGRAM  05/25/2017  . COLONOSCOPY  08/20/2017  . PAP SMEAR  04/18/2018  . TETANUS/TDAP  05/02/2025  . Hepatitis C Screening  Completed  . HIV Screening  Completed   Immunization History  Administered Date(s) Administered  . DTaP 12/14/1994  . Hepatitis B 07/15/1989  . PPD Test 07/18/2014, 08/04/2015  . Pneumococcal Polysaccharide-23 10/09/1998  . Tdap 05/03/2015   Past Surgical History:  Procedure Laterality Date  . ABDOMINAL HYSTERECTOMY  2005  . THYROIDECTOMY  04/25/11   Family History  Problem Relation Age of Onset  . Diabetes Mother   . Heart disease Mother   . Hypertension Mother   . Hyperlipidemia Mother   . COPD Mother   . Depression Father   . Suicidality Father   . Stroke Brother   . Heart disease Brother   . Cancer Maternal Aunt     lung  . Asthma Son    Social History  Substance Use Topics  . Smoking status: Current Every Day Smoker    Packs/day: 0.20    Types: Cigarettes  . Smokeless tobacco: Never Used     Comment: 1-2 cigarettes per day  . Alcohol use Yes     Comment: weekends    ROS Constitutional: Denies fever, chills, weight loss/gain, headaches, insomnia,  night sweats, and change in appetite. Does c/o fatigue. Eyes: Denies redness, blurred vision, diplopia, discharge, itchy, watery eyes.  ENT: Denies discharge, congestion, post nasal drip, epistaxis, sore throat, earache, hearing loss, dental pain, Tinnitus, Vertigo, Sinus pain, snoring.  Cardio: Denies chest pain, palpitations, irregular heartbeat, syncope, dyspnea, diaphoresis, orthopnea, PND, claudication, edema Respiratory: denies cough, dyspnea, DOE, pleurisy, hoarseness, laryngitis, wheezing.  Gastrointestinal: Denies dysphagia, heartburn, reflux, water brash, pain, cramps, nausea, vomiting, bloating, diarrhea, constipation, hematemesis, melena, hematochezia, jaundice, hemorrhoids Genitourinary: Denies dysuria, frequency, urgency, nocturia, hesitancy, discharge, hematuria,  flank pain Breast: Breast lumps, nipple discharge, bleeding.  Musculoskeletal: Denies arthralgia, myalgia, stiffness, Jt. Swelling, pain, limp, and strain/sprain. Denies falls. Skin: Denies puritis, rash, hives, warts, acne, eczema, changing in skin lesion Neuro: No weakness, tremor, incoordination, spasms, paresthesia, pain Psychiatric: Denies confusion, memory loss, sensory loss. Denies Depression. Endocrine: Denies change in weight, skin, hair change, nocturia, and paresthesia, diabetic polys, visual blurring, hyper / hypo glycemic episodes.  Heme/Lymph: No excessive bleeding, bruising, enlarged lymph nodes.  Physical Exam  BP 136/82   Pulse 68   Temp 97.3 F (36.3 C)   Resp 16   Ht 5\' 3"  (1.6 m)   Wt 157 lb 9.6 oz (71.5 kg)   BMI 27.92 kg/m   General Appearance: Well nourished and in no apparent distress.  Eyes: PERRLA, EOMs, conjunctiva no swelling or erythema, normal fundi and vessels. Sinuses: No frontal/maxillary tenderness ENT/Mouth: EACs patent / TMs  nl. Nares clear without  erythema, swelling, mucoid exudates. Oral hygiene is good. No erythema, swelling, or exudate. Tongue normal, non-obstructing. Tonsils not swollen or erythematous. Hearing normal.  Neck: Supple, thyroid normal. No bruits, nodes or JVD. Respiratory: Respiratory effort normal.  BS equal and clear bilateral without rales, rhonci, wheezing or stridor. Cardio: Heart sounds are normal with regular rate and rhythm and no murmurs, rubs or gallops. Peripheral pulses are normal and equal bilaterally without edema. No aortic or femoral bruits. Chest: symmetric with normal excursions and percussion. Breasts: Symmetric, without lumps, nipple discharge, retractions, or fibrocystic changes.  Abdomen: Flat, soft with bowel sounds active. Nontender, no guarding, rebound, hernias, masses, or organomegaly.  Lymphatics: Non tender without lymphadenopathy.  Musculoskeletal: Full ROM all peripheral extremities, joint  stability, 5/5 strength, and normal gait. Skin: Warm and dry without rashes, lesions, cyanosis, clubbing or  ecchymosis.  Neuro: Cranial nerves intact, reflexes equal bilaterally. Normal muscle tone, no cerebellar symptoms. Sensation intact.  Pysch: Alert and oriented X 3, normal affect, Insight and Judgment appropriate.   Assessment and Plan  1. Annual Preventative Screening Examination  2. Essential hypertension  - Microalbumin / creatinine urine ratio - EKG 12-Lead - [place], RETROPERITNL ABD,  LTD - Urinalysis, Routine w reflex microscopic - CBC with Differential/Platelet - BASIC METABOLIC PANEL WITH GFR - TSH  3. Hyperlipidemia  - EKG 12-Lead - [place], RETROPERITNL ABD,  LTD - Hepatic function panel - Lipid panel - TSH  4. Prediabetes  - EKG 12-Lead - [place], RETROPERITNL ABD,  LTD - Hemoglobin A1c - Insulin, random  5. Vitamin D deficiency - VITAMIN D 25 Hydroxy   6. Hypothyroidism  - TSH  7. Screening for rectal cancer  - POC Hemoccult Bld/Stl   8. Screening for AAA (aortic abdominal aneurysm)  - [place], RETROPERITNL ABD,  LTD  9. Screening for ischemic heart disease  - EKG 12-Lead  10. Fatigue, unspecified type  - Vitamin B12 - Iron and TIBC - CBC with Differential/Platelet  11. Medication management  - Urinalysis, Routine w reflex microscopic - CBC with Differential/Platelet - BASIC METABOLIC PANEL WITH GFR - Hepatic function panel - Magnesium - TSH - VITAMIN D 25 Hydroxy       Continue prudent diet as discussed, weight control, BP monitoring, regular exercise, and medications. Discussed med's effects and SE's. Screening labs and tests as requested with regular follow-up as recommended. Over 40 minutes of exam, counseling, chart review and high complex critical decision making was performed.

## 2016-08-23 NOTE — Patient Instructions (Signed)

## 2016-08-24 LAB — URINALYSIS, ROUTINE W REFLEX MICROSCOPIC
BILIRUBIN URINE: NEGATIVE
GLUCOSE, UA: NEGATIVE
HGB URINE DIPSTICK: NEGATIVE
Ketones, ur: NEGATIVE
LEUKOCYTES UA: NEGATIVE
Nitrite: NEGATIVE
PH: 7.5 (ref 5.0–8.0)
PROTEIN: NEGATIVE
Specific Gravity, Urine: 1.018 (ref 1.001–1.035)

## 2016-08-24 LAB — MICROALBUMIN / CREATININE URINE RATIO
Creatinine, Urine: 108 mg/dL (ref 20–320)
MICROALB UR: 0.2 mg/dL
MICROALB/CREAT RATIO: 2 ug/mg{creat} (ref <30)

## 2016-08-24 LAB — VITAMIN D 25 HYDROXY (VIT D DEFICIENCY, FRACTURES): Vit D, 25-Hydroxy: 69 ng/mL (ref 30–100)

## 2016-08-24 LAB — HEMOGLOBIN A1C
Hgb A1c MFr Bld: 5.3 % (ref <5.7)
MEAN PLASMA GLUCOSE: 105 mg/dL

## 2016-08-24 LAB — MAGNESIUM: Magnesium: 2.2 mg/dL (ref 1.5–2.5)

## 2016-08-26 LAB — INSULIN, RANDOM: Insulin: 7.1 u[IU]/mL (ref 2.0–19.6)

## 2016-09-04 ENCOUNTER — Other Ambulatory Visit: Payer: Self-pay | Admitting: Internal Medicine

## 2016-09-04 MED FILL — EZETIMIBE 10 MG TABLET: 10 | 90 days supply | Qty: 90 | Fill #0

## 2016-09-04 MED FILL — BUPROPION HCL XL 300 MG TAB: 300 | 90 days supply | Qty: 90 | Fill #0

## 2016-09-04 MED FILL — LISINOPRIL-HCTZ 20-25 MG TA: 20-25 | 90 days supply | Qty: 90 | Fill #2

## 2016-09-10 ENCOUNTER — Other Ambulatory Visit: Payer: Self-pay

## 2016-09-10 DIAGNOSIS — Z1212 Encounter for screening for malignant neoplasm of rectum: Secondary | ICD-10-CM

## 2016-09-10 LAB — POC HEMOCCULT BLD/STL (HOME/3-CARD/SCREEN)
FECAL OCCULT BLD: NEGATIVE
FECAL OCCULT BLD: NEGATIVE
Fecal Occult Blood, POC: NEGATIVE

## 2016-09-12 ENCOUNTER — Other Ambulatory Visit: Payer: Self-pay | Admitting: Internal Medicine

## 2016-09-12 MED FILL — FLUTICASONE PROP 50 MCG SPR: 50 | 90 days supply | Qty: 48 | Fill #0

## 2016-10-21 DIAGNOSIS — H52223 Regular astigmatism, bilateral: Secondary | ICD-10-CM | POA: Diagnosis not present

## 2016-10-21 DIAGNOSIS — H524 Presbyopia: Secondary | ICD-10-CM | POA: Diagnosis not present

## 2016-11-18 ENCOUNTER — Other Ambulatory Visit: Payer: Self-pay | Admitting: Internal Medicine

## 2016-11-18 MED FILL — LEVOTHYROXINE 112 MCG TAB: 112 | 90 days supply | Qty: 90 | Fill #0

## 2016-11-27 ENCOUNTER — Encounter: Payer: Self-pay | Admitting: Internal Medicine

## 2016-11-27 ENCOUNTER — Ambulatory Visit (INDEPENDENT_AMBULATORY_CARE_PROVIDER_SITE_OTHER): Payer: 59 | Admitting: Internal Medicine

## 2016-11-27 ENCOUNTER — Ambulatory Visit: Payer: Self-pay | Admitting: Internal Medicine

## 2016-11-27 VITALS — BP 122/66 | HR 60 | Temp 97.6°F | Resp 16 | Ht 63.0 in | Wt 158.0 lb

## 2016-11-27 DIAGNOSIS — E039 Hypothyroidism, unspecified: Secondary | ICD-10-CM

## 2016-11-27 DIAGNOSIS — Z8639 Personal history of other endocrine, nutritional and metabolic disease: Secondary | ICD-10-CM | POA: Diagnosis not present

## 2016-11-27 DIAGNOSIS — E559 Vitamin D deficiency, unspecified: Secondary | ICD-10-CM | POA: Diagnosis not present

## 2016-11-27 DIAGNOSIS — R7303 Prediabetes: Secondary | ICD-10-CM | POA: Diagnosis not present

## 2016-11-27 DIAGNOSIS — Z79899 Other long term (current) drug therapy: Secondary | ICD-10-CM | POA: Diagnosis not present

## 2016-11-27 DIAGNOSIS — I10 Essential (primary) hypertension: Secondary | ICD-10-CM

## 2016-11-27 LAB — BASIC METABOLIC PANEL WITH GFR
BUN: 11 mg/dL (ref 7–25)
CALCIUM: 9.6 mg/dL (ref 8.6–10.4)
CO2: 29 mmol/L (ref 20–31)
Chloride: 102 mmol/L (ref 98–110)
Creat: 0.95 mg/dL (ref 0.50–0.99)
GFR, EST AFRICAN AMERICAN: 75 mL/min (ref >=60)
GFR, Est Non African American: 65 mL/min (ref >=60)
GLUCOSE: 92 mg/dL (ref 65–99)
Potassium: 4.3 mmol/L (ref 3.5–5.3)
SODIUM: 140 mmol/L (ref 135–146)

## 2016-11-27 LAB — CBC WITH DIFFERENTIAL/PLATELET
BASOS ABS: 0 {cells}/uL (ref 0–200)
Basophils Relative: 0 %
EOS PCT: 2 %
Eosinophils Absolute: 92 cells/uL (ref 15–500)
HCT: 41 % (ref 35.0–45.0)
HEMOGLOBIN: 13.4 g/dL (ref 11.7–15.5)
LYMPHS ABS: 1380 {cells}/uL (ref 850–3900)
LYMPHS PCT: 30 %
MCH: 31.9 pg (ref 27.0–33.0)
MCHC: 32.7 g/dL (ref 32.0–36.0)
MCV: 97.6 fL (ref 80.0–100.0)
MPV: 11.5 fL (ref 7.5–12.5)
Monocytes Absolute: 368 cells/uL (ref 200–950)
Monocytes Relative: 8 %
NEUTROS PCT: 60 %
Neutro Abs: 2760 cells/uL (ref 1500–7800)
Platelets: 270 10*3/uL (ref 140–400)
RBC: 4.2 MIL/uL (ref 3.80–5.10)
RDW: 13.6 % (ref 11.0–15.0)
WBC: 4.6 10*3/uL (ref 3.8–10.8)

## 2016-11-27 LAB — HEPATIC FUNCTION PANEL
ALT: 16 U/L (ref 6–29)
AST: 15 U/L (ref 10–35)
Albumin: 4.1 g/dL (ref 3.6–5.1)
Alkaline Phosphatase: 57 U/L (ref 33–130)
BILIRUBIN DIRECT: 0.1 mg/dL (ref <=0.2)
Indirect Bilirubin: 0.3 mg/dL (ref 0.2–1.2)
TOTAL PROTEIN: 6.7 g/dL (ref 6.1–8.1)
Total Bilirubin: 0.4 mg/dL (ref 0.2–1.2)

## 2016-11-27 LAB — LIPID PANEL
CHOLESTEROL: 190 mg/dL (ref <200)
HDL: 71 mg/dL (ref >50)
LDL Cholesterol: 108 mg/dL — ABNORMAL HIGH (ref <100)
Total CHOL/HDL Ratio: 2.7 Ratio (ref <5.0)
Triglycerides: 57 mg/dL (ref <150)
VLDL: 11 mg/dL (ref <30)

## 2016-11-27 LAB — MAGNESIUM: MAGNESIUM: 2.2 mg/dL (ref 1.5–2.5)

## 2016-11-27 LAB — TSH: TSH: 1.29 mIU/L

## 2016-11-27 NOTE — Patient Instructions (Signed)

## 2016-11-27 NOTE — Progress Notes (Signed)
This very nice 61 y.o. DBF presents for 3 month follow up with Hypertension, Hyperlipidemia, Hypothyroidism,  Pre-Diabetes and Vitamin D Deficiency.      Patient is treated for HTN (1992)  & BP has been controlled at home. Today's BP is at goal - 122/66. Patient has had no complaints of any cardiac type chest pain, palpitations, dyspnea/orthopnea/PND, dizziness, claudication, or dependent edema.     Hyperlipidemia is controlled with diet & meds. Patient denies myalgias or other med SE's. Last Lipids were near goal: Lab Results  Component Value Date   CHOL 196 08/23/2016   HDL 71 08/23/2016   LDLCALC 105 (H) 08/23/2016   TRIG 100 08/23/2016   CHOLHDL 2.8 08/23/2016      Also, the patient has history of PreDiabetes since 2011 with A1c 5.8% and has had no symptoms of reactive hypoglycemia, diabetic polys, paresthesias or visual blurring.  Last A1c was at goal: Lab Results  Component Value Date   HGBA1C 5.3 08/23/2016      In 2012, patient had a thyroidectomy for a MNG and was found to have a small focus of papillary Thyroid Cancer and surgery was felt curative. Further, the patient also has history of Vitamin D Deficiency with level "33" in 2008  and supplements vitamin D without any suspected side-effects. Last vitamin D was at goal:  Lab Results  Component Value Date   VD25OH 69 08/23/2016   Current Outpatient Prescriptions on File Prior to Visit  Medication Sig  . aspirin 81 MG tablet Take 81 mg by mouth daily.    Marland Kitchen atenolol (TENORMIN) 100 MG tablet TAKE 1/2 TO 1 TABLET BY MOUTH DAILY FOR BLOOD PRESSURE  . Biotin 5000 MCG TABS Take by mouth daily.  Marland Kitchen buPROPion (WELLBUTRIN XL) 300 MG 24 hr tablet TAKE 1 TABLET BY MOUTH ONCE DAILY  . Cholecalciferol (VITAMIN D) 2000 units CAPS take 3 caps (6,000 units) / daily  . CINNAMON PO Take 1,000 mg by mouth 2 (two) times daily.   Marland Kitchen ezetimibe (ZETIA) 10 MG tablet TAKE 1 TABLET BY MOUTH DAILY.  . fexofenadine (ALLEGRA) 180 MG tablet Take 180  mg by mouth daily. Patient OTC Allegra PRN  . fluticasone (FLONASE) 50 MCG/ACT nasal spray PLACE 2 SPRAYS INTO BOTH NOSTRILS DAILY.  Marland Kitchen levothyroxine (SYNTHROID, LEVOTHROID) 112 MCG tablet TAKE 1 TABLET BY MOUTH ONCE DAILY FOR THYROID  . lisinopril-hydrochlorothiazide (PRINZIDE,ZESTORETIC) 20-25 MG tablet TAKE 1 TABLET BY MOUTH ONCE DAILY  . MINIVELLE 0.025 MG/24HR   . Multiple Vitamins-Minerals (HAIR/SKIN/NAILS PO) Take by mouth daily. 3 tablets daily  . Omega-3 Fatty Acids (FISH OIL) 1000 MG CAPS Take by mouth daily.  . vitamin E 400 UNIT capsule Take 400 Units by mouth daily.   No current facility-administered medications on file prior to visit.    Allergies  Allergen Reactions  . Erythromycin Nausea And Vomiting and Other (See Comments)    Diarrhea - more severe   PMHx:   Past Medical History:  Diagnosis Date  . Anxiety disorder   . Cancer ([place])    thyroid  . GERD (gastroesophageal reflux disease)   . History of thyroid cancer   . Hyperlipidemia   . Hypertension   . Migraine headache   . Papilledema    bilateral  . Pseudotumor cerebri 04/19/2013  . Thyroid disease   . Vitamin D deficiency    Immunization History  Administered Date(s) Administered  . DTaP 12/14/1994  . Hepatitis B 07/15/1989  . PPD Test  07/18/2014, 08/04/2015  . Pneumococcal Polysaccharide-23 10/09/1998  . Tdap 05/03/2015   Past Surgical History:  Procedure Laterality Date  . ABDOMINAL HYSTERECTOMY  2005  . THYROIDECTOMY  04/25/11   FHx:    Reviewed / unchanged  SHx:    Reviewed / unchanged  Systems Review:  Constitutional: Denies fever, chills, wt changes, headaches, insomnia, fatigue, night sweats, change in appetite. Eyes: Denies redness, blurred vision, diplopia, discharge, itchy, watery eyes.  ENT: Denies discharge, congestion, post nasal drip, epistaxis, sore throat, earache, hearing loss, dental pain, tinnitus, vertigo, sinus pain, snoring.  CV: Denies chest pain, palpitations, irregular  heartbeat, syncope, dyspnea, diaphoresis, orthopnea, PND, claudication or edema. Respiratory: denies cough, dyspnea, DOE, pleurisy, hoarseness, laryngitis, wheezing.  Gastrointestinal: Denies dysphagia, odynophagia, heartburn, reflux, water brash, abdominal pain or cramps, nausea, vomiting, bloating, diarrhea, constipation, hematemesis, melena, hematochezia  or hemorrhoids. Genitourinary: Denies dysuria, frequency, urgency, nocturia, hesitancy, discharge, hematuria or flank pain. Musculoskeletal: Denies arthralgias, myalgias, stiffness, jt. swelling, pain, limping or strain/sprain.  Skin: Denies pruritus, rash, hives, warts, acne, eczema or change in skin lesion(s). Neuro: No weakness, tremor, incoordination, spasms, paresthesia or pain. Psychiatric: Denies confusion, memory loss or sensory loss. Endo: Denies change in weight, skin or hair change.  Heme/Lymph: No excessive bleeding, bruising or enlarged lymph nodes.  Physical Exam  BP 122/66   Pulse 60   Temp 97.6 F (36.4 C)   Resp 16   Ht 5\' 3"  (1.6 m)   Wt 158 lb (71.7 kg)   BMI 27.99 kg/m   Appears well nourished, well groomed  and in no distress.  Eyes: PERRLA, EOMs, conjunctiva no swelling or erythema. Sinuses: No frontal/maxillary tenderness ENT/Mouth: EAC's clear, TM's nl w/o erythema, bulging. Nares clear w/o erythema, swelling, exudates. Oropharynx clear without erythema or exudates. Oral hygiene is good. Tongue normal, non obstructing. Hearing intact.  Neck: Supple. Thyroid nl. Car 2+/2+ without bruits, nodes or JVD. Chest: Respirations nl with BS clear & equal w/o rales, rhonchi, wheezing or stridor.  Cor: Heart sounds normal w/ regular rate and rhythm without sig. murmurs, gallops, clicks or rubs. Peripheral pulses normal and equal  without edema.  Abdomen: Soft & bowel sounds normal. Non-tender w/o guarding, rebound, hernias, masses or organomegaly.  Lymphatics: Unremarkable.  Musculoskeletal: Full ROM all peripheral  extremities, joint stability, 5/5 strength and normal gait.  Skin: Warm, dry without exposed rashes, lesions or ecchymosis apparent.  Neuro: Cranial nerves intact, reflexes equal bilaterally. Sensory-motor testing grossly intact. Tendon reflexes grossly intact.  Pysch: Alert & oriented x 3.  Insight and judgement nl & appropriate. No ideations.  Assessment and Plan:  1. Essential hypertension  - Continue medication, monitor blood pressure at home.  - Continue DASH diet. Reminder to go to the ER if any CP,  SOB, nausea, dizziness, severe HA, changes vision/speech,  left arm numbness and tingling and jaw pain.  - CBC with Differential/Platelet - BASIC METABOLIC PANEL WITH GFR - Magnesium - TSH  2. History of hyperlipidemia, mixed  - Continue diet/meds, exercise,& lifestyle modifications.  - Continue monitor periodic cholesterol/liver & renal functions   - Hepatic function panel - Lipid panel - TSH  3. Prediabetes  - Continue diet, exercise, lifestyle modifications.  - Monitor appropriate labs.  - Hemoglobin A1c - Insulin, random  4. Vitamin D deficiency  - Continue supplementation.  - VITAMIN D 25 Hydroxy   5. Hypothyroidism, unspecified type  - TSH  6. Medication management  - CBC with Differential/Platelet - BASIC METABOLIC PANEL WITH GFR -  Hepatic function panel - Magnesium - Lipid panel - TSH - Hemoglobin A1c - Insulin, random - VITAMIN D 25 Hydroxy        Discussed  regular exercise, BP monitoring, weight control to achieve/maintain BMI less than 25 and discussed med and SE's. Recommended labs to assess and monitor clinical status with further disposition pending results of labs. Over 30 minutes of exam, counseling, chart review was performed.

## 2016-11-28 LAB — HEMOGLOBIN A1C
HEMOGLOBIN A1C: 5.4 % (ref <5.7)
MEAN PLASMA GLUCOSE: 108 mg/dL

## 2016-11-28 LAB — INSULIN, RANDOM: INSULIN: 4.9 u[IU]/mL (ref 2.0–19.6)

## 2016-11-28 LAB — VITAMIN D 25 HYDROXY (VIT D DEFICIENCY, FRACTURES): VIT D 25 HYDROXY: 85 ng/mL (ref 30–100)

## 2016-12-05 MED FILL — BUPROPION HCL XL 300 MG TAB: 300 | 90 days supply | Qty: 90 | Fill #1

## 2016-12-06 ENCOUNTER — Other Ambulatory Visit: Payer: Self-pay | Admitting: Internal Medicine

## 2016-12-06 MED FILL — LISINOPRIL-HCTZ 20-25 MG TA: 20-25 | 90 days supply | Qty: 90 | Fill #0

## 2017-01-08 ENCOUNTER — Other Ambulatory Visit: Payer: Self-pay | Admitting: Internal Medicine

## 2017-01-08 MED FILL — ATENOLOL 100 MG TABLET: 100 | 90 days supply | Qty: 90 | Fill #0

## 2017-03-05 ENCOUNTER — Other Ambulatory Visit: Payer: Self-pay | Admitting: Internal Medicine

## 2017-03-05 ENCOUNTER — Ambulatory Visit (INDEPENDENT_AMBULATORY_CARE_PROVIDER_SITE_OTHER): Payer: 59 | Admitting: Internal Medicine

## 2017-03-05 ENCOUNTER — Encounter: Payer: Self-pay | Admitting: Internal Medicine

## 2017-03-05 VITALS — BP 122/74 | HR 76 | Temp 97.0°F | Resp 18 | Ht 63.0 in | Wt 155.8 lb

## 2017-03-05 DIAGNOSIS — E782 Mixed hyperlipidemia: Secondary | ICD-10-CM | POA: Diagnosis not present

## 2017-03-05 DIAGNOSIS — E039 Hypothyroidism, unspecified: Secondary | ICD-10-CM | POA: Diagnosis not present

## 2017-03-05 DIAGNOSIS — Z79899 Other long term (current) drug therapy: Secondary | ICD-10-CM

## 2017-03-05 DIAGNOSIS — R7303 Prediabetes: Secondary | ICD-10-CM

## 2017-03-05 DIAGNOSIS — I10 Essential (primary) hypertension: Secondary | ICD-10-CM

## 2017-03-05 DIAGNOSIS — E559 Vitamin D deficiency, unspecified: Secondary | ICD-10-CM | POA: Diagnosis not present

## 2017-03-05 MED ORDER — ALPRAZOLAM 0.5 MG PO TABS: ORAL_TABLET | ORAL | 2 refills | Status: AC

## 2017-03-05 MED FILL — LISINOPRIL-HCTZ 20-25 MG TA: 20-25 | 90 days supply | Qty: 90 | Fill #1

## 2017-03-05 MED FILL — ALPRAZolam 0.5 MG TABS: 0.5 | 30 days supply | Qty: 30 | Fill #0

## 2017-03-05 MED FILL — buPROPion HCL ER (XL) 300 M: 300 | 90 days supply | Qty: 90 | Fill #0

## 2017-03-05 NOTE — Progress Notes (Signed)
This very nice 61 y.o. DBF presents for 6 month follow up with Hypertension, Hyperlipidemia, Pre-Diabetes, Hypothyroidism and Vitamin D Deficiency.      Patient is treated for HTN since 1992 & BP has been controlled at home. Today's BP is at goal - 122/74. Patient has had no complaints of any cardiac type chest pain, palpitations, dyspnea/orthopnea/PND, dizziness, claudication, or dependent edema.     Hyperlipidemia is not controlled with diet & meds. Patient denies myalgias or other med SE's. Last Lipids were not at goal: Lab Results  Component Value Date   CHOL 190 11/27/2016   HDL 71 11/27/2016   LDLCALC 108 (H) 11/27/2016   TRIG 57 11/27/2016   CHOLHDL 2.7 11/27/2016      Also, the patient has history of PreDiabetes (A1c 5.8% in 2011) and has had no symptoms of reactive hypoglycemia, diabetic polys, paresthesias or visual blurring.  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 Further, the patient also has history of Vitamin D Deficiency ("33" in 2008)  and supplements vitamin D without any suspected side-effects. Last vitamin D was at goal:  Lab Results  Component Value Date   VD25OH 85 11/27/2016   Current Outpatient Prescriptions on File Prior to Visit  Medication Sig  . aspirin 81 MG tablet Take 81 mg by mouth daily.    Marland Kitchen atenolol (TENORMIN) 100 MG tablet TAKE 1/2 TO 1 TABLET BY MOUTH DAILY FOR BLOOD PRESSURE  . Biotin 5000 MCG TABS Take by mouth daily.  . Cholecalciferol (VITAMIN D) 2000 units CAPS take 3 caps (6,000 units) / daily  . CINNAMON PO Take 1,000 mg by mouth 2 (two) times daily.   Marland Kitchen ezetimibe (ZETIA) 10 MG tablet TAKE 1 TABLET BY MOUTH DAILY.  . fexofenadine (ALLEGRA) 180 MG tablet Take 180 mg by mouth daily. Patient OTC Allegra PRN  . fluticasone (FLONASE) 50 MCG/ACT  nasal spray PLACE 2 SPRAYS INTO BOTH NOSTRILS DAILY.  Marland Kitchen levothyroxine (SYNTHROID, LEVOTHROID) 112 MCG tablet TAKE 1 TABLET BY MOUTH ONCE DAILY FOR THYROID  . lisinopril-hydrochlorothiazide (PRINZIDE,ZESTORETIC) 20-25 MG tablet TAKE 1 TABLET BY MOUTH ONCE DAILY  . MINIVELLE 0.025 MG/24HR   . Multiple Vitamins-Minerals (HAIR/SKIN/NAILS PO) Take by mouth daily. 3 tablets daily  . Omega-3 Fatty Acids (FISH OIL) 1000 MG CAPS Take by mouth daily.  . vitamin E 400 UNIT capsule Take 400 Units by mouth daily.   No current facility-administered medications on file prior to visit.    Allergies  Allergen Reactions  . Erythromycin Nausea And Vomiting and Other (See Comments)    Diarrhea - more severe   PMHx:   Past Medical History:  Diagnosis Date  . Anxiety disorder   . Cancer ([place])    thyroid  . GERD (gastroesophageal reflux disease)   . History of thyroid cancer   . Hyperlipidemia   . Hypertension   . Migraine headache   . Papilledema    bilateral  . Pseudotumor cerebri 04/19/2013  . Thyroid disease   . Vitamin D deficiency    Immunization History  Administered Date(s) Administered  . DTaP 12/14/1994  . Hepatitis B 07/15/1989  . PPD Test 07/18/2014, 08/04/2015  . Pneumococcal Polysaccharide-23 10/09/1998  . Tdap 05/03/2015   Past Surgical History:  Procedure Laterality  Date  . ABDOMINAL HYSTERECTOMY  2005  . THYROIDECTOMY  04/25/11   FHx:    Reviewed / unchanged  SHx:    Reviewed / unchanged  Systems Review:  Constitutional: Denies fever, chills, wt changes, headaches, insomnia, fatigue, night sweats, change in appetite. Eyes: Denies redness, blurred vision, diplopia, discharge, itchy, watery eyes.  ENT: Denies discharge, congestion, post nasal drip, epistaxis, sore throat, earache, hearing loss, dental pain, tinnitus, vertigo, sinus pain, snoring.  CV: Denies chest pain, palpitations, irregular heartbeat, syncope, dyspnea, diaphoresis, orthopnea, PND, claudication or  edema. Respiratory: denies cough, dyspnea, DOE, pleurisy, hoarseness, laryngitis, wheezing.  Gastrointestinal: Denies dysphagia, odynophagia, heartburn, reflux, water brash, abdominal pain or cramps, nausea, vomiting, bloating, diarrhea, constipation, hematemesis, melena, hematochezia  or hemorrhoids. Genitourinary: Denies dysuria, frequency, urgency, nocturia, hesitancy, discharge, hematuria or flank pain. Musculoskeletal: Denies arthralgias, myalgias, stiffness, jt. swelling, pain, limping or strain/sprain.  Skin: Denies pruritus, rash, hives, warts, acne, eczema or change in skin lesion(s). Neuro: No weakness, tremor, incoordination, spasms, paresthesia or pain. Psychiatric: Denies confusion, memory loss or sensory loss. Endo: Denies change in weight, skin or hair change.  Heme/Lymph: No excessive bleeding, bruising or enlarged lymph nodes.  Physical Exam  BP 122/74   Pulse 76   Temp (!) 97 F (36.1 C)   Resp 18   Ht 5\' 3"  (1.6 m)   Wt 155 lb 12.8 oz (70.7 kg)   BMI 27.60 kg/m   Appears well nourished, well groomed  and in no distress.  Eyes: PERRLA, EOMs, conjunctiva no swelling or erythema. Sinuses: No frontal/maxillary tenderness ENT/Mouth: EAC's clear, TM's nl w/o erythema, bulging. Nares clear w/o erythema, swelling, exudates. Oropharynx clear without erythema or exudates. Oral hygiene is good. Tongue normal, non obstructing. Hearing intact.  Neck: Supple. Thyroid nl. Car 2+/2+ without bruits, nodes or JVD. Chest: Respirations nl with BS clear & equal w/o rales, rhonchi, wheezing or stridor.  Cor: Heart sounds normal w/ regular rate and rhythm without sig. murmurs, gallops, clicks or rubs. Peripheral pulses normal and equal  without edema.  Abdomen: Soft & bowel sounds normal. Non-tender w/o guarding, rebound, hernias, masses or organomegaly.  Lymphatics: Unremarkable.  Musculoskeletal: Full ROM all peripheral extremities, joint stability, 5/5 strength and normal gait.   Skin: Warm, dry without exposed rashes, lesions or ecchymosis apparent.  Neuro: Cranial nerves intact, reflexes equal bilaterally. Sensory-motor testing grossly intact. Tendon reflexes grossly intact.  Pysch: Alert & oriented x 3.  Insight and judgement nl & appropriate. No ideations.  Assessment and Plan:  1. Essential hypertension  - Continue medication, monitor blood pressure at home.  - Continue DASH diet. Reminder to go to the ER if any CP,  SOB, nausea, dizziness, severe HA, changes vision/speech.  - CBC with Differential/Platelet - BASIC METABOLIC PANEL WITH GFR - Magnesium - TSH  2. Hyperlipidemia, mixed  - Continue diet/meds, exercise,& lifestyle modifications.  - Continue monitor periodic cholesterol/liver & renal functions   - Hepatic function panel - Lipid panel - TSH  3. Prediabetes  - Continue diet, exercise, lifestyle modifications.  - Monitor appropriate labs.  - Hemoglobin A1c - Insulin, fasting  4. Vitamin D deficiency  - Continue supplementation.  - VITAMIN D 25 Hydroxy  5. Hypothyroidism   6. Medication management   - CBC with Differential/Platelet - BASIC METABOLIC PANEL WITH GFR - Hepatic function panel - Magnesium - Lipid panel - TSH - Hemoglobin A1c - Insulin, fasting - VITAMIN D 25 Hydroxy       Discussed  regular exercise,  BP monitoring, weight control to achieve/maintain BMI less than 25 and discussed med and SE's. Recommended labs to assess and monitor clinical status with further disposition pending results of labs. Over 30 minutes of exam, counseling, chart review was performed.

## 2017-03-05 NOTE — Patient Instructions (Signed)

## 2017-03-06 ENCOUNTER — Other Ambulatory Visit: Payer: Self-pay | Admitting: Internal Medicine

## 2017-03-06 DIAGNOSIS — I10 Essential (primary) hypertension: Secondary | ICD-10-CM

## 2017-03-06 DIAGNOSIS — N289 Disorder of kidney and ureter, unspecified: Secondary | ICD-10-CM

## 2017-03-06 LAB — BASIC METABOLIC PANEL WITH GFR
BUN/Creatinine Ratio: 12 (calc) (ref 6–22)
BUN: 15 mg/dL (ref 7–25)
CALCIUM: 9.7 mg/dL (ref 8.6–10.4)
CHLORIDE: 102 mmol/L (ref 98–110)
CO2: 33 mmol/L — AB (ref 20–32)
Creat: 1.23 mg/dL — ABNORMAL HIGH (ref 0.50–0.99)
GFR, EST AFRICAN AMERICAN: 55 mL/min/{1.73_m2} — AB (ref >=60)
GFR, Est Non African American: 48 mL/min/{1.73_m2} — ABNORMAL LOW (ref >=60)
GLUCOSE: 93 mg/dL (ref 65–99)
POTASSIUM: 4.4 mmol/L (ref 3.5–5.3)
Sodium: 143 mmol/L (ref 135–146)

## 2017-03-06 LAB — LIPID PANEL
CHOLESTEROL: 205 mg/dL — AB (ref <200)
HDL: 74 mg/dL (ref >50)
LDL Cholesterol (Calc): 107 mg/dL (calc) — ABNORMAL HIGH
Non-HDL Cholesterol (Calc): 131 mg/dL (calc) — ABNORMAL HIGH (ref <130)
TRIGLYCERIDES: 128 mg/dL (ref <150)
Total CHOL/HDL Ratio: 2.8 (calc) (ref <5.0)

## 2017-03-06 LAB — CBC WITH DIFFERENTIAL/PLATELET
BASOS ABS: 18 {cells}/uL (ref 0–200)
BASOS PCT: 0.4 %
EOS ABS: 51 {cells}/uL (ref 15–500)
Eosinophils Relative: 1.1 %
HCT: 42.4 % (ref 35.0–45.0)
Hemoglobin: 14.1 g/dL (ref 11.7–15.5)
Lymphs Abs: 1260 cells/uL (ref 850–3900)
MCH: 31.5 pg (ref 27.0–33.0)
MCHC: 33.3 g/dL (ref 32.0–36.0)
MCV: 94.9 fL (ref 80.0–100.0)
MONOS PCT: 7.1 %
MPV: 11.8 fL (ref 7.5–12.5)
NEUTROS PCT: 64 %
Neutro Abs: 2944 cells/uL (ref 1500–7800)
PLATELETS: 284 10*3/uL (ref 140–400)
RBC: 4.47 10*6/uL (ref 3.80–5.10)
RDW: 12.7 % (ref 11.0–15.0)
TOTAL LYMPHOCYTE: 27.4 %
WBC mixed population: 327 cells/uL (ref 200–950)
WBC: 4.6 10*3/uL (ref 3.8–10.8)

## 2017-03-06 LAB — MAGNESIUM: Magnesium: 2.3 mg/dL (ref 1.5–2.5)

## 2017-03-06 LAB — HEPATIC FUNCTION PANEL
AG Ratio: 1.5 (calc) (ref 1.0–2.5)
ALBUMIN MSPROF: 4.3 g/dL (ref 3.6–5.1)
ALT: 19 U/L (ref 6–29)
AST: 17 U/L (ref 10–35)
Alkaline phosphatase (APISO): 60 U/L (ref 33–130)
BILIRUBIN DIRECT: 0.1 mg/dL (ref 0.0–0.2)
Globulin: 2.8 g/dL (calc) (ref 1.9–3.7)
Indirect Bilirubin: 0.4 mg/dL (calc) (ref 0.2–1.2)
TOTAL PROTEIN: 7.1 g/dL (ref 6.1–8.1)
Total Bilirubin: 0.5 mg/dL (ref 0.2–1.2)

## 2017-03-06 LAB — HEMOGLOBIN A1C
Hgb A1c MFr Bld: 5.6 % of total Hgb (ref <5.7)
MEAN PLASMA GLUCOSE: 114 (calc)
eAG (mmol/L): 6.3 (calc)

## 2017-03-06 LAB — TSH: TSH: 1.05 m[IU]/L (ref 0.40–4.50)

## 2017-03-06 LAB — VITAMIN D 25 HYDROXY (VIT D DEFICIENCY, FRACTURES): Vit D, 25-Hydroxy: 79 ng/mL (ref 30–100)

## 2017-03-06 MED ORDER — LISINOPRIL 20 MG PO TABS: ORAL_TABLET | ORAL | 3 refills | Status: DC

## 2017-03-07 LAB — INSULIN, FASTING: Insulin: 12.9 u[IU]/mL (ref 2.0–19.6)

## 2017-03-07 MED FILL — LISINOPRIL 20 MG TAB: 20 | 90 days supply | Qty: 90 | Fill #0

## 2017-03-21 ENCOUNTER — Ambulatory Visit (INDEPENDENT_AMBULATORY_CARE_PROVIDER_SITE_OTHER): Payer: 59 | Admitting: Internal Medicine

## 2017-03-21 ENCOUNTER — Encounter: Payer: Self-pay | Admitting: Internal Medicine

## 2017-03-21 VITALS — BP 126/82 | HR 64 | Temp 97.3°F | Resp 16 | Ht 63.0 in | Wt 161.0 lb

## 2017-03-21 DIAGNOSIS — J029 Acute pharyngitis, unspecified: Secondary | ICD-10-CM | POA: Diagnosis not present

## 2017-03-21 DIAGNOSIS — J0100 Acute maxillary sinusitis, unspecified: Secondary | ICD-10-CM

## 2017-03-21 MED ORDER — AZITHROMYCIN 250 MG PO TABS: ORAL_TABLET | ORAL | 1 refills | Status: DC

## 2017-03-21 MED ORDER — PREDNISONE 20 MG PO TABS: ORAL_TABLET | ORAL | 0 refills | Status: DC

## 2017-03-21 MED FILL — predniSONE 20 MG TABS: 20 | 7 days supply | Qty: 13 | Fill #0

## 2017-03-21 MED FILL — AZITHROMYCIN 250 MG TAB: 250 | 5 days supply | Qty: 6 | Fill #0

## 2017-03-21 NOTE — Progress Notes (Signed)
126/82 

## 2017-03-21 NOTE — Progress Notes (Signed)
  Subjective:    Patient ID: Tammy Duran, female    DOB: 1956/05/31, 61 y.o.   MRN: [id number]  HPI   Patient presents with 2-3 day hx/o increasing discomfort / pain in her Lt throat. Neck associated w/swallowing. Has had some sinus pressure and congestion in the Lt cheek.Denies fevers, chills, sweats, rashes or dyspnea     Medication Sig  . ALPRAZolam  0.5 MG tablet Take 1/2 to 1 tablet at bedtime as needed  . aspirin 81 MG tablet Take 81 mg by mouth daily.    Marland Kitchen atenolol100 MG tablet TAKE 1/2 TO 1 TAB DAILY  . Biotin 5000 MCG TABS Take by mouth daily.  Marland Kitchen buPROPion-XL 300 MG  TAKE 1 TABLET BY MOUTH ONCE DAILY  . VITAMIN D 2000 units  take 3 caps (6,000 units) / daily  . CINNAMON PO Take 1,000 mg by mouth 2 (two) times daily.   Marland Kitchen ezetimibe  10 MG tablet TAKE 1 TABLET BY MOUTH DAILY.  . fexofenadine180 MG tablet Take 180 mg by mouth daily. Patient OTC Allegra PRN  . FLONASE  nasal spray PLACE 2 SPRAYS INTO BOTH NOSTRILS DAILY.  Marland Kitchen levothyroxine  112 MCG tablet TAKE 1 TABLET BY MOUTH ONCE DAILY FOR THYROID  . lisinopril  20 MG tablet Take 1 tablet daily for BP  . MINIVELLE 0.025 MG/24HR   . HAIR/SKIN/NAILS  Take by mouth daily. 3 tablets daily  . Omega-3 FISH OIL 1000 MG  Take by mouth daily.  . vitamin E 400 UNIT  Take 400 Units by mouth daily.           Review of Systems  10 point systems review negative except as above.    Objective:   Physical Exam  BP 126/82   Pulse 64   Temp (!) 97.3 F (36.3 C)   Resp 16   Ht 5\' 3"  (1.6 m)   Wt 161 lb (73 kg)   BMI 28.52 kg/m   HEENT - Eac's patent. TM's Nl. EOM's full. (+)Left Maxillary Tendertness. NasoOroPharynx clear 1(+) injected w/o exudates. Neck - supple. Nl Thyroid. Carotids 2+ & No bruits, nodes, JVD. Sl tender Left anterior cx chain.  Chest - Clear equal BS w/o. Neuro - No obvious Cr N abnormalities. Nl w/o focal abnormalities.    Assessment & Plan:   1. Pharyngitis   2. Acute maxillary sinusitis, recurrence not  specified  - predniSONE (DELTASONE) 20 MG tablet; 1 tab 3 x day for 2 days, then 1 tab 2 x day for 2 days, then 1 tab 1 x day for 3 days  Dispense: 13 tablet; Refill: 0  - azithromycin (ZITHROMAX) 250 MG tablet; Take 2 tablets (500 mg) on  Day 1,  followed by 1 tablet (250 mg) once daily on Days 2 through 5.  Dispense: 6 each; Refill: 1  - discussed meds & SE's.

## 2017-03-26 MED FILL — LEVOTHYROXINE 112 MCG TAB: 112 | 90 days supply | Qty: 90 | Fill #1

## 2017-04-01 MED FILL — MINIVELLE 0.025 MG PATCH: 0.025 | 28 days supply | Qty: 8 | Fill #0

## 2017-04-04 MED FILL — EZETIMIBE 10 MG TABLET: 10 | 90 days supply | Qty: 90 | Fill #1

## 2017-04-14 ENCOUNTER — Other Ambulatory Visit: Payer: 59

## 2017-04-14 DIAGNOSIS — N289 Disorder of kidney and ureter, unspecified: Secondary | ICD-10-CM | POA: Diagnosis not present

## 2017-04-14 DIAGNOSIS — I10 Essential (primary) hypertension: Secondary | ICD-10-CM | POA: Diagnosis not present

## 2017-04-14 LAB — BASIC METABOLIC PANEL WITH GFR
BUN/Creatinine Ratio: 10 (calc) (ref 6–22)
BUN: 10 mg/dL (ref 7–25)
CALCIUM: 8.8 mg/dL (ref 8.6–10.4)
CHLORIDE: 101 mmol/L (ref 98–110)
CO2: 31 mmol/L (ref 20–32)
Creat: 1.03 mg/dL — ABNORMAL HIGH (ref 0.50–0.99)
GFR, EST AFRICAN AMERICAN: 68 mL/min/{1.73_m2} (ref >=60)
GFR, Est Non African American: 59 mL/min/{1.73_m2} — ABNORMAL LOW (ref >=60)
GLUCOSE: 92 mg/dL (ref 65–99)
POTASSIUM: 3.8 mmol/L (ref 3.5–5.3)
Sodium: 138 mmol/L (ref 135–146)

## 2017-05-05 ENCOUNTER — Other Ambulatory Visit: Payer: Self-pay | Admitting: Internal Medicine

## 2017-05-05 DIAGNOSIS — I10 Essential (primary) hypertension: Secondary | ICD-10-CM

## 2017-05-07 MED FILL — LISINOPRIL-HCTZ 20-12.5 MG: 20-12.5 | 90 days supply | Qty: 90 | Fill #0

## 2017-06-01 LAB — HM MAMMOGRAPHY: HM Mammogram: NORMAL (ref 0–4)

## 2017-06-04 DIAGNOSIS — Z79890 Hormone replacement therapy (postmenopausal): Secondary | ICD-10-CM | POA: Diagnosis not present

## 2017-06-04 DIAGNOSIS — Z1231 Encounter for screening mammogram for malignant neoplasm of breast: Secondary | ICD-10-CM | POA: Diagnosis not present

## 2017-06-04 DIAGNOSIS — Z130 Encounter for screening for diseases of the blood and blood-forming organs and certain disorders involving the immune mechanism: Secondary | ICD-10-CM | POA: Diagnosis not present

## 2017-06-04 DIAGNOSIS — Z780 Asymptomatic menopausal state: Secondary | ICD-10-CM | POA: Diagnosis not present

## 2017-06-04 DIAGNOSIS — Z01419 Encounter for gynecological examination (general) (routine) without abnormal findings: Secondary | ICD-10-CM | POA: Diagnosis not present

## 2017-06-04 DIAGNOSIS — Z1389 Encounter for screening for other disorder: Secondary | ICD-10-CM | POA: Diagnosis not present

## 2017-06-04 MED FILL — ESTRADIOL 0.025 MG/24HR PTT: 0.025 | 84 days supply | Qty: 24 | Fill #0

## 2017-06-09 MED FILL — BUPROPION HCL XL 300 MG TAB: 300 | 90 days supply | Qty: 90 | Fill #1

## 2017-06-12 NOTE — Progress Notes (Signed)
FOLLOW UP  Assessment and Plan:   Hypertension Well controlled with current medications  Monitor blood pressure at home; patient to call if consistently greater than 130/80 Continue DASH diet - emphasized low sodium and increasing fluid intake  Reminder to go to the ER if any CP, SOB, nausea, dizziness, severe HA, changes vision/speech, left arm numbness and tingling and jaw pain.  Cholesterol Currently on zetia 10 mg; consider switching to statin if continues to trend up Continue low cholesterol diet and exercise; discussed increasing soluble fiber or adding supplement Check lipid panel.   History of Prediabetes Well controlled by lifestyle changes  Continue diet and exercise.  Check BMP; defer A1C due to excellent control over last several visits  Hypothyroidism continue medications the same reminded to take on an empty stomach 30-34mins before food.  check TSH level  Vitamin D Def/ osteoporosis prevention Continue supplementation Has been stable at goal at last few visits; defer vit D level  Continue diet and meds as discussed. Further disposition pending results of labs. Discussed med's effects and SE's.   Over 30 minutes of exam, counseling, chart review, and critical decision making was performed.   Future Appointments  Date Time Provider [place]  09/18/2017  9:00 AM Unk Pinto, MD [facility] None    ----------------------------------------------------------------------------------------------------------------------  HPI 61 y.o. female  presents for 3 month follow up on hypertension, cholesterol, history of prediabetes, post surgical hypothyroid and vitamin D deficiency.   BMI is Body mass index is 28.66 kg/m., she has been working on diet and exercise. She reports she typically gets in 8000-10000 steps daily.  Wt Readings from Last 3 Encounters:  06/13/17 161 lb 12.8 oz (73.4 kg)  03/21/17 161 lb (73 kg)  03/05/17 155 lb 12.8 oz (70.7 kg)   Her  blood pressure has been controlled at home, today their BP is BP: 132/80  She does not workout. She denies chest pain, shortness of breath, dizziness.   She is on cholesterol medication and denies myalgias. Her cholesterol is not at goal. The cholesterol last visit was:   Lab Results  Component Value Date   CHOL 205 (H) 03/05/2017   HDL 74 03/05/2017   LDLCALC 108 (H) 11/27/2016   TRIG 128 03/05/2017   CHOLHDL 2.8 03/05/2017    She has been working on diet and exercise for history of prediabetes, and denies increased appetite, nausea, paresthesia of the feet, polydipsia, polyuria, visual disturbances and vomiting. Last A1C in the office was:  Lab Results  Component Value Date   HGBA1C 5.6 03/05/2017   She is on thyroid medication. Her medication was not changed last visit.   Lab Results  Component Value Date   TSH 1.05 03/05/2017  . Patient is on Vitamin D supplement and at goal:    Lab Results  Component Value Date   VD25OH 79 03/05/2017      Current Medications:  Current Outpatient Medications on File Prior to Visit  Medication Sig  . aspirin 81 MG tablet Take 81 mg by mouth daily.    Marland Kitchen atenolol (TENORMIN) 100 MG tablet TAKE 1/2 TO 1 TABLET BY MOUTH DAILY FOR BLOOD PRESSURE  . Biotin 5000 MCG TABS Take by mouth daily.  Marland Kitchen buPROPion (WELLBUTRIN XL) 300 MG 24 hr tablet TAKE 1 TABLET BY MOUTH ONCE DAILY  . Cholecalciferol (VITAMIN D) 2000 units CAPS take 3 caps (6,000 units) / daily  . CINNAMON PO Take 1,000 mg by mouth 2 (two) times daily.   Marland Kitchen ezetimibe (  ZETIA) 10 MG tablet TAKE 1 TABLET BY MOUTH DAILY.  . fexofenadine (ALLEGRA) 180 MG tablet Take 180 mg by mouth daily. Patient OTC Allegra PRN  . fluticasone (FLONASE) 50 MCG/ACT nasal spray PLACE 2 SPRAYS INTO BOTH NOSTRILS DAILY.  Marland Kitchen levothyroxine (SYNTHROID, LEVOTHROID) 112 MCG tablet TAKE 1 TABLET BY MOUTH ONCE DAILY FOR THYROID  . lisinopril-hydrochlorothiazide (PRINZIDE,ZESTORETIC) 20-12.5 MG tablet TAKE 1 TABLET BY MOUTH  ONCE DAILY FOR BLOOD PRESSURE  . MINIVELLE 0.025 MG/24HR   . Multiple Vitamins-Minerals (HAIR/SKIN/NAILS PO) Take by mouth daily. 3 tablets daily  . Omega-3 Fatty Acids (FISH OIL) 1000 MG CAPS Take by mouth daily.  . vitamin E 400 UNIT capsule Take 400 Units by mouth daily.  Marland Kitchen azithromycin (ZITHROMAX) 250 MG tablet Take 2 tablets (500 mg) on  Day 1,  followed by 1 tablet (250 mg) once daily on Days 2 through 5.  . lisinopril (PRINIVIL,ZESTRIL) 20 MG tablet Take 1 tablet daily for BP (Patient not taking: Reported on 06/13/2017)  . predniSONE (DELTASONE) 20 MG tablet 1 tab 3 x day for 2 days, then 1 tab 2 x day for 2 days, then 1 tab 1 x day for 3 days   No current facility-administered medications on file prior to visit.      Allergies:  Allergies  Allergen Reactions  . Erythromycin Nausea And Vomiting and Other (See Comments)    Diarrhea - more severe     Medical History:  Past Medical History:  Diagnosis Date  . Anxiety disorder   . Cancer ([place])    thyroid  . GERD (gastroesophageal reflux disease)   . History of thyroid cancer   . Hyperlipidemia   . Hypertension   . Migraine headache   . Papilledema    bilateral  . Pseudotumor cerebri 04/19/2013  . Thyroid disease   . Vitamin D deficiency    Family history- Reviewed and unchanged Social history- Reviewed and unchanged   Review of Systems:  Review of Systems  Constitutional: Negative for malaise/fatigue and weight loss.  HENT: Negative for hearing loss and tinnitus.   Eyes: Negative for blurred vision and double vision.  Respiratory: Negative for cough, shortness of breath and wheezing.   Cardiovascular: Negative for chest pain, palpitations, orthopnea, claudication and leg swelling.  Gastrointestinal: Negative for abdominal pain, blood in stool, constipation, diarrhea, heartburn, melena, nausea and vomiting.  Genitourinary: Negative.   Musculoskeletal: Negative for joint pain and myalgias.  Skin: Negative for rash.   Neurological: Negative for dizziness, tingling, sensory change, weakness and headaches.  Endo/Heme/Allergies: Negative for polydipsia.  Psychiatric/Behavioral: Negative.   All other systems reviewed and are negative.     Physical Exam: BP 132/80   Pulse 62   Temp 97.7 F (36.5 C)   Ht 5\' 3"  (1.6 m)   Wt 161 lb 12.8 oz (73.4 kg)   SpO2 98%   BMI 28.66 kg/m  Wt Readings from Last 3 Encounters:  06/13/17 161 lb 12.8 oz (73.4 kg)  03/21/17 161 lb (73 kg)  03/05/17 155 lb 12.8 oz (70.7 kg)   General Appearance: Well nourished, in no apparent distress. Eyes: PERRLA, EOMs, conjunctiva no swelling or erythema Sinuses: No Frontal/maxillary tenderness ENT/Mouth: Ext aud canals clear, TMs without erythema, bulging. No erythema, swelling, or exudate on post pharynx.  Tonsils not swollen or erythematous. Hearing normal.  Neck: Supple, thyroid normal.  Respiratory: Respiratory effort normal, BS equal bilaterally without rales, rhonchi, wheezing or stridor.  Cardio: RRR with no MRGs. Brisk peripheral pulses  without edema.  Abdomen: Soft, + BS.  Non tender, no guarding, rebound, hernias, masses. Lymphatics: Non tender without lymphadenopathy.  Musculoskeletal: Full ROM, 5/5 strength, Normal gait Skin: Warm, dry without rashes, lesions, ecchymosis.  Neuro: Cranial nerves intact. No cerebellar symptoms.  Psych: Awake and oriented X 3, normal affect, Insight and Judgment appropriate.    Izora Ribas, NP 10:17 AM [facility]

## 2017-06-13 ENCOUNTER — Other Ambulatory Visit: Payer: Self-pay | Admitting: Adult Health

## 2017-06-13 ENCOUNTER — Encounter: Payer: Self-pay | Admitting: Adult Health

## 2017-06-13 ENCOUNTER — Ambulatory Visit (INDEPENDENT_AMBULATORY_CARE_PROVIDER_SITE_OTHER): Payer: 59 | Admitting: Adult Health

## 2017-06-13 VITALS — BP 132/80 | HR 62 | Temp 97.7°F | Ht 63.0 in | Wt 161.8 lb

## 2017-06-13 DIAGNOSIS — I10 Essential (primary) hypertension: Secondary | ICD-10-CM | POA: Diagnosis not present

## 2017-06-13 DIAGNOSIS — E782 Mixed hyperlipidemia: Secondary | ICD-10-CM | POA: Diagnosis not present

## 2017-06-13 DIAGNOSIS — E559 Vitamin D deficiency, unspecified: Secondary | ICD-10-CM | POA: Diagnosis not present

## 2017-06-13 DIAGNOSIS — Z87898 Personal history of other specified conditions: Secondary | ICD-10-CM | POA: Diagnosis not present

## 2017-06-13 DIAGNOSIS — Z79899 Other long term (current) drug therapy: Secondary | ICD-10-CM | POA: Diagnosis not present

## 2017-06-13 DIAGNOSIS — Z1211 Encounter for screening for malignant neoplasm of colon: Secondary | ICD-10-CM

## 2017-06-13 DIAGNOSIS — E890 Postprocedural hypothyroidism: Secondary | ICD-10-CM | POA: Diagnosis not present

## 2017-06-13 LAB — CBC WITH DIFFERENTIAL/PLATELET
BASOS PCT: 0.6 %
Basophils Absolute: 28 cells/uL (ref 0–200)
EOS ABS: 132 {cells}/uL (ref 15–500)
EOS PCT: 2.8 %
HCT: 40.2 % (ref 35.0–45.0)
HEMOGLOBIN: 13.6 g/dL (ref 11.7–15.5)
Lymphs Abs: 1471 cells/uL (ref 850–3900)
MCH: 32.2 pg (ref 27.0–33.0)
MCHC: 33.8 g/dL (ref 32.0–36.0)
MCV: 95 fL (ref 80.0–100.0)
MPV: 12 fL (ref 7.5–12.5)
Monocytes Relative: 7.3 %
NEUTROS ABS: 2726 {cells}/uL (ref 1500–7800)
Neutrophils Relative %: 58 %
Platelets: 255 10*3/uL (ref 140–400)
RBC: 4.23 10*6/uL (ref 3.80–5.10)
RDW: 12.3 % (ref 11.0–15.0)
Total Lymphocyte: 31.3 %
WBC mixed population: 343 cells/uL (ref 200–950)
WBC: 4.7 10*3/uL (ref 3.8–10.8)

## 2017-06-13 LAB — HEPATIC FUNCTION PANEL
AG RATIO: 1.5 (calc) (ref 1.0–2.5)
ALKALINE PHOSPHATASE (APISO): 60 U/L (ref 33–130)
ALT: 19 U/L (ref 6–29)
AST: 16 U/L (ref 10–35)
Albumin: 4.1 g/dL (ref 3.6–5.1)
BILIRUBIN DIRECT: 0.1 mg/dL (ref 0.0–0.2)
BILIRUBIN TOTAL: 0.3 mg/dL (ref 0.2–1.2)
Globulin: 2.7 g/dL (calc) (ref 1.9–3.7)
Indirect Bilirubin: 0.2 mg/dL (calc) (ref 0.2–1.2)
Total Protein: 6.8 g/dL (ref 6.1–8.1)

## 2017-06-13 LAB — BASIC METABOLIC PANEL WITH GFR
BUN: 9 mg/dL (ref 7–25)
CO2: 30 mmol/L (ref 20–32)
CREATININE: 0.97 mg/dL (ref 0.50–0.99)
Calcium: 9.7 mg/dL (ref 8.6–10.4)
Chloride: 102 mmol/L (ref 98–110)
GFR, EST AFRICAN AMERICAN: 74 mL/min/{1.73_m2} (ref >=60)
GFR, EST NON AFRICAN AMERICAN: 63 mL/min/{1.73_m2} (ref >=60)
Glucose, Bld: 94 mg/dL (ref 65–99)
Potassium: 4.5 mmol/L (ref 3.5–5.3)
SODIUM: 141 mmol/L (ref 135–146)

## 2017-06-13 LAB — LIPID PANEL
Cholesterol: 203 mg/dL — ABNORMAL HIGH (ref <200)
HDL: 79 mg/dL (ref >50)
LDL CHOLESTEROL (CALC): 108 mg/dL — AB
NON-HDL CHOLESTEROL (CALC): 124 mg/dL (ref <130)
TRIGLYCERIDES: 75 mg/dL (ref <150)
Total CHOL/HDL Ratio: 2.6 (calc) (ref <5.0)

## 2017-06-13 LAB — TSH: TSH: 1.31 m[IU]/L (ref 0.40–4.50)

## 2017-06-13 NOTE — Patient Instructions (Signed)
Make sure you get minimum 80-100 fluid ounces of water or unsweetened beverages daily (coffee doesn't count!)  Continue wearing support hose -   Use lasix as needed for lower extremity swelling   Low-Sodium Eating Plan Sodium, which is an element that makes up salt, helps you maintain a healthy balance of fluids in your body. Too much sodium can increase your blood pressure and cause fluid and waste to be held in your body. Your health care provider or dietitian may recommend following this plan if you have high blood pressure (hypertension), kidney disease, liver disease, or heart failure. Eating less sodium can help lower your blood pressure, reduce swelling, and protect your heart, liver, and kidneys. What are tips for following this plan? General guidelines  Most people on this plan should limit their sodium intake to 1,500-2,000 mg (milligrams) of sodium each day. Reading food labels  The Nutrition Facts label lists the amount of sodium in one serving of the food. If you eat more than one serving, you must multiply the listed amount of sodium by the number of servings.  Choose foods with less than 140 mg of sodium per serving.  Avoid foods with 300 mg of sodium or more per serving. Shopping  Look for lower-sodium products, often labeled as "low-sodium" or "no salt added."  Always check the sodium content even if foods are labeled as "unsalted" or "no salt added".  Buy fresh foods. ? Avoid canned foods and premade or frozen meals. ? Avoid canned, cured, or processed meats  Buy breads that have less than 80 mg of sodium per slice. Cooking  Eat more home-cooked food and less restaurant, buffet, and fast food.  Avoid adding salt when cooking. Use salt-free seasonings or herbs instead of table salt or sea salt. Check with your health care provider or pharmacist before using salt substitutes.  Cook with plant-based oils, such as canola, sunflower, or olive oil. Meal  planning  When eating at a restaurant, ask that your food be prepared with less salt or no salt, if possible.  Avoid foods that contain MSG (monosodium glutamate). MSG is sometimes added to [place] food, bouillon, and some canned foods. What foods are recommended? The items listed may not be a complete list. Talk with your dietitian about what dietary choices are best for you. Grains Low-sodium cereals, including oats, puffed wheat and rice, and shredded wheat. Low-sodium crackers. Unsalted rice. Unsalted pasta. Low-sodium bread. Whole-grain breads and whole-grain pasta. Vegetables Fresh or frozen vegetables. "No salt added" canned vegetables. "No salt added" tomato sauce and paste. Low-sodium or reduced-sodium tomato and vegetable juice. Fruits Fresh, frozen, or canned fruit. Fruit juice. Meats and other protein foods Fresh or frozen (no salt added) meat, poultry, seafood, and fish. Low-sodium canned tuna and salmon. Unsalted nuts. Dried peas, beans, and lentils without added salt. Unsalted canned beans. Eggs. Unsalted nut butters. Dairy Milk. Soy milk. Cheese that is naturally low in sodium, such as ricotta cheese, fresh mozzarella, or Swiss cheese Low-sodium or reduced-sodium cheese. Cream cheese. Yogurt. Fats and oils Unsalted butter. Unsalted margarine with no trans fat. Vegetable oils such as canola or olive oils. Seasonings and other foods Fresh and dried herbs and spices. Salt-free seasonings. Low-sodium mustard and ketchup. Sodium-free salad dressing. Sodium-free light mayonnaise. Fresh or refrigerated horseradish. Lemon juice. Vinegar. Homemade, reduced-sodium, or low-sodium soups. Unsalted popcorn and pretzels. Low-salt or salt-free chips. What foods are not recommended? The items listed may not be a complete list. Talk with your dietitian about  what dietary choices are best for you. Grains Instant hot cereals. Bread stuffing, pancake, and biscuit mixes. Croutons. Seasoned rice or  pasta mixes. Noodle soup cups. Boxed or frozen macaroni and cheese. Regular salted crackers. Self-rising flour. Vegetables Sauerkraut, pickled vegetables, and relishes. Olives. [place] fries. Onion rings. Regular canned vegetables (not low-sodium or reduced-sodium). Regular canned tomato sauce and paste (not low-sodium or reduced-sodium). Regular tomato and vegetable juice (not low-sodium or reduced-sodium). Frozen vegetables in sauces. Meats and other protein foods Meat or fish that is salted, canned, smoked, spiced, or pickled. Bacon, ham, sausage, hotdogs, corned beef, chipped beef, packaged lunch meats, salt pork, jerky, pickled herring, anchovies, regular canned tuna, sardines, salted nuts. Dairy Processed cheese and cheese spreads. Cheese curds. Blue cheese. Feta cheese. String cheese. Regular cottage cheese. Buttermilk. Canned milk. Fats and oils Salted butter. Regular margarine. Ghee. Bacon fat. Seasonings and other foods Onion salt, garlic salt, seasoned salt, table salt, and sea salt. Canned and packaged gravies. Worcestershire sauce. Tartar sauce. Barbecue sauce. Teriyaki sauce. Soy sauce, including reduced-sodium. Steak sauce. Fish sauce. Oyster sauce. Cocktail sauce. Horseradish that you find on the shelf. Regular ketchup and mustard. Meat flavorings and tenderizers. Bouillon cubes. Hot sauce and Tabasco sauce. Premade or packaged marinades. Premade or packaged taco seasonings. Relishes. Regular salad dressings. Salsa. Potato and tortilla chips. Corn chips and puffs. Salted popcorn and pretzels. Canned or dried soups. Pizza. Frozen entrees and pot pies. Summary  Eating less sodium can help lower your blood pressure, reduce swelling, and protect your heart, liver, and kidneys.  Most people on this plan should limit their sodium intake to 1,500-2,000 mg (milligrams) of sodium each day.  Canned, boxed, and frozen foods are high in sodium. Restaurant foods, fast foods, and pizza are also  very high in sodium. You also get sodium by adding salt to food.  Try to cook at home, eat more fresh fruits and vegetables, and eat less fast food, canned, processed, or prepared foods. This information is not intended to replace advice given to you by your health care provider. Make sure you discuss any questions you have with your health care provider. Document Released: 12/21/2001 Document Revised: 06/24/2016 Document Reviewed: 06/24/2016 Elsevier Interactive Patient Education  2017 [employer].

## 2017-06-20 ENCOUNTER — Encounter: Payer: Self-pay | Admitting: Internal Medicine

## 2017-07-17 DIAGNOSIS — Z1211 Encounter for screening for malignant neoplasm of colon: Secondary | ICD-10-CM | POA: Diagnosis not present

## 2017-07-17 DIAGNOSIS — K5901 Slow transit constipation: Secondary | ICD-10-CM | POA: Diagnosis not present

## 2017-07-25 MED FILL — FLUTICASONE PROP 50 MCG SPR: 50 | 90 days supply | Qty: 48 | Fill #1

## 2017-08-01 MED FILL — LEVOTHYROXINE 112 MCG TAB: 112 | 90 days supply | Qty: 90 | Fill #2

## 2017-08-04 MED FILL — ATENOLOL 100 MG TABLET: 100 | 90 days supply | Qty: 90 | Fill #1

## 2017-08-08 MED FILL — LISINOPRIL-HCTZ 20-12.5 MG: 20-12.5 | 90 days supply | Qty: 90 | Fill #1

## 2017-08-08 MED FILL — PEG-3350 AND ELECTROLYTES S: 236 | 1 days supply | Qty: 4000 | Fill #0

## 2017-08-11 DIAGNOSIS — Z1211 Encounter for screening for malignant neoplasm of colon: Secondary | ICD-10-CM | POA: Diagnosis not present

## 2017-08-11 DIAGNOSIS — K621 Rectal polyp: Secondary | ICD-10-CM | POA: Diagnosis not present

## 2017-08-11 DIAGNOSIS — D128 Benign neoplasm of rectum: Secondary | ICD-10-CM | POA: Diagnosis not present

## 2017-08-11 DIAGNOSIS — K5730 Diverticulosis of large intestine without perforation or abscess without bleeding: Secondary | ICD-10-CM | POA: Diagnosis not present

## 2017-08-11 LAB — HM COLONOSCOPY

## 2017-08-12 ENCOUNTER — Encounter: Payer: Self-pay | Admitting: *Deleted

## 2017-08-12 ENCOUNTER — Ambulatory Visit (INDEPENDENT_AMBULATORY_CARE_PROVIDER_SITE_OTHER): Payer: 59 | Admitting: Physician Assistant

## 2017-08-12 ENCOUNTER — Encounter: Payer: Self-pay | Admitting: Physician Assistant

## 2017-08-12 VITALS — BP 130/80 | HR 78 | Temp 97.5°F | Resp 16 | Ht 63.0 in | Wt 159.2 lb

## 2017-08-12 DIAGNOSIS — J0100 Acute maxillary sinusitis, unspecified: Secondary | ICD-10-CM

## 2017-08-12 MED ORDER — PROMETHAZINE-DM 6.25-15 MG/5ML PO SYRP: 5.0000 mL | ORAL_SOLUTION | Freq: Four times a day (QID) | ORAL | 1 refills | Status: DC | PRN

## 2017-08-12 MED ORDER — AZITHROMYCIN 250 MG PO TABS: ORAL_TABLET | ORAL | 1 refills | Status: AC

## 2017-08-12 MED ORDER — PREDNISONE 20 MG PO TABS: ORAL_TABLET | ORAL | 0 refills | Status: DC

## 2017-08-12 MED FILL — PROMETHAZINE-DM SYRUP: 6.25-15 | 12 days supply | Qty: 240 | Fill #0

## 2017-08-12 MED FILL — AZITHROMYCIN 250 MG TAB: 250 | 5 days supply | Qty: 6 | Fill #0

## 2017-08-12 MED FILL — predniSONE 20 MG TABS: 20 | 7 days supply | Qty: 10 | Fill #0

## 2017-08-12 NOTE — Progress Notes (Signed)
Subjective:    Patient ID: Tammy Duran, female    DOB: 12/22/1955, 62 y.o.   MRN: [id number]  HPI 61 y.o. smoking AAF presents with cough x 10 days, not getting better. She has sinus pain, worse on the left, teeth pain, cough non productive, ear fullness. Chills but no fever, she has been on robitussin and dayquil, sudafed. Not getting better.   Blood pressure 130/80, pulse 78, temperature (!) 97.5 F (36.4 C), resp. rate 16, height 5\' 3"  (1.6 m), weight 159 lb 3.2 oz (72.2 kg), SpO2 99 %.  Medications Current Outpatient Medications on File Prior to Visit  Medication Sig  . aspirin 81 MG tablet Take 81 mg by mouth daily.    Marland Kitchen atenolol (TENORMIN) 100 MG tablet TAKE 1/2 TO 1 TABLET BY MOUTH DAILY FOR BLOOD PRESSURE  . Biotin 5000 MCG TABS Take by mouth daily.  Marland Kitchen buPROPion (WELLBUTRIN XL) 300 MG 24 hr tablet TAKE 1 TABLET BY MOUTH ONCE DAILY  . Cholecalciferol (VITAMIN D) 2000 units CAPS take 3 caps (6,000 units) / daily  . CINNAMON PO Take 1,000 mg by mouth 2 (two) times daily.   Marland Kitchen ezetimibe (ZETIA) 10 MG tablet TAKE 1 TABLET BY MOUTH DAILY.  . fexofenadine (ALLEGRA) 180 MG tablet Take 180 mg by mouth daily. Patient OTC Allegra PRN  . fluticasone (FLONASE) 50 MCG/ACT nasal spray PLACE 2 SPRAYS INTO BOTH NOSTRILS DAILY.  Marland Kitchen levothyroxine (SYNTHROID, LEVOTHROID) 112 MCG tablet TAKE 1 TABLET BY MOUTH ONCE DAILY FOR THYROID  . lisinopril-hydrochlorothiazide (PRINZIDE,ZESTORETIC) 20-12.5 MG tablet TAKE 1 TABLET BY MOUTH ONCE DAILY FOR BLOOD PRESSURE  . MINIVELLE 0.025 MG/24HR   . Multiple Vitamins-Minerals (HAIR/SKIN/NAILS PO) Take by mouth daily. 3 tablets daily  . Omega-3 Fatty Acids (FISH OIL) 1000 MG CAPS Take by mouth daily.  . vitamin E 400 UNIT capsule Take 400 Units by mouth daily.   No current facility-administered medications on file prior to visit.     Problem list She has Hypothyroidism; Thyroid cancer, micropapillary, pT1a, pNx; Papilledema; Pseudotumor cerebri; Essential  hypertension; Hyperlipidemia; History of prediabetes; Vitamin D deficiency; Medication management; Benign intracranial hypertension; and Laryngopharyngeal reflux on their problem list.   Review of Systems  Constitutional: Negative for chills and diaphoresis.  HENT: Positive for congestion, postnasal drip, sinus pressure and sneezing. Negative for ear pain and sore throat.   Respiratory: Positive for cough. Negative for chest tightness, shortness of breath and wheezing.   Cardiovascular: Negative.   Gastrointestinal: Negative.   Genitourinary: Negative.   Musculoskeletal: Negative for neck pain.  Neurological: Negative for headaches.       Objective:   Physical Exam  Constitutional: She appears well-developed and well-nourished.  HENT:  Head: Normocephalic and atraumatic.  Right Ear: External ear normal.  Nose: Right sinus exhibits maxillary sinus tenderness. Right sinus exhibits no frontal sinus tenderness. Left sinus exhibits maxillary sinus tenderness. Left sinus exhibits no frontal sinus tenderness.  Eyes: Conjunctivae and EOM are normal.  Neck: Normal range of motion. Neck supple.  Cardiovascular: Normal rate, regular rhythm, normal heart sounds and intact distal pulses.  Pulmonary/Chest: Effort normal and breath sounds normal. No respiratory distress. She has no wheezes.  Abdominal: Soft. Bowel sounds are normal.  Lymphadenopathy:    She has no cervical adenopathy.  Skin: Skin is warm and dry.      Assessment & Plan:   Acute maxillary sinusitis, recurrence not specified -     predniSONE (DELTASONE) 20 MG tablet; 2 tablets daily for 3  days, 1 tablet daily for 4 days. -     azithromycin (ZITHROMAX) 250 MG tablet; Take 2 tablets (500 mg) on  Day 1,  followed by 1 tablet (250 mg) once daily on Days 2 through 5. -     promethazine-dextromethorphan (PROMETHAZINE-DM) 6.25-15 MG/5ML syrup; Take 5 mLs by mouth 4 (four) times daily as needed for cough.  The patient was advised to  call immediately if she has any concerning symptoms in the interval. The patient voices understanding of current treatment options and is in agreement with the current care plan.The patient knows to call the clinic with any problems, questions or concerns or go to the ER if any further progression of symptoms.

## 2017-08-12 NOTE — Patient Instructions (Signed)
Make sure you are on an allergy pill, see below for more details. Please take the prednisone as directed below, this is NOT an antibiotic so you do NOT have to finish it. You can take it for a few days and stop it if you are doing better.   Please take the prednisone to help decrease inflammation and therefore decrease symptoms. Take it it with food to avoid GI upset. It can cause increased energy but on the other hand it can make it hard to sleep at night so please take it AT NIGHT WITH DINNER, it takes 8-12 hours to start working so it will NOT affect your sleeping if you take it at night with your food!!  If you are diabetic it will increase your sugars so decrease carbs and monitor your sugars closely.     HOW TO TREAT VIRAL COUGH AND COLD SYMPTOMS:  -Symptoms usually last at least 1 week with the worst symptoms being around day 4.  - colds usually start with a sore throat and end with a cough, and the cough can take 2 weeks to get better.  -No antibiotics are needed for colds, flu, sore throats, cough, bronchitis UNLESS symptoms are longer than 7 days OR if you are getting better then get drastically worse.  -There are a lot of combination medications (Dayquil, Nyquil, Vicks 44, tyelnol cold and sinus, ETC). Please look at the ingredients on the back so that you are treating the correct symptoms and not doubling up on medications/ingredients.    Medicines you can use  Nasal congestion  Little Remedies saline spray (aerosol/mist)- can try this, it is in the kids section - pseudoephedrine (Sudafed)- behind the counter, do not use if you have high blood pressure, medicine that have -D in them.  - phenylephrine (Sudafed PE) -Dextormethorphan + chlorpheniramine (Coridcidin HBP)- okay if you have high blood pressure -Oxymetazoline (Afrin) nasal spray- LIMIT to 3 days -Saline nasal spray -Neti pot (used distilled or bottled water)  Ear pain/congestion  -pseudoephedrine (sudafed) -  Nasonex/flonase nasal spray  Fever  -Acetaminophen (Tyelnol) -Ibuprofen (Advil, motrin, aleve)  Sore Throat  -Acetaminophen (Tyelnol) -Ibuprofen (Advil, motrin, aleve) -Drink a lot of water -Gargle with salt water - Rest your voice (don't talk) -Throat sprays -Cough drops  Body Aches  -Acetaminophen (Tyelnol) -Ibuprofen (Advil, motrin, aleve)  Headache  -Acetaminophen (Tyelnol) -Ibuprofen (Advil, motrin, aleve) - Exedrin, Exedrin Migraine  Allergy symptoms (cough, sneeze, runny nose, itchy eyes) -Claritin or loratadine cheapest but likely the weakest  -Zyrtec or certizine at night because it can make you sleepy -The strongest is allegra or fexafinadine  Cheapest at [employer], [employer], [employer]  Cough  -Dextromethorphan (Delsym)- medicine that has DM in it -Guafenesin (Mucinex/Robitussin) - cough drops - drink lots of water  Chest Congestion  -Guafenesin (Mucinex/Robitussin)  Red Itchy Eyes  - Naphcon-A  Upset Stomach  - Bland diet (nothing spicy, greasy, fried, and high acid foods like tomatoes, oranges, berries) -OKAY- cereal, bread, soup, crackers, rice -Eat smaller more frequent meals -reduce caffeine, no alcohol -Loperamide (Imodium-AD) if diarrhea -Prevacid for heart burn  General health when sick  -Hydration -wash your hands frequently -keep surfaces clean -change pillow cases and sheets often -Get fresh air but do not exercise strenuously -Vitamin D, double up on it - Vitamin C -Zinc       

## 2017-08-21 ENCOUNTER — Other Ambulatory Visit: Payer: Self-pay | Admitting: Internal Medicine

## 2017-08-21 MED FILL — EZETIMIBE 10 MG TABS: 10 | 90 days supply | Qty: 90 | Fill #0

## 2017-09-02 ENCOUNTER — Other Ambulatory Visit: Payer: Self-pay | Admitting: Internal Medicine

## 2017-09-02 MED FILL — BUPROPION HCL XL 300 MG TAB: 300 | 90 days supply | Qty: 90 | Fill #0

## 2017-09-17 ENCOUNTER — Encounter (INDEPENDENT_AMBULATORY_CARE_PROVIDER_SITE_OTHER): Payer: Self-pay

## 2017-09-18 ENCOUNTER — Encounter: Payer: Self-pay | Admitting: Internal Medicine

## 2017-09-18 ENCOUNTER — Ambulatory Visit: Payer: 59 | Admitting: Internal Medicine

## 2017-09-18 VITALS — BP 142/74 | HR 72 | Temp 97.2°F | Resp 16 | Ht 62.5 in | Wt 162.0 lb

## 2017-09-18 DIAGNOSIS — R7303 Prediabetes: Secondary | ICD-10-CM | POA: Diagnosis not present

## 2017-09-18 DIAGNOSIS — Z0001 Encounter for general adult medical examination with abnormal findings: Secondary | ICD-10-CM

## 2017-09-18 DIAGNOSIS — Z8249 Family history of ischemic heart disease and other diseases of the circulatory system: Secondary | ICD-10-CM

## 2017-09-18 DIAGNOSIS — Z1212 Encounter for screening for malignant neoplasm of rectum: Secondary | ICD-10-CM

## 2017-09-18 DIAGNOSIS — E039 Hypothyroidism, unspecified: Secondary | ICD-10-CM

## 2017-09-18 DIAGNOSIS — I10 Essential (primary) hypertension: Secondary | ICD-10-CM

## 2017-09-18 DIAGNOSIS — Z0000 Encounter for general adult medical examination without abnormal findings: Secondary | ICD-10-CM

## 2017-09-18 DIAGNOSIS — R5383 Other fatigue: Secondary | ICD-10-CM

## 2017-09-18 DIAGNOSIS — Z136 Encounter for screening for cardiovascular disorders: Secondary | ICD-10-CM | POA: Diagnosis not present

## 2017-09-18 DIAGNOSIS — E559 Vitamin D deficiency, unspecified: Secondary | ICD-10-CM

## 2017-09-18 DIAGNOSIS — E782 Mixed hyperlipidemia: Secondary | ICD-10-CM | POA: Diagnosis not present

## 2017-09-18 DIAGNOSIS — Z79899 Other long term (current) drug therapy: Secondary | ICD-10-CM

## 2017-09-18 DIAGNOSIS — Z1211 Encounter for screening for malignant neoplasm of colon: Secondary | ICD-10-CM

## 2017-09-18 DIAGNOSIS — F17200 Nicotine dependence, unspecified, uncomplicated: Secondary | ICD-10-CM

## 2017-09-18 NOTE — Progress Notes (Signed)
[facility] Unk Pinto, M.D.     Uvaldo Bristle. Silverio Lay, P.A.-C Liane Comber, [place] [address] [place] [address] Telephone [phone] Telefax [phone] Annual Screening/Preventative Visit & Comprehensive Evaluation &  Examination     This very nice 62 y.o. DBF presents for a Screening/Preventative Visit & comprehensive evaluation and management of multiple medical co-morbidities.  Patient has been followed for HTN, HLD, Prediabetes, Hypothyroidism and Vitamin D Deficiency.      Patient had been dx'd in the past w/ Pseudotumor cerebri consequent of a HA evaluated & LP with sl elevated CSF OP & was treated w/Diamox. Last Eval at [facility] discounted that Dx with a Dx/o anomalous Optic Discs and NOT PAPILLEDEMA.        HTN predates since 70. Patient's BP has been controlled at home and patient denies any cardiac symptoms as chest pain, palpitations, shortness of breath, dizziness or ankle swelling. Today's BP was borderline high normal at 142/74.      Patient's hyperlipidemia is not controlled with diet. Last lipids were not at goal:  Lab Results  Component Value Date   CHOL 203 (H) 06/13/2017   HDL 79 06/13/2017   LDLCALC 108 (H) 11/27/2016   TRIG 75 06/13/2017   CHOLHDL 2.6 06/13/2017      Patient has prediabetes (A1c 5.8%/2011) and patient denies reactive hypoglycemic symptoms, visual blurring, diabetic polys, or paresthesias. Last A1c was Normal & at goal: Lab Results  Component Value Date   HGBA1C 5.6 03/05/2017      Patient underwent Thyroidectomy for a Multinodular Goiter in 2012 at which time a small foci of papillary Thyroid cancer was found and she remains on suppressive thyroid replacement since.      Finally, patient has history of Vitamin D Deficiency ("33"/2008) and last Vitamin D was at goal:  Lab Results  Component Value Date   VD25OH 79 03/05/2017   Current  Outpatient Medications on File Prior to Visit  Medication Sig  . aspirin 81 MG tablet Take 81 mg by mouth daily.    Marland Kitchen atenolol (TENORMIN) 100 MG tablet TAKE 1/2 TO 1 TABLET BY MOUTH DAILY FOR BLOOD PRESSURE  . Biotin 5000 MCG TABS Take by mouth daily.  Marland Kitchen buPROPion (WELLBUTRIN XL) 300 MG 24 hr tablet TAKE 1 TABLET BY MOUTH ONCE DAILY  . Cholecalciferol (VITAMIN D) 2000 units CAPS take 3 caps (6,000 units) / daily  . CINNAMON PO Take 1,000 mg by mouth 2 (two) times daily.   . Cyanocobalamin (VITAMIN B-12) 1000 MCG SUBL Place 1 tablet under the tongue daily.  Marland Kitchen ezetimibe (ZETIA) 10 MG tablet TAKE 1 TABLET BY MOUTH DAILY.  . fexofenadine (ALLEGRA) 180 MG tablet Take 180 mg by mouth daily. Patient OTC Allegra PRN  . fluticasone (FLONASE) 50 MCG/ACT nasal spray PLACE 2 SPRAYS INTO BOTH NOSTRILS DAILY.  Marland Kitchen levothyroxine (SYNTHROID, LEVOTHROID) 112 MCG tablet TAKE 1 TABLET BY MOUTH ONCE DAILY FOR THYROID  . lisinopril-hydrochlorothiazide (PRINZIDE,ZESTORETIC) 20-12.5 MG tablet TAKE 1 TABLET BY MOUTH ONCE DAILY FOR BLOOD PRESSURE  . MINIVELLE 0.025 MG/24HR   . Multiple Vitamins-Minerals (HAIR/SKIN/NAILS PO) Take by mouth daily. 3 tablets daily  . Omega-3 Fatty Acids (FISH OIL) 1000 MG CAPS Take by mouth daily.  . vitamin E 400 UNIT capsule Take 400 Units by mouth daily.   No current facility-administered medications on file prior to visit.    Allergies  Allergen Reactions  . Erythromycin Nausea And Vomiting and  Other (See Comments)    Diarrhea - more severe   Past Medical History:  Diagnosis Date  . Anxiety disorder   . Cancer ([place])    thyroid  . GERD (gastroesophageal reflux disease)   . History of thyroid cancer   . Hyperlipidemia   . Hypertension   . Migraine headache   . Papilledema    bilateral  . Pseudotumor cerebri 04/19/2013  . Thyroid disease   . Vitamin D deficiency    Health Maintenance  Topic Date Due  . MAMMOGRAM  05/25/2017  . PAP SMEAR  04/18/2018  . TETANUS/TDAP   05/02/2025  . COLONOSCOPY  08/12/2027  . INFLUENZA VACCINE  Completed  . Hepatitis C Screening  Completed  . HIV Screening  Completed   Immunization History  Administered Date(s) Administered  . DTaP 12/14/1994  . Hepatitis B 07/15/1989  . PPD Test 07/18/2014, 08/04/2015  . Pneumococcal Polysaccharide-23 10/09/1998  . Tdap 05/03/2015   Last Colon - Recent 01.29.2019 - Dr Collene Mares - recommended 10 yr f/u - due Jan 2029  Last [facility] - Oct 2018 at Gynecology office  Past Surgical History:  Procedure Laterality Date  . ABDOMINAL HYSTERECTOMY  2005  . THYROIDECTOMY  04/25/11   Family History  Problem Relation Age of Onset  . Diabetes Mother   . Heart disease Mother   . Hypertension Mother   . Hyperlipidemia Mother   . COPD Mother   . Depression Father   . Suicidality Father   . Stroke Brother   . Heart disease Brother   . Cancer Maternal Aunt        lung  . Asthma Son    Social History   Tobacco Use  . Smoking status: Current Every Day Smoker    Packs/day: 0.20    Types: Cigarettes  . Smokeless tobacco: Never Used  . Tobacco comment: 1-2 cigarettes per day  Substance Use Topics  . Alcohol use: Yes    Comment: weekends  . Drug use: No    ROS Constitutional: Denies fever, chills, weight loss/gain, headaches, insomnia,  night sweats, and change in appetite. Does c/o fatigue. Eyes: Denies redness, blurred vision, diplopia, discharge, itchy, watery eyes.  ENT: Denies discharge, congestion, post nasal drip, epistaxis, sore throat, earache, hearing loss, dental pain, Tinnitus, Vertigo, Sinus pain, snoring.  Cardio: Denies chest pain, palpitations, irregular heartbeat, syncope, dyspnea, diaphoresis, orthopnea, PND, claudication, edema Respiratory: denies cough, dyspnea, DOE, pleurisy, hoarseness, laryngitis, wheezing.  Gastrointestinal: Denies dysphagia, heartburn, reflux, water brash, pain, cramps, nausea, vomiting, bloating, diarrhea, constipation, hematemesis, melena,  hematochezia, jaundice, hemorrhoids Genitourinary: Denies dysuria, frequency, urgency, nocturia, hesitancy, discharge, hematuria, flank pain Breast: Breast lumps, nipple discharge, bleeding.  Musculoskeletal: Denies arthralgia, myalgia, stiffness, Jt. Swelling, pain, limp, and strain/sprain. Denies falls. Skin: Denies puritis, rash, hives, warts, acne, eczema, changing in skin lesion Neuro: No weakness, tremor, incoordination, spasms, paresthesia, pain Psychiatric: Denies confusion, memory loss, sensory loss. Denies Depression. Endocrine: Denies change in weight, skin, hair change, nocturia, and paresthesia, diabetic polys, visual blurring, hyper / hypo glycemic episodes.  Heme/Lymph: No excessive bleeding, bruising, enlarged lymph nodes.  Physical Exam  BP (!) 142/74   Pulse 72   Temp (!) 97.2 F (36.2 C)   Resp 16   Ht 5' 2.5" (1.588 m)   Wt 162 lb (73.5 kg)   BMI 29.16 kg/m   General Appearance: Well nourished, well groomed and in no apparent distress.  Eyes: PERRLA, EOMs, conjunctiva no swelling or erythema, normal fundi and vessels. Sinuses: No  frontal/maxillary tenderness ENT/Mouth: EACs patent / TMs  nl. Nares clear without erythema, swelling, mucoid exudates. Oral hygiene is good. No erythema, swelling, or exudate. Tongue normal, non-obstructing. Tonsils not swollen or erythematous. Hearing normal.  Neck: Supple, thyroid not palpable. No bruits, nodes or JVD. Respiratory: Respiratory effort normal.  BS equal and clear bilateral without rales, rhonci, wheezing or stridor. Cardio: Heart sounds are normal with regular rate and rhythm and no murmurs, rubs or gallops. Peripheral pulses are normal and equal bilaterally without edema. No aortic or femoral bruits. Chest: symmetric with normal excursions and percussion. Breasts: Deferred to GYN Abdomen: Flat, soft with bowel sounds active. Nontender, no guarding, rebound, hernias, masses, or organomegaly.  Lymphatics: Non tender  without lymphadenopathy.  Musculoskeletal: Full ROM all peripheral extremities, joint stability, 5/5 strength, and normal gait. Skin: Warm and dry without rashes, lesions, cyanosis, clubbing or  ecchymosis.  Neuro: Cranial nerves intact, reflexes equal bilaterally. Normal muscle tone, no cerebellar symptoms. Sensation intact.  Pysch: Alert and oriented X 3, normal affect, Insight and Judgment appropriate.   Assessment and Plan  1. Annual Preventative Screening Examination  2. Essential hypertension  - EKG 12-Lead - [place], RETROPERITNL ABD,  LTD - Urinalysis, Routine w reflex microscopic - Microalbumin / creatinine urine ratio - CBC with Differential/Platelet - BASIC METABOLIC PANEL WITH GFR - Magnesium - TSH  3. Hyperlipidemia, mixed  - EKG 12-Lead - [place], RETROPERITNL ABD,  LTD - Hepatic function panel - Lipid panel - TSH  4. Prediabetes  - EKG 12-Lead - [place], RETROPERITNL ABD,  LTD - Hemoglobin A1c - Insulin, random  5. Vitamin D deficiency  - VITAMIN D 25 Hydroxyl  6. Hypothyroidism  - TSH  7. Screening for colorectal cancer  - POC Hemoccult Bld/Stl   8. Screening for ischemic heart disease  - EKG 12-Lead  9. FHx: heart disease  - EKG 12-Lead - [place], RETROPERITNL ABD,  LTD  10. Smoker  - EKG 12-Lead - [place], RETROPERITNL ABD,  LTD  11. Screening for AAA (aortic abdominal aneurysm)  - [place], RETROPERITNL ABD,  LTD  12. Fatigue, unspecified type  - Iron,Total/Total Iron Binding Cap - Vitamin B12 - CBC with Differential/Platelet  13. Medication management  - Urinalysis, Routine w reflex microscopic - Microalbumin / creatinine urine ratio - CBC with Differential/Platelet - BASIC METABOLIC PANEL WITH GFR - Hepatic function panel - Magnesium - Lipid panel - TSH - Hemoglobin A1c - Insulin, random - VITAMIN D 25 Hydroxy         Patient was counseled in prudent diet to achieve/maintain BMI less than 25 for weight control, BP monitoring, regular  exercise and medications. Discussed med's effects and SE's. Screening labs and tests as requested with regular follow-up as recommended. Over 40 minutes of exam, counseling, chart review and high complex critical decision making was performed.

## 2017-09-18 NOTE — Patient Instructions (Signed)

## 2017-09-19 ENCOUNTER — Other Ambulatory Visit: Payer: Self-pay | Admitting: Internal Medicine

## 2017-09-19 LAB — BASIC METABOLIC PANEL WITH GFR
BUN: 9 mg/dL (ref 7–25)
CALCIUM: 9.6 mg/dL (ref 8.6–10.4)
CO2: 31 mmol/L (ref 20–32)
CREATININE: 0.95 mg/dL (ref 0.50–0.99)
Chloride: 100 mmol/L (ref 98–110)
GFR, Est African American: 75 mL/min/{1.73_m2} (ref >=60)
GFR, Est Non African American: 65 mL/min/{1.73_m2} (ref >=60)
GLUCOSE: 95 mg/dL (ref 65–99)
Potassium: 4.1 mmol/L (ref 3.5–5.3)
SODIUM: 139 mmol/L (ref 135–146)

## 2017-09-19 LAB — LIPID PANEL
Cholesterol: 212 mg/dL — ABNORMAL HIGH (ref <200)
HDL: 77 mg/dL (ref >50)
LDL Cholesterol (Calc): 117 mg/dL (calc) — ABNORMAL HIGH
Non-HDL Cholesterol (Calc): 135 mg/dL (calc) — ABNORMAL HIGH (ref <130)
Total CHOL/HDL Ratio: 2.8 (calc) (ref <5.0)
Triglycerides: 83 mg/dL (ref <150)

## 2017-09-19 LAB — URINALYSIS, ROUTINE W REFLEX MICROSCOPIC
BILIRUBIN URINE: NEGATIVE
GLUCOSE, UA: NEGATIVE
HGB URINE DIPSTICK: NEGATIVE
Ketones, ur: NEGATIVE
Leukocytes, UA: NEGATIVE
NITRITE: NEGATIVE
PH: 7 (ref 5.0–8.0)
Protein, ur: NEGATIVE
SPECIFIC GRAVITY, URINE: 1.011 (ref 1.001–1.03)

## 2017-09-19 LAB — CBC WITH DIFFERENTIAL/PLATELET
BASOS PCT: 0.6 %
Basophils Absolute: 28 cells/uL (ref 0–200)
Eosinophils Absolute: 71 cells/uL (ref 15–500)
Eosinophils Relative: 1.5 %
HEMATOCRIT: 43 % (ref 35.0–45.0)
HEMOGLOBIN: 14.2 g/dL (ref 11.7–15.5)
LYMPHS ABS: 1354 {cells}/uL (ref 850–3900)
MCH: 31.3 pg (ref 27.0–33.0)
MCHC: 33 g/dL (ref 32.0–36.0)
MCV: 94.9 fL (ref 80.0–100.0)
MONOS PCT: 7.2 %
MPV: 11.8 fL (ref 7.5–12.5)
Neutro Abs: 2909 cells/uL (ref 1500–7800)
Neutrophils Relative %: 61.9 %
Platelets: 295 10*3/uL (ref 140–400)
RBC: 4.53 10*6/uL (ref 3.80–5.10)
RDW: 12.9 % (ref 11.0–15.0)
Total Lymphocyte: 28.8 %
WBC mixed population: 338 cells/uL (ref 200–950)
WBC: 4.7 10*3/uL (ref 3.8–10.8)

## 2017-09-19 LAB — IRON, TOTAL/TOTAL IRON BINDING CAP
%SAT: 40 % (calc) (ref 11–50)
Iron: 144 ug/dL (ref 45–160)
TIBC: 358 ug/dL (ref 250–450)

## 2017-09-19 LAB — MICROALBUMIN / CREATININE URINE RATIO: Creatinine, Urine: 37 mg/dL (ref 20–275)

## 2017-09-19 LAB — VITAMIN D 25 HYDROXY (VIT D DEFICIENCY, FRACTURES): VIT D 25 HYDROXY: 75 ng/mL (ref 30–100)

## 2017-09-19 LAB — INSULIN, RANDOM: Insulin: 6.4 u[IU]/mL (ref 2.0–19.6)

## 2017-09-19 LAB — MAGNESIUM: Magnesium: 2.2 mg/dL (ref 1.5–2.5)

## 2017-09-19 LAB — HEPATIC FUNCTION PANEL
AG Ratio: 1.5 (calc) (ref 1.0–2.5)
ALBUMIN MSPROF: 4.4 g/dL (ref 3.6–5.1)
ALKALINE PHOSPHATASE (APISO): 65 U/L (ref 33–130)
ALT: 22 U/L (ref 6–29)
AST: 17 U/L (ref 10–35)
BILIRUBIN TOTAL: 0.5 mg/dL (ref 0.2–1.2)
Bilirubin, Direct: 0.1 mg/dL (ref 0.0–0.2)
Globulin: 2.9 g/dL (calc) (ref 1.9–3.7)
Indirect Bilirubin: 0.4 mg/dL (calc) (ref 0.2–1.2)
TOTAL PROTEIN: 7.3 g/dL (ref 6.1–8.1)

## 2017-09-19 LAB — HEMOGLOBIN A1C
HEMOGLOBIN A1C: 5.5 %{Hb} (ref <5.7)
Mean Plasma Glucose: 111 (calc)
eAG (mmol/L): 6.2 (calc)

## 2017-09-19 LAB — VITAMIN B12

## 2017-09-19 LAB — TSH: TSH: 1.68 mIU/L (ref 0.40–4.50)

## 2017-09-19 MED ORDER — ROSUVASTATIN CALCIUM 40 MG PO TABS: ORAL_TABLET | ORAL | 5 refills | Status: DC

## 2017-09-19 MED FILL — ROSUVASTATIN CALCIUM 40 MG: 40 | 30 days supply | Qty: 30 | Fill #0

## 2017-10-27 DIAGNOSIS — H5201 Hypermetropia, right eye: Secondary | ICD-10-CM | POA: Diagnosis not present

## 2017-10-27 DIAGNOSIS — H52222 Regular astigmatism, left eye: Secondary | ICD-10-CM | POA: Diagnosis not present

## 2017-10-27 DIAGNOSIS — H524 Presbyopia: Secondary | ICD-10-CM | POA: Diagnosis not present

## 2017-11-25 MED FILL — ROSUVASTATIN CALCIUM 40 MG: 40 | 30 days supply | Qty: 30 | Fill #1

## 2017-12-02 ENCOUNTER — Other Ambulatory Visit: Payer: Self-pay | Admitting: Physician Assistant

## 2017-12-02 MED FILL — LEVOTHYROXINE 112 MCG TAB: 112 | 90 days supply | Qty: 90 | Fill #0

## 2017-12-09 MED FILL — buPROPion HCL ER (XL) 300 M: 300 | 90 days supply | Qty: 90 | Fill #1

## 2017-12-10 ENCOUNTER — Other Ambulatory Visit: Payer: Self-pay | Admitting: Internal Medicine

## 2017-12-22 DIAGNOSIS — H1132 Conjunctival hemorrhage, left eye: Secondary | ICD-10-CM | POA: Diagnosis not present

## 2017-12-22 MED FILL — ESTRADIOL 0.025 MG/24HR PTT: 0.025 | 84 days supply | Qty: 24 | Fill #1

## 2017-12-22 MED FILL — EZETIMIBE 10 MG TABS: 10 | 90 days supply | Qty: 90 | Fill #1

## 2017-12-31 ENCOUNTER — Encounter: Payer: Self-pay | Admitting: Adult Health

## 2017-12-31 ENCOUNTER — Ambulatory Visit: Payer: 59 | Admitting: Adult Health

## 2017-12-31 VITALS — BP 142/82 | HR 69 | Temp 97.7°F | Ht 62.5 in | Wt 160.0 lb

## 2017-12-31 DIAGNOSIS — E890 Postprocedural hypothyroidism: Secondary | ICD-10-CM

## 2017-12-31 DIAGNOSIS — Z79899 Other long term (current) drug therapy: Secondary | ICD-10-CM | POA: Diagnosis not present

## 2017-12-31 DIAGNOSIS — E559 Vitamin D deficiency, unspecified: Secondary | ICD-10-CM | POA: Diagnosis not present

## 2017-12-31 DIAGNOSIS — G932 Benign intracranial hypertension: Secondary | ICD-10-CM | POA: Diagnosis not present

## 2017-12-31 DIAGNOSIS — Z87898 Personal history of other specified conditions: Secondary | ICD-10-CM | POA: Diagnosis not present

## 2017-12-31 DIAGNOSIS — I10 Essential (primary) hypertension: Secondary | ICD-10-CM

## 2017-12-31 DIAGNOSIS — E782 Mixed hyperlipidemia: Secondary | ICD-10-CM | POA: Diagnosis not present

## 2017-12-31 NOTE — Patient Instructions (Signed)
Increase atenolol to 75 mg daily, if still running high, can increase to full 100 mg       Monitor your blood pressure at home, please keep a record and bring that in with you to your next office visit.   Go to the ER if any CP, SOB, nausea, dizziness, severe HA, changes vision/speech  Due to a recent study, SPRINT, we have changed our goal for the systolic or top blood pressure number. Ideally we want your top number at 120.  In the [facility] Trial, 5000 people were randomized to a goal BP of 120 and 5000 people were randomized to a goal BP of less than 140. The patients with the goal BP at 120 had LESS DEMENTIA, LESS HEART ATTACKS, AND LESS STROKES, AS WELL AS OVERALL DECREASED MORTALITY OR DEATH RATE.   If you are willing, our goal BP is the top number of 120.  Your most recent BP: BP: (!) 142/82   Take your medications faithfully as instructed. Maintain a healthy weight. Get at least 150 minutes of aerobic exercise per week. Minimize salt intake. Minimize alcohol intake  DASH Eating Plan DASH stands for "Dietary Approaches to Stop Hypertension." The DASH eating plan is a healthy eating plan that has been shown to reduce high blood pressure (hypertension). Additional health benefits may include reducing the risk of type 2 diabetes mellitus, heart disease, and stroke. The DASH eating plan may also help with weight loss. WHAT DO I NEED TO KNOW ABOUT THE DASH EATING PLAN? For the DASH eating plan, you will follow these general guidelines:  Choose foods with a percent daily value for sodium of less than 5% (as listed on the food label).  Use salt-free seasonings or herbs instead of table salt or sea salt.  Check with your health care provider or pharmacist before using salt substitutes.  Eat lower-sodium products, often labeled as "lower sodium" or "no salt added."  Eat fresh foods.  Eat more vegetables, fruits, and low-fat dairy products.  Choose whole grains. Look for the word  "whole" as the first word in the ingredient list.  Choose fish and skinless chicken or [place] more often than red meat. Limit fish, poultry, and meat to 6 oz (170 g) each day.  Limit sweets, desserts, sugars, and sugary drinks.  Choose heart-healthy fats.  Limit cheese to 1 oz (28 g) per day.  Eat more home-cooked food and less restaurant, buffet, and fast food.  Limit fried foods.  Cook foods using methods other than frying.  Limit canned vegetables. If you do use them, rinse them well to decrease the sodium.  When eating at a restaurant, ask that your food be prepared with less salt, or no salt if possible. WHAT FOODS CAN I EAT? Seek help from a dietitian for individual calorie needs. Grains Whole grain or whole wheat bread. Brown rice. Whole grain or whole wheat pasta. Quinoa, bulgur, and whole grain cereals. Low-sodium cereals. Corn or whole wheat flour tortillas. Whole grain cornbread. Whole grain crackers. Low-sodium crackers. Vegetables Fresh or frozen vegetables (raw, steamed, roasted, or grilled). Low-sodium or reduced-sodium tomato and vegetable juices. Low-sodium or reduced-sodium tomato sauce and paste. Low-sodium or reduced-sodium canned vegetables.  Fruits All fresh, canned (in natural juice), or frozen fruits. Meat and Other Protein Products Ground beef (85% or leaner), grass-fed beef, or beef trimmed of fat. Skinless chicken or [place]. Ground chicken or [place]. Pork trimmed of fat. All fish and seafood. Eggs. Dried beans, peas, or lentils. Unsalted  nuts and seeds. Unsalted canned beans. Dairy Low-fat dairy products, such as skim or 1% milk, 2% or reduced-fat cheeses, low-fat ricotta or cottage cheese, or plain low-fat yogurt. Low-sodium or reduced-sodium cheeses. Fats and Oils Tub margarines without trans fats. Light or reduced-fat mayonnaise and salad dressings (reduced sodium). Avocado. Safflower, olive, or canola oils. Natural peanut or almond  butter. Other Unsalted popcorn and pretzels. The items listed above may not be a complete list of recommended foods or beverages. Contact your dietitian for more options. WHAT FOODS ARE NOT RECOMMENDED? Grains White bread. White pasta. White rice. Refined cornbread. Bagels and croissants. Crackers that contain trans fat. Vegetables Creamed or fried vegetables. Vegetables in a cheese sauce. Regular canned vegetables. Regular canned tomato sauce and paste. Regular tomato and vegetable juices. Fruits Dried fruits. Canned fruit in light or heavy syrup. Fruit juice. Meat and Other Protein Products Fatty cuts of meat. Ribs, chicken wings, bacon, sausage, bologna, salami, chitterlings, fatback, hot dogs, bratwurst, and packaged luncheon meats. Salted nuts and seeds. Canned beans with salt. Dairy Whole or 2% milk, cream, half-and-half, and cream cheese. Whole-fat or sweetened yogurt. Full-fat cheeses or blue cheese. Nondairy creamers and whipped toppings. Processed cheese, cheese spreads, or cheese curds. Condiments Onion and garlic salt, seasoned salt, table salt, and sea salt. Canned and packaged gravies. Worcestershire sauce. Tartar sauce. Barbecue sauce. Teriyaki sauce. Soy sauce, including reduced sodium. Steak sauce. Fish sauce. Oyster sauce. Cocktail sauce. Horseradish. Ketchup and mustard. Meat flavorings and tenderizers. Bouillon cubes. Hot sauce. Tabasco sauce. Marinades. Taco seasonings. Relishes. Fats and Oils Butter, stick margarine, lard, shortening, ghee, and bacon fat. Coconut, palm kernel, or palm oils. Regular salad dressings. Other Pickles and olives. Salted popcorn and pretzels. The items listed above may not be a complete list of foods and beverages to avoid. Contact your dietitian for more information. WHERE CAN I FIND MORE INFORMATION? National Heart, Lung, and Blood Institute: travelstabloid.com Document Released: 06/20/2011 Document Revised:  11/15/2013 Document Reviewed: 05/05/2013 [facility] Patient Information 2015 [place], [place]. This information is not intended to replace advice given to you by your health care provider. Make sure you discuss any questions you have with your health care provider.

## 2017-12-31 NOTE — Progress Notes (Signed)
FOLLOW UP  Assessment and Plan:   Hypertension Running a bit high; after discussion increase atenolol to 75 mg daily, continue lisinopril/hctz as currently taking, can increase to 100 mg of atenolol if still running above 130/80  Monitor blood pressure at home; patient to call if consistently greater than 130/80 Continue DASH diet - emphasized low sodium and increasing fluid intake  Reminder to go to the ER if any CP, SOB, nausea, dizziness, severe HA, changes vision/speech, left arm numbness and tingling and jaw pain.  Cholesterol Currently on zetia 10 mg, recently added crestor 40 mg three times weekly; anticipate significant improvement Continue low cholesterol diet and exercise; discussed increasing soluble fiber or adding supplement Check lipid panel.   History of Prediabetes Well controlled by lifestyle changes  Continue diet and exercise.  Check A1C per patient preference  Hypothyroidism continue medications the same reminded to take on an empty stomach 30-39mins before food.  check TSH level  Vitamin D Def/ osteoporosis prevention Continue supplementation Has been stable at goal at last few visits; defer vit D level  Continue diet and meds as discussed. Further disposition pending results of labs. Discussed med's effects and SE's.   Over 30 minutes of exam, counseling, chart review, and critical decision making was performed.   Future Appointments  Date Time Provider [place]  04/06/2018  3:30 PM Unk Pinto, MD [facility] None  10/05/2018  3:00 PM Unk Pinto, MD [facility] None    ----------------------------------------------------------------------------------------------------------------------  HPI 62 y.o. female  presents for 3 month follow up on hypertension, cholesterol, history of prediabetes, post surgical hypothyroid and vitamin D deficiency. Patient had been diagnosied in the past with Pseudotumor cerebri consequent of a HA evaluated LP  with slightly elevated CSF OP and was treated with Diamox. Last Eval at [facility] Ophthalmology discounted that Dx with a Dx/o anomalous Optic Discs and not indeed papilloedema. Has been released by neurology and only follow up if needed.   BMI is Body mass index is 28.8 kg/m., she has been working on diet and exercise. She reports she typically gets in 8000-10000 steps daily.  Wt Readings from Last 3 Encounters:  12/31/17 160 lb (72.6 kg)  09/18/17 162 lb (73.5 kg)  08/12/17 159 lb 3.2 oz (72.2 kg)   Her blood pressure has been controlled at home, today their BP is BP: (!) 142/82 She admits she missed her atenolol last night.   She does not workout. She denies chest pain, shortness of breath, dizziness.   She is on cholesterol medication (zetia 10 mg daily, rosuvastatin 40 mg three times a week) and denies myalgias. Her cholesterol is not at goal. The cholesterol last visit was:   Lab Results  Component Value Date   CHOL 212 (H) 09/18/2017   HDL 77 09/18/2017   LDLCALC 117 (H) 09/18/2017   TRIG 83 09/18/2017   CHOLHDL 2.8 09/18/2017    She has been working on diet and exercise for history of prediabetes, and denies increased appetite, nausea, paresthesia of the feet, polydipsia, polyuria, visual disturbances and vomiting. Last A1C in the office was:  Lab Results  Component Value Date   HGBA1C 5.5 09/18/2017   She is on thyroid medication. Her medication was not changed last visit.   Lab Results  Component Value Date   TSH 1.68 09/18/2017  . Patient is on Vitamin D supplement and at goal:    Lab Results  Component Value Date   VD25OH 75 09/18/2017      Current Medications:  Current Outpatient Medications on File Prior to Visit  Medication Sig  . aspirin 81 MG tablet Take 81 mg by mouth daily.    Marland Kitchen atenolol (TENORMIN) 100 MG tablet TAKE 1/2 TO 1 TABLET BY MOUTH DAILY FOR BLOOD PRESSURE  . Biotin 5000 MCG TABS Take by mouth daily.  Marland Kitchen buPROPion (WELLBUTRIN XL) 300 MG 24 hr tablet  TAKE 1 TABLET BY MOUTH ONCE DAILY  . Cholecalciferol (VITAMIN D) 2000 units CAPS take 3 caps (6,000 units) / daily  . CINNAMON PO Take 1,000 mg by mouth 2 (two) times daily.   . Cyanocobalamin (VITAMIN B-12) 1000 MCG SUBL Place 1 tablet under the tongue daily.  Marland Kitchen ezetimibe (ZETIA) 10 MG tablet TAKE 1 TABLET BY MOUTH DAILY.  . fexofenadine (ALLEGRA) 180 MG tablet Take 180 mg by mouth daily. Patient OTC Allegra PRN  . fluticasone (FLONASE) 50 MCG/ACT nasal spray PLACE 2 SPRAYS INTO BOTH NOSTRILS DAILY.  Marland Kitchen levothyroxine (SYNTHROID, LEVOTHROID) 112 MCG tablet TAKE 1 TABLET BY MOUTH ONCE DAILY FOR THYROID  . lisinopril-hydrochlorothiazide (PRINZIDE,ZESTORETIC) 20-12.5 MG tablet TAKE 1 TABLET BY MOUTH ONCE DAILY FOR BLOOD PRESSURE  . MINIVELLE 0.025 MG/24HR   . Multiple Vitamins-Minerals (HAIR/SKIN/NAILS PO) Take by mouth daily. 3 tablets daily  . Omega-3 Fatty Acids (FISH OIL) 1000 MG CAPS Take by mouth daily.  . rosuvastatin (CRESTOR) 40 MG tablet Take 1/2 to 1 tablet daily or as directed for Cholesterol  . vitamin E 400 UNIT capsule Take 400 Units by mouth daily.   No current facility-administered medications on file prior to visit.      Allergies:  Allergies  Allergen Reactions  . Erythromycin Nausea And Vomiting and Other (See Comments)    Diarrhea - more severe     Medical History:  Past Medical History:  Diagnosis Date  . Anxiety disorder   . Cancer ([place])    thyroid  . GERD (gastroesophageal reflux disease)   . History of thyroid cancer   . Hyperlipidemia   . Hypertension   . Migraine headache   . Papilledema    bilateral  . Pseudotumor cerebri 04/19/2013  . Thyroid disease   . Vitamin D deficiency    Family history- Reviewed and unchanged Social history- Reviewed and unchanged   Review of Systems:  Review of Systems  Constitutional: Negative for malaise/fatigue and weight loss.  HENT: Negative for hearing loss and tinnitus.   Eyes: Negative for blurred vision and  double vision.  Respiratory: Negative for cough, shortness of breath and wheezing.   Cardiovascular: Negative for chest pain, palpitations, orthopnea, claudication and leg swelling.  Gastrointestinal: Negative for abdominal pain, blood in stool, constipation, diarrhea, heartburn, melena, nausea and vomiting.  Genitourinary: Negative.   Musculoskeletal: Negative for joint pain and myalgias.  Skin: Negative for rash.  Neurological: Negative for dizziness, tingling, sensory change, weakness and headaches.  Endo/Heme/Allergies: Negative for polydipsia.  Psychiatric/Behavioral: Negative.   All other systems reviewed and are negative.     Physical Exam: BP (!) 142/82   Pulse 69   Temp 97.7 F (36.5 C)   Ht 5' 2.5" (1.588 m)   Wt 160 lb (72.6 kg)   SpO2 98%   BMI 28.80 kg/m  Wt Readings from Last 3 Encounters:  12/31/17 160 lb (72.6 kg)  09/18/17 162 lb (73.5 kg)  08/12/17 159 lb 3.2 oz (72.2 kg)   General Appearance: Well nourished, in no apparent distress. Eyes: PERRLA, EOMs, conjunctiva no swelling or erythema Sinuses: No Frontal/maxillary tenderness ENT/Mouth: Ext aud  canals clear, TMs without erythema, bulging. No erythema, swelling, or exudate on post pharynx.  Tonsils not swollen or erythematous. Hearing normal.  Neck: Supple, thyroid normal.  Respiratory: Respiratory effort normal, BS equal bilaterally without rales, rhonchi, wheezing or stridor.  Cardio: RRR with no MRGs. Brisk peripheral pulses without edema.  Abdomen: Soft, + BS.  Non tender, no guarding, rebound, hernias, masses. Lymphatics: Non tender without lymphadenopathy.  Musculoskeletal: Full ROM, 5/5 strength, Normal gait Skin: Warm, dry without rashes, lesions, ecchymosis.  Neuro: Cranial nerves intact. No cerebellar symptoms.  Psych: Awake and oriented X 3, normal affect, Insight and Judgment appropriate.    Izora Ribas, NP 4:10 PM [facility]

## 2018-01-01 LAB — COMPLETE METABOLIC PANEL WITH GFR
AG Ratio: 1.6 (calc) (ref 1.0–2.5)
ALKALINE PHOSPHATASE (APISO): 62 U/L (ref 33–130)
ALT: 26 U/L (ref 6–29)
AST: 19 U/L (ref 10–35)
Albumin: 4.3 g/dL (ref 3.6–5.1)
BUN: 15 mg/dL (ref 7–25)
CHLORIDE: 102 mmol/L (ref 98–110)
CO2: 32 mmol/L (ref 20–32)
CREATININE: 0.92 mg/dL (ref 0.50–0.99)
Calcium: 9.2 mg/dL (ref 8.6–10.4)
GFR, Est African American: 78 mL/min/{1.73_m2} (ref >=60)
GFR, Est Non African American: 67 mL/min/{1.73_m2} (ref >=60)
GLUCOSE: 81 mg/dL (ref 65–99)
Globulin: 2.7 g/dL (calc) (ref 1.9–3.7)
Potassium: 4.2 mmol/L (ref 3.5–5.3)
Sodium: 140 mmol/L (ref 135–146)
Total Bilirubin: 0.4 mg/dL (ref 0.2–1.2)
Total Protein: 7 g/dL (ref 6.1–8.1)

## 2018-01-01 LAB — CBC WITH DIFFERENTIAL/PLATELET
BASOS PCT: 0.5 %
Basophils Absolute: 32 cells/uL (ref 0–200)
EOS ABS: 38 {cells}/uL (ref 15–500)
Eosinophils Relative: 0.6 %
HCT: 42.2 % (ref 35.0–45.0)
Hemoglobin: 14.1 g/dL (ref 11.7–15.5)
Lymphs Abs: 1682 cells/uL (ref 850–3900)
MCH: 31.3 pg (ref 27.0–33.0)
MCHC: 33.4 g/dL (ref 32.0–36.0)
MCV: 93.6 fL (ref 80.0–100.0)
MONOS PCT: 6.7 %
MPV: 11.8 fL (ref 7.5–12.5)
Neutro Abs: 4127 cells/uL (ref 1500–7800)
Neutrophils Relative %: 65.5 %
PLATELETS: 262 10*3/uL (ref 140–400)
RBC: 4.51 10*6/uL (ref 3.80–5.10)
RDW: 12.1 % (ref 11.0–15.0)
TOTAL LYMPHOCYTE: 26.7 %
WBC mixed population: 422 cells/uL (ref 200–950)
WBC: 6.3 10*3/uL (ref 3.8–10.8)

## 2018-01-01 LAB — LIPID PANEL
CHOL/HDL RATIO: 2.1 (calc) (ref <5.0)
CHOLESTEROL: 152 mg/dL (ref <200)
HDL: 72 mg/dL (ref >50)
LDL Cholesterol (Calc): 67 mg/dL (calc)
Non-HDL Cholesterol (Calc): 80 mg/dL (calc) (ref <130)
Triglycerides: 57 mg/dL (ref <150)

## 2018-01-01 LAB — HEMOGLOBIN A1C
Hgb A1c MFr Bld: 5.5 % of total Hgb (ref <5.7)
Mean Plasma Glucose: 111 (calc)
eAG (mmol/L): 6.2 (calc)

## 2018-01-01 LAB — TSH: TSH: 2.38 m[IU]/L (ref 0.40–4.50)

## 2018-01-14 ENCOUNTER — Other Ambulatory Visit: Payer: Self-pay | Admitting: Internal Medicine

## 2018-01-14 MED FILL — LISINOPRIL-HCTZ 20-25 MG TA: 20-25 | 90 days supply | Qty: 90 | Fill #0

## 2018-02-09 ENCOUNTER — Other Ambulatory Visit: Payer: Self-pay | Admitting: Internal Medicine

## 2018-02-09 MED FILL — ATENOLOL 100 MG TABLET: 100 | 90 days supply | Qty: 90 | Fill #0

## 2018-03-24 ENCOUNTER — Other Ambulatory Visit: Payer: Self-pay | Admitting: Adult Health

## 2018-03-24 MED FILL — ROSUVASTATIN CALCIUM 40 MG: 40 | 30 days supply | Qty: 30 | Fill #2

## 2018-03-24 MED FILL — EZETIMIBE 10 MG TABLET: 10 | 90 days supply | Qty: 90 | Fill #0

## 2018-03-24 MED FILL — buPROPion HCL ER (XL) 300 M: 300 | 90 days supply | Qty: 90 | Fill #0

## 2018-04-01 MED FILL — LEVOTHYROXINE 112 MCG TAB: 112 | 90 days supply | Qty: 90 | Fill #1

## 2018-04-06 ENCOUNTER — Ambulatory Visit (INDEPENDENT_AMBULATORY_CARE_PROVIDER_SITE_OTHER): Payer: 59 | Admitting: Internal Medicine

## 2018-04-06 ENCOUNTER — Encounter: Payer: Self-pay | Admitting: Internal Medicine

## 2018-04-06 VITALS — BP 152/90 | HR 72 | Temp 97.5°F | Resp 16 | Ht 62.5 in | Wt 160.2 lb

## 2018-04-06 DIAGNOSIS — E782 Mixed hyperlipidemia: Secondary | ICD-10-CM

## 2018-04-06 DIAGNOSIS — Z79899 Other long term (current) drug therapy: Secondary | ICD-10-CM | POA: Diagnosis not present

## 2018-04-06 DIAGNOSIS — E039 Hypothyroidism, unspecified: Secondary | ICD-10-CM | POA: Diagnosis not present

## 2018-04-06 DIAGNOSIS — E559 Vitamin D deficiency, unspecified: Secondary | ICD-10-CM

## 2018-04-06 DIAGNOSIS — R7303 Prediabetes: Secondary | ICD-10-CM | POA: Diagnosis not present

## 2018-04-06 DIAGNOSIS — I10 Essential (primary) hypertension: Secondary | ICD-10-CM | POA: Diagnosis not present

## 2018-04-06 NOTE — Progress Notes (Addendum)
This very nice 62 y.o. DBF  presents for 6 month follow up with HTN, HLD, Pre-Diabetes and Vitamin D Deficiency.      Patient is treated for HTN (1992) & BP has been controlled at home. Today's BP is sl elevated 152/90-right and 152/86-left). Patient has had no complaints of any cardiac type chest pain, palpitations, dyspnea / orthopnea / PND, dizziness, claudication, or dependent edema.     Hyperlipidemia is controlled with diet & meds. Patient denies myalgias or other med SE's. Last Lipids were at goal: Lab Results  Component Value Date   CHOL 152 12/31/2017   HDL 72 12/31/2017   LDLCALC 67 12/31/2017   TRIG 57 12/31/2017   CHOLHDL 2.1 12/31/2017      Also, the patient has history of PreDiabetes (A1c 5.8%/2011) and has had no symptoms of reactive hypoglycemia, diabetic polys, paresthesias or visual blurring.  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         Further, the patient also has history of Vitamin D Deficiency ("33"/2008) and supplements vitamin D without any suspected side-effects. Last vitamin D was at goal:  Lab Results  Component Value Date   VD25OH 30 09/18/2017   Current Outpatient Medications on File Prior to Visit  Medication Sig  . aspirin 81 MG tablet Take 81 mg by mouth daily.    Marland Kitchen atenolol (TENORMIN) 100 MG tablet TAKE 1/2 TO 1 TABLET BY MOUTH DAILY FOR BLOOD PRESSURE  . Biotin 5000 MCG TABS Take by mouth daily.  Marland Kitchen buPROPion (WELLBUTRIN XL) 300 MG 24 hr tablet TAKE 1 TABLET BY MOUTH ONCE DAILY  . Cholecalciferol (VITAMIN D) 2000 units CAPS take 3 caps (6,000 units) / daily  . CINNAMON PO Take 1,000 mg by mouth 2 (two) times daily.   . Cyanocobalamin (VITAMIN B-12) 1000 MCG SUBL Place 1 tablet under the tongue daily.  Marland Kitchen ezetimibe (ZETIA) 10 MG  tablet TAKE 1 TABLET BY MOUTH ONCE DAILY  . fexofenadine (ALLEGRA) 180 MG tablet Take 180 mg by mouth daily. Patient OTC Allegra PRN  . fluticasone (FLONASE) 50 MCG/ACT nasal spray PLACE 2 SPRAYS INTO BOTH NOSTRILS DAILY.  Marland Kitchen levothyroxine (SYNTHROID, LEVOTHROID) 112 MCG tablet TAKE 1 TABLET BY MOUTH ONCE DAILY FOR THYROID  . lisinopril-hydrochlorothiazide (PRINZIDE,ZESTORETIC) 20-12.5 MG tablet TAKE 1 TABLET BY MOUTH ONCE DAILY FOR BLOOD PRESSURE  . lisinopril-hydrochlorothiazide (PRINZIDE,ZESTORETIC) 20-25 MG tablet TAKE 1 TABLET BY MOUTH ONCE DAILY  . MINIVELLE 0.025 MG/24HR   . Multiple Vitamins-Minerals (HAIR/SKIN/NAILS PO) Take by mouth daily. 3 tablets daily  . Omega-3 Fatty Acids (FISH OIL) 1000 MG CAPS Take by mouth daily.  . rosuvastatin (CRESTOR) 40 MG tablet Take 1/2 to 1 tablet daily or as directed for Cholesterol  . vitamin E 400 UNIT capsule Take 400 Units by mouth daily.   No current facility-administered medications on file prior to visit.    Allergies  Allergen Reactions  . Erythromycin Nausea And Vomiting and Other (See Comments)    Diarrhea - more severe   PMHx:   Past Medical History:  Diagnosis Date  . Anxiety disorder   . Cancer ([place])    thyroid  . GERD (gastroesophageal reflux disease)   . History of thyroid cancer   .  Hyperlipidemia   . Hypertension   . Migraine headache   . Papilledema    bilateral  . Pseudotumor cerebri 04/19/2013  . Thyroid disease   . Vitamin D deficiency    Immunization History  Administered Date(s) Administered  . DTaP 12/14/1994  . Hepatitis B 07/15/1989  . PPD Test 07/18/2014, 08/04/2015  . Pneumococcal Polysaccharide-23 10/09/1998  . Tdap 05/03/2015   Past Surgical History:  Procedure Laterality Date  . ABDOMINAL HYSTERECTOMY  2005  . THYROIDECTOMY  04/25/11   FHx:    Reviewed / unchanged  SHx:    Reviewed / unchanged   Systems Review:  Constitutional: Denies fever, chills, wt changes, headaches, insomnia,  fatigue, night sweats, change in appetite. Eyes: Denies redness, blurred vision, diplopia, discharge, itchy, watery eyes.  ENT: Denies discharge, congestion, post nasal drip, epistaxis, sore throat, earache, hearing loss, dental pain, tinnitus, vertigo, sinus pain, snoring.  CV: Denies chest pain, palpitations, irregular heartbeat, syncope, dyspnea, diaphoresis, orthopnea, PND, claudication or edema. Respiratory: denies cough, dyspnea, DOE, pleurisy, hoarseness, laryngitis, wheezing.  Gastrointestinal: Denies dysphagia, odynophagia, heartburn, reflux, water brash, abdominal pain or cramps, nausea, vomiting, bloating, diarrhea, constipation, hematemesis, melena, hematochezia  or hemorrhoids. Genitourinary: Denies dysuria, frequency, urgency, nocturia, hesitancy, discharge, hematuria or flank pain. Musculoskeletal: Denies arthralgias, myalgias, stiffness, jt. swelling, pain, limping or strain/sprain.  Skin: Denies pruritus, rash, hives, warts, acne, eczema or change in skin lesion(s). Neuro: No weakness, tremor, incoordination, spasms, paresthesia or pain. Psychiatric: Denies confusion, memory loss or sensory loss. Endo: Denies change in weight, skin or hair change.  Heme/Lymph: No excessive bleeding, bruising or enlarged lymph nodes.  Physical Exam  BP (!) 152/90 Comment: 152/90-right/152/86-left  Pulse 72   Temp (!) 97.5 F (36.4 C)   Resp 16   Ht 5' 2.5" (1.588 m)   Wt 160 lb 3.2 oz (72.7 kg)   BMI 28.83 kg/m   Appears  well nourished, well groomed  and in no distress.  Eyes: PERRLA, EOMs, conjunctiva no swelling or erythema. Sinuses: No frontal/maxillary tenderness ENT/Mouth: EAC's clear, TM's nl w/o erythema, bulging. Nares clear w/o erythema, swelling, exudates. Oropharynx clear without erythema or exudates. Oral hygiene is good. Tongue normal, non obstructing. Hearing intact.  Neck: Supple. Thyroid not palpable. Car 2+/2+ without bruits, nodes or JVD. Chest: Respirations nl with  BS clear & equal w/o rales, rhonchi, wheezing or stridor.  Cor: Heart sounds normal w/ regular rate and rhythm without sig. murmurs, gallops, clicks or rubs. Peripheral pulses normal and equal  without edema.  Abdomen: Soft & bowel sounds normal. Non-tender w/o guarding, rebound, hernias, masses or organomegaly.  Lymphatics: Unremarkable.  Musculoskeletal: Full ROM all peripheral extremities, joint stability, 5/5 strength and normal gait.  Skin: Warm, dry without exposed rashes, lesions or ecchymosis apparent.  Neuro: Cranial nerves intact, reflexes equal bilaterally. Sensory-motor testing grossly intact. Tendon reflexes grossly intact.  Pysch: Alert & oriented x 3.  Insight and judgement nl & appropriate. No ideations.  Assessment and Plan:  1. Essential hypertension  - Continue medication, monitor blood pressure at home.  - Continue DASH diet.  Reminder to go to the ER if any CP,  SOB, nausea, dizziness, severe HA, changes vision/speech.   - CBC with Differential/Platelet - COMPLETE METABOLIC PANEL WITH GFR - Magnesium - TSH  2. Hyperlipidemia, mixed  - Continue diet/meds, exercise,& lifestyle modifications.  - Continue monitor periodic cholesterol/liver & renal functions   - Lipid panel - TSH  3. Prediabetes  - Continue diet, exercise, lifestyle modifications.  -  Monitor appropriate labs.  - Hemoglobin A1c - Insulin, random  4. Vitamin D deficiency  - Continue supplementation.  - VITAMIN D 25 Hydroxyl  5. Hypothyroidism  - TSH  6. Medication management - CBC with Differential/Platelet - COMPLETE METABOLIC PANEL WITH GFR - Magnesium - Lipid panel - TSH - Hemoglobin A1c - Insulin, random - VITAMIN D 25 Hydroxyl       Discussed  regular exercise, BP monitoring, weight control to achieve/maintain BMI less than 25 and discussed med and SE's. Recommended labs to assess and monitor clinical status with further disposition pending results of labs. Over 30 minutes  of exam, counseling, chart review was performed.

## 2018-04-06 NOTE — Patient Instructions (Signed)

## 2018-04-07 ENCOUNTER — Other Ambulatory Visit: Payer: Self-pay | Admitting: Internal Medicine

## 2018-04-07 DIAGNOSIS — N289 Disorder of kidney and ureter, unspecified: Secondary | ICD-10-CM

## 2018-04-07 LAB — LIPID PANEL
CHOLESTEROL: 172 mg/dL (ref <200)
HDL: 77 mg/dL (ref >50)
LDL CHOLESTEROL (CALC): 79 mg/dL
Non-HDL Cholesterol (Calc): 95 mg/dL (calc) (ref <130)
TRIGLYCERIDES: 77 mg/dL (ref <150)
Total CHOL/HDL Ratio: 2.2 (calc) (ref <5.0)

## 2018-04-07 LAB — COMPLETE METABOLIC PANEL WITH GFR
AG RATIO: 1.8 (calc) (ref 1.0–2.5)
ALBUMIN MSPROF: 4.4 g/dL (ref 3.6–5.1)
ALT: 24 U/L (ref 6–29)
AST: 19 U/L (ref 10–35)
Alkaline phosphatase (APISO): 59 U/L (ref 33–130)
BUN / CREAT RATIO: 13 (calc) (ref 6–22)
BUN: 14 mg/dL (ref 7–25)
CALCIUM: 9.2 mg/dL (ref 8.6–10.4)
CO2: 34 mmol/L — ABNORMAL HIGH (ref 20–32)
Chloride: 99 mmol/L (ref 98–110)
Creat: 1.05 mg/dL — ABNORMAL HIGH (ref 0.50–0.99)
GFR, EST AFRICAN AMERICAN: 66 mL/min/{1.73_m2} (ref >=60)
GFR, EST NON AFRICAN AMERICAN: 57 mL/min/{1.73_m2} — AB (ref >=60)
GLOBULIN: 2.5 g/dL (ref 1.9–3.7)
Glucose, Bld: 98 mg/dL (ref 65–99)
POTASSIUM: 4 mmol/L (ref 3.5–5.3)
Sodium: 138 mmol/L (ref 135–146)
TOTAL PROTEIN: 6.9 g/dL (ref 6.1–8.1)
Total Bilirubin: 0.3 mg/dL (ref 0.2–1.2)

## 2018-04-07 LAB — CBC WITH DIFFERENTIAL/PLATELET
BASOS PCT: 0.6 %
Basophils Absolute: 40 cells/uL (ref 0–200)
EOS ABS: 73 {cells}/uL (ref 15–500)
Eosinophils Relative: 1.1 %
HEMATOCRIT: 40.8 % (ref 35.0–45.0)
HEMOGLOBIN: 13.7 g/dL (ref 11.7–15.5)
LYMPHS ABS: 1828 {cells}/uL (ref 850–3900)
MCH: 31.6 pg (ref 27.0–33.0)
MCHC: 33.6 g/dL (ref 32.0–36.0)
MCV: 94 fL (ref 80.0–100.0)
MPV: 11.9 fL (ref 7.5–12.5)
Monocytes Relative: 6.4 %
NEUTROS ABS: 4237 {cells}/uL (ref 1500–7800)
Neutrophils Relative %: 64.2 %
PLATELETS: 262 10*3/uL (ref 140–400)
RBC: 4.34 10*6/uL (ref 3.80–5.10)
RDW: 12.5 % (ref 11.0–15.0)
Total Lymphocyte: 27.7 %
WBC: 6.6 10*3/uL (ref 3.8–10.8)
WBCMIX: 422 {cells}/uL (ref 200–950)

## 2018-04-07 LAB — HEMOGLOBIN A1C
Hgb A1c MFr Bld: 5.6 % of total Hgb (ref <5.7)
Mean Plasma Glucose: 114 (calc)
eAG (mmol/L): 6.3 (calc)

## 2018-04-07 LAB — VITAMIN D 25 HYDROXY (VIT D DEFICIENCY, FRACTURES): VIT D 25 HYDROXY: 64 ng/mL (ref 30–100)

## 2018-04-07 LAB — MAGNESIUM: Magnesium: 2.2 mg/dL (ref 1.5–2.5)

## 2018-04-07 LAB — TSH: TSH: 0.95 m[IU]/L (ref 0.40–4.50)

## 2018-04-07 LAB — INSULIN, RANDOM: Insulin: 17 u[IU]/mL (ref 2.0–19.6)

## 2018-05-08 ENCOUNTER — Other Ambulatory Visit: Payer: Self-pay | Admitting: Internal Medicine

## 2018-05-08 ENCOUNTER — Other Ambulatory Visit: Payer: 59

## 2018-05-08 DIAGNOSIS — N289 Disorder of kidney and ureter, unspecified: Secondary | ICD-10-CM

## 2018-05-08 DIAGNOSIS — I10 Essential (primary) hypertension: Secondary | ICD-10-CM

## 2018-05-08 LAB — BASIC METABOLIC PANEL WITH GFR
BUN/Creatinine Ratio: 10 (calc) (ref 6–22)
BUN: 11 mg/dL (ref 7–25)
CALCIUM: 9.6 mg/dL (ref 8.6–10.4)
CHLORIDE: 105 mmol/L (ref 98–110)
CO2: 32 mmol/L (ref 20–32)
Creat: 1.07 mg/dL — ABNORMAL HIGH (ref 0.50–0.99)
GFR, EST AFRICAN AMERICAN: 65 mL/min/{1.73_m2} (ref >=60)
GFR, EST NON AFRICAN AMERICAN: 56 mL/min/{1.73_m2} — AB (ref >=60)
Glucose, Bld: 85 mg/dL (ref 65–99)
Potassium: 5.2 mmol/L (ref 3.5–5.3)
Sodium: 145 mmol/L (ref 135–146)

## 2018-05-08 MED ORDER — HYDROCHLOROTHIAZIDE 25 MG PO TABS: ORAL_TABLET | ORAL | 3 refills | Status: DC

## 2018-05-08 MED ORDER — LISINOPRIL 20 MG PO TABS: ORAL_TABLET | ORAL | 3 refills | Status: DC

## 2018-05-09 ENCOUNTER — Other Ambulatory Visit: Payer: Self-pay | Admitting: Internal Medicine

## 2018-05-09 DIAGNOSIS — N289 Disorder of kidney and ureter, unspecified: Secondary | ICD-10-CM

## 2018-05-15 ENCOUNTER — Other Ambulatory Visit: Payer: Self-pay | Admitting: Internal Medicine

## 2018-06-10 DIAGNOSIS — Z780 Asymptomatic menopausal state: Secondary | ICD-10-CM | POA: Diagnosis not present

## 2018-06-10 DIAGNOSIS — Z79890 Hormone replacement therapy (postmenopausal): Secondary | ICD-10-CM | POA: Diagnosis not present

## 2018-06-10 DIAGNOSIS — Z6828 Body mass index (BMI) 28.0-28.9, adult: Secondary | ICD-10-CM | POA: Diagnosis not present

## 2018-06-10 DIAGNOSIS — Z130 Encounter for screening for diseases of the blood and blood-forming organs and certain disorders involving the immune mechanism: Secondary | ICD-10-CM | POA: Diagnosis not present

## 2018-06-10 DIAGNOSIS — N951 Menopausal and female climacteric states: Secondary | ICD-10-CM | POA: Diagnosis not present

## 2018-06-10 DIAGNOSIS — Z1389 Encounter for screening for other disorder: Secondary | ICD-10-CM | POA: Diagnosis not present

## 2018-06-10 DIAGNOSIS — Z01419 Encounter for gynecological examination (general) (routine) without abnormal findings: Secondary | ICD-10-CM | POA: Diagnosis not present

## 2018-06-10 DIAGNOSIS — N898 Other specified noninflammatory disorders of vagina: Secondary | ICD-10-CM | POA: Diagnosis not present

## 2018-06-10 MED FILL — NYSTATIN-TRIAMCINOLONE CRM: 100000-0.1 | 10 days supply | Qty: 15 | Fill #0

## 2018-06-10 MED FILL — ESTRADIOL 0.025 MG/24HR PTT: 0.025 | 84 days supply | Qty: 24 | Fill #0

## 2018-06-15 ENCOUNTER — Other Ambulatory Visit: Payer: Self-pay | Admitting: Internal Medicine

## 2018-06-15 MED ORDER — FUROSEMIDE 40 MG PO TABS: ORAL_TABLET | ORAL | 3 refills | Status: DC

## 2018-06-15 MED FILL — FUROSEMIDE 40 MG TAB: 40 | 90 days supply | Qty: 90 | Fill #0

## 2018-06-17 DIAGNOSIS — Z1231 Encounter for screening mammogram for malignant neoplasm of breast: Secondary | ICD-10-CM | POA: Diagnosis not present

## 2018-06-25 MED FILL — FLUTICASONE PROP 50 MCG SPR: 50 | 90 days supply | Qty: 48 | Fill #0

## 2018-06-25 MED FILL — buPROPion HCL ER (XL) 300 M: 300 | 90 days supply | Qty: 90 | Fill #1

## 2018-06-25 MED FILL — ATENOLOL 100 MG TABLET: 100 | 90 days supply | Qty: 90 | Fill #1

## 2018-07-03 DIAGNOSIS — E663 Overweight: Secondary | ICD-10-CM | POA: Insufficient documentation

## 2018-07-03 NOTE — Progress Notes (Signed)
FOLLOW UP  Assessment and Plan:   Hypertension Borderline elevated today, but with home values typically ranging at goal range; some fatigue, will defer increasing medication at this time Monitor blood pressure at home; patient to call if consistently greater than 130/80 Continue DASH diet - emphasized low sodium and increasing fluid intake  Reminder to go to the ER if any CP, SOB, nausea, dizziness, severe HA, changes vision/speech, left arm numbness and tingling and jaw pain.  Cholesterol Currently on zetia 10 mg, crestor three times weekly, at goal, continue to monitor  Continue low cholesterol diet and exercise; discussed increasing soluble fiber or adding supplement Check lipid panel.   History of Prediabetes Well controlled by lifestyle changes  Continue diet and exercise.  Defer A1C this visit to CPE next visit  Hypothyroidism continue medications the same reminded to take on an empty stomach 30-91mins before food.  check TSH level  Vitamin D Def/ osteoporosis prevention Continue supplementation Has been stable at goal at last few visits; defer vit D level  Continue diet and meds as discussed. Further disposition pending results of labs. Discussed med's effects and SE's.   Over 30 minutes of exam, counseling, chart review, and critical decision making was performed.   Future Appointments  Date Time Provider [place]  10/05/2018  3:00 PM Unk Pinto, MD [facility] None    ----------------------------------------------------------------------------------------------------------------------  HPI 62 y.o. female  presents for 3 month follow up on hypertension, cholesterol, history of prediabetes, post surgical hypothyroid (following thyroid cancer) and vitamin D deficiency.   Patient had been diagnosied in the past with Pseudotumor cerebri consequent of a HA evaluated LP with slightly elevated CSF OP and was treated with Diamox. Last Eval at [facility]  Ophthalmology discounted that Dx with a Dx/o anomalous Optic Discs and not indeed papilloedema. Has been released by neurology and only follow up if needed.   BMI is Body mass index is 28.76 kg/m., she has been working on diet and exercise but admits not doing  She reports she typically gets in 8000-10000 steps daily.  Wt Readings from Last 3 Encounters:  07/06/18 159 lb 12.8 oz (72.5 kg)  04/06/18 160 lb 3.2 oz (72.7 kg)  12/31/17 160 lb (72.6 kg)   Her blood pressure has been controlled at home, taking atenolol 50 mg twice daily, lasix 20 mg once daily, today their BP is BP: 140/76 She reports BP at home range from 120-140/70s.  She does not workout. She denies chest pain, shortness of breath, dizziness, but does endorse rare fatigue with increased dose of atenolol.    She is on cholesterol medication (zetia 10 mg daily, rosuvastatin 20 mg every other day) and denies myalgias. Her cholesterol is at goal. The cholesterol last visit was:   Lab Results  Component Value Date   CHOL 172 04/06/2018   HDL 77 04/06/2018   LDLCALC 79 04/06/2018   TRIG 77 04/06/2018   CHOLHDL 2.2 04/06/2018    She has been working on diet and exercise for history of prediabetes, and denies increased appetite, nausea, paresthesia of the feet, polydipsia, polyuria, visual disturbances and vomiting. Last A1C in the office was:  Lab Results  Component Value Date   HGBA1C 5.6 04/06/2018   She is on thyroid medication. Her medication was not changed last visit.   Lab Results  Component Value Date   TSH 0.95 04/06/2018  . Patient is on Vitamin D supplement and at goal:    Lab Results  Component Value Date  VD25OH 64 04/06/2018      Current Medications:  Current Outpatient Medications on File Prior to Visit  Medication Sig  . aspirin 81 MG tablet Take 81 mg by mouth daily.    Marland Kitchen atenolol (TENORMIN) 100 MG tablet TAKE 1/2 TO 1 TABLET BY MOUTH DAILY FOR BLOOD PRESSURE  . Biotin 5000 MCG TABS Take by mouth  daily.  Marland Kitchen buPROPion (WELLBUTRIN XL) 300 MG 24 hr tablet TAKE 1 TABLET BY MOUTH ONCE DAILY  . Cholecalciferol (VITAMIN D) 2000 units CAPS take 3 caps (6,000 units) / daily  . CINNAMON PO Take 1,000 mg by mouth 2 (two) times daily.   . Cyanocobalamin (VITAMIN B-12) 1000 MCG SUBL Place 1 tablet under the tongue daily.  Marland Kitchen ezetimibe (ZETIA) 10 MG tablet TAKE 1 TABLET BY MOUTH ONCE DAILY  . fexofenadine (ALLEGRA) 180 MG tablet Take 180 mg by mouth daily. Patient OTC Allegra PRN  . fluticasone (FLONASE) 50 MCG/ACT nasal spray PLACE 2 SPRAYS INTO BOTH NOSTRILS DAILY.  . furosemide (LASIX) 40 MG tablet Take 1 tablet daily for BP & Fluid Retention  . levothyroxine (SYNTHROID, LEVOTHROID) 112 MCG tablet TAKE 1 TABLET BY MOUTH ONCE DAILY FOR THYROID  . MINIVELLE 0.025 MG/24HR   . Multiple Vitamins-Minerals (HAIR/SKIN/NAILS PO) Take by mouth daily. 3 tablets daily  . Omega-3 Fatty Acids (FISH OIL) 1000 MG CAPS Take by mouth daily.  . rosuvastatin (CRESTOR) 40 MG tablet Take 1/2 to 1 tablet daily or as directed for Cholesterol  . vitamin E 400 UNIT capsule Take 400 Units by mouth daily.   No current facility-administered medications on file prior to visit.      Allergies:  Allergies  Allergen Reactions  . Erythromycin Nausea And Vomiting and Other (See Comments)    Diarrhea - more severe     Medical History:  Past Medical History:  Diagnosis Date  . Anxiety disorder   . Cancer ([place])    thyroid  . GERD (gastroesophageal reflux disease)   . History of thyroid cancer   . Hyperlipidemia   . Hypertension   . Migraine headache   . Papilledema    bilateral  . Pseudotumor cerebri 04/19/2013  . Thyroid disease   . Vitamin D deficiency    Family history- Reviewed and unchanged Social history- Reviewed and unchanged   Review of Systems:  Review of Systems  Constitutional: Negative for malaise/fatigue and weight loss.  HENT: Negative for hearing loss and tinnitus.   Eyes: Negative for  blurred vision and double vision.  Respiratory: Negative for cough, shortness of breath and wheezing.   Cardiovascular: Negative for chest pain, palpitations, orthopnea, claudication and leg swelling.  Gastrointestinal: Negative for abdominal pain, blood in stool, constipation, diarrhea, heartburn, melena, nausea and vomiting.  Genitourinary: Negative.   Musculoskeletal: Negative for joint pain and myalgias.  Skin: Negative for rash.  Neurological: Negative for dizziness, tingling, sensory change, weakness and headaches.  Endo/Heme/Allergies: Negative for polydipsia.  Psychiatric/Behavioral: Negative.   All other systems reviewed and are negative.     Physical Exam: BP 140/76   Pulse 60   Temp (!) 97.3 F (36.3 C)   Resp 16   Ht 5' 2.5" (1.588 m)   Wt 159 lb 12.8 oz (72.5 kg)   BMI 28.76 kg/m  Wt Readings from Last 3 Encounters:  07/06/18 159 lb 12.8 oz (72.5 kg)  04/06/18 160 lb 3.2 oz (72.7 kg)  12/31/17 160 lb (72.6 kg)   General Appearance: Well nourished, in no apparent distress.  Eyes: PERRLA, EOMs, conjunctiva no swelling or erythema Sinuses: No Frontal/maxillary tenderness ENT/Mouth: Ext aud canals clear, TMs without erythema, bulging. No erythema, swelling, or exudate on post pharynx.  Tonsils not swollen or erythematous. Hearing normal.  Neck: Supple, thyroid normal.  Respiratory: Respiratory effort normal, BS equal bilaterally without rales, rhonchi, wheezing or stridor.  Cardio: RRR with no MRGs. Brisk peripheral pulses without edema.  Abdomen: Soft, + BS.  Non tender, no guarding, rebound, hernias, masses. Lymphatics: Non tender without lymphadenopathy.  Musculoskeletal: Full ROM, 5/5 strength, Normal gait Skin: Warm, dry without rashes, lesions, ecchymosis.  Neuro: Cranial nerves intact. No cerebellar symptoms.  Psych: Awake and oriented X 3, normal affect, Insight and Judgment appropriate.    Izora Ribas, NP 4:47 PM [facility]

## 2018-07-06 ENCOUNTER — Encounter: Payer: Self-pay | Admitting: Adult Health

## 2018-07-06 ENCOUNTER — Ambulatory Visit: Payer: 59 | Admitting: Adult Health

## 2018-07-06 VITALS — BP 140/76 | HR 60 | Temp 97.3°F | Resp 16 | Ht 62.5 in | Wt 159.8 lb

## 2018-07-06 DIAGNOSIS — Z87898 Personal history of other specified conditions: Secondary | ICD-10-CM | POA: Diagnosis not present

## 2018-07-06 DIAGNOSIS — E782 Mixed hyperlipidemia: Secondary | ICD-10-CM | POA: Diagnosis not present

## 2018-07-06 DIAGNOSIS — Z79899 Other long term (current) drug therapy: Secondary | ICD-10-CM

## 2018-07-06 DIAGNOSIS — E559 Vitamin D deficiency, unspecified: Secondary | ICD-10-CM | POA: Diagnosis not present

## 2018-07-06 DIAGNOSIS — E890 Postprocedural hypothyroidism: Secondary | ICD-10-CM

## 2018-07-06 DIAGNOSIS — E663 Overweight: Secondary | ICD-10-CM

## 2018-07-06 DIAGNOSIS — I10 Essential (primary) hypertension: Secondary | ICD-10-CM

## 2018-07-06 MED FILL — EZETIMIBE 10 MG TABS: 10 | 90 days supply | Qty: 90 | Fill #1

## 2018-07-06 NOTE — Patient Instructions (Addendum)
Goals    . Blood Pressure < 130/80    . Exercise 150 min/wk Moderate Activity      Know what a healthy weight is for you (roughly BMI <25) and aim to maintain this  Aim for 7+ servings of fruits and vegetables daily  65-80+ fluid ounces of water or unsweet tea for healthy kidneys  Limit to max 1 drink of alcohol per day; avoid smoking/tobacco  Limit animal fats in diet for cholesterol and heart health - choose grass fed whenever available  Avoid highly processed foods, and foods high in saturated/trans fats  Aim for low stress - take time to unwind and care for your mental health  Aim for 150 min of moderate intensity exercise weekly for heart health, and weights twice weekly for bone health  Aim for 7-9 hours of sleep daily    HYPERTENSION INFORMATION  Monitor your blood pressure at home, please keep a record and bring that in with you to your next office visit.   Go to the ER if any CP, SOB, nausea, dizziness, severe HA, changes vision/speech  Your most recent BP: BP: 140/76   Take your medications faithfully as instructed. Maintain a healthy weight. Get at least 150 minutes of aerobic exercise per week. Minimize salt intake. Minimize alcohol intake  DASH Eating Plan DASH stands for "Dietary Approaches to Stop Hypertension." The DASH eating plan is a healthy eating plan that has been shown to reduce high blood pressure (hypertension). Additional health benefits may include reducing the risk of type 2 diabetes mellitus, heart disease, and stroke. The DASH eating plan may also help with weight loss. WHAT DO I NEED TO KNOW ABOUT THE DASH EATING PLAN? For the DASH eating plan, you will follow these general guidelines:  Choose foods with a percent daily value for sodium of less than 5% (as listed on the food label).  Use salt-free seasonings or herbs instead of table salt or sea salt.  Check with your health care provider or pharmacist before using salt  substitutes.  Eat lower-sodium products, often labeled as "lower sodium" or "no salt added."  Eat fresh foods.  Eat more vegetables, fruits, and low-fat dairy products.  Choose whole grains. Look for the word "whole" as the first word in the ingredient list.  Choose fish and skinless chicken or [place] more often than red meat. Limit fish, poultry, and meat to 6 oz (170 g) each day.  Limit sweets, desserts, sugars, and sugary drinks.  Choose heart-healthy fats.  Limit cheese to 1 oz (28 g) per day.  Eat more home-cooked food and less restaurant, buffet, and fast food.  Limit fried foods.  Cook foods using methods other than frying.  Limit canned vegetables. If you do use them, rinse them well to decrease the sodium.  When eating at a restaurant, ask that your food be prepared with less salt, or no salt if possible. WHAT FOODS CAN I EAT? Seek help from a dietitian for individual calorie needs. Grains Whole grain or whole wheat bread. Brown rice. Whole grain or whole wheat pasta. Quinoa, bulgur, and whole grain cereals. Low-sodium cereals. Corn or whole wheat flour tortillas. Whole grain cornbread. Whole grain crackers. Low-sodium crackers. Vegetables Fresh or frozen vegetables (raw, steamed, roasted, or grilled). Low-sodium or reduced-sodium tomato and vegetable juices. Low-sodium or reduced-sodium tomato sauce and paste. Low-sodium or reduced-sodium canned vegetables.  Fruits All fresh, canned (in natural juice), or frozen fruits. Meat and Other Protein Products Ground beef (85% or  leaner), grass-fed beef, or beef trimmed of fat. Skinless chicken or [place]. Ground chicken or [place]. Pork trimmed of fat. All fish and seafood. Eggs. Dried beans, peas, or lentils. Unsalted nuts and seeds. Unsalted canned beans. Dairy Low-fat dairy products, such as skim or 1% milk, 2% or reduced-fat cheeses, low-fat ricotta or cottage cheese, or plain low-fat yogurt. Low-sodium or reduced-sodium  cheeses. Fats and Oils Tub margarines without trans fats. Light or reduced-fat mayonnaise and salad dressings (reduced sodium). Avocado. Safflower, olive, or canola oils. Natural peanut or almond butter. Other Unsalted popcorn and pretzels. The items listed above may not be a complete list of recommended foods or beverages. Contact your dietitian for more options. WHAT FOODS ARE NOT RECOMMENDED? Grains White bread. White pasta. White rice. Refined cornbread. Bagels and croissants. Crackers that contain trans fat. Vegetables Creamed or fried vegetables. Vegetables in a cheese sauce. Regular canned vegetables. Regular canned tomato sauce and paste. Regular tomato and vegetable juices. Fruits Dried fruits. Canned fruit in light or heavy syrup. Fruit juice. Meat and Other Protein Products Fatty cuts of meat. Ribs, chicken wings, bacon, sausage, bologna, salami, chitterlings, fatback, hot dogs, bratwurst, and packaged luncheon meats. Salted nuts and seeds. Canned beans with salt. Dairy Whole or 2% milk, cream, half-and-half, and cream cheese. Whole-fat or sweetened yogurt. Full-fat cheeses or blue cheese. Nondairy creamers and whipped toppings. Processed cheese, cheese spreads, or cheese curds. Condiments Onion and garlic salt, seasoned salt, table salt, and sea salt. Canned and packaged gravies. Worcestershire sauce. Tartar sauce. Barbecue sauce. Teriyaki sauce. Soy sauce, including reduced sodium. Steak sauce. Fish sauce. Oyster sauce. Cocktail sauce. Horseradish. Ketchup and mustard. Meat flavorings and tenderizers. Bouillon cubes. Hot sauce. Tabasco sauce. Marinades. Taco seasonings. Relishes. Fats and Oils Butter, stick margarine, lard, shortening, ghee, and bacon fat. Coconut, palm kernel, or palm oils. Regular salad dressings. Other Pickles and olives. Salted popcorn and pretzels. The items listed above may not be a complete list of foods and beverages to avoid. Contact your dietitian for  more information. WHERE CAN I FIND MORE INFORMATION? National Heart, Lung, and Blood Institute: travelstabloid.com Document Released: 06/20/2011 Document Revised: 11/15/2013 Document Reviewed: 05/05/2013 [facility] Patient Information 2015 [place], [place]. This information is not intended to replace advice given to you by your health care provider. Make sure you discuss any questions you have with your health care provider.

## 2018-07-07 LAB — CBC WITH DIFFERENTIAL/PLATELET
Absolute Monocytes: 422 cells/uL (ref 200–950)
Basophils Absolute: 19 cells/uL (ref 0–200)
Basophils Relative: 0.3 %
Eosinophils Absolute: 82 cells/uL (ref 15–500)
Eosinophils Relative: 1.3 %
HCT: 42.1 % (ref 35.0–45.0)
Hemoglobin: 14.1 g/dL (ref 11.7–15.5)
Lymphs Abs: 1676 cells/uL (ref 850–3900)
MCH: 32.2 pg (ref 27.0–33.0)
MCHC: 33.5 g/dL (ref 32.0–36.0)
MCV: 96.1 fL (ref 80.0–100.0)
MPV: 12.2 fL (ref 7.5–12.5)
Monocytes Relative: 6.7 %
Neutro Abs: 4101 cells/uL (ref 1500–7800)
Neutrophils Relative %: 65.1 %
Platelets: 263 10*3/uL (ref 140–400)
RBC: 4.38 10*6/uL (ref 3.80–5.10)
RDW: 12.4 % (ref 11.0–15.0)
Total Lymphocyte: 26.6 %
WBC: 6.3 10*3/uL (ref 3.8–10.8)

## 2018-07-07 LAB — LIPID PANEL
CHOLESTEROL: 175 mg/dL (ref <200)
HDL: 76 mg/dL (ref >50)
LDL Cholesterol (Calc): 80 mg/dL (calc)
Non-HDL Cholesterol (Calc): 99 mg/dL (calc) (ref <130)
Total CHOL/HDL Ratio: 2.3 (calc) (ref <5.0)
Triglycerides: 106 mg/dL (ref <150)

## 2018-07-07 LAB — COMPLETE METABOLIC PANEL WITH GFR
AG Ratio: 1.7 (calc) (ref 1.0–2.5)
ALKALINE PHOSPHATASE (APISO): 66 U/L (ref 33–130)
ALT: 30 U/L — ABNORMAL HIGH (ref 6–29)
AST: 18 U/L (ref 10–35)
Albumin: 4.3 g/dL (ref 3.6–5.1)
BILIRUBIN TOTAL: 0.4 mg/dL (ref 0.2–1.2)
BUN: 18 mg/dL (ref 7–25)
CO2: 33 mmol/L — ABNORMAL HIGH (ref 20–32)
Calcium: 9.6 mg/dL (ref 8.6–10.4)
Chloride: 105 mmol/L (ref 98–110)
Creat: 0.92 mg/dL (ref 0.50–0.99)
GFR, Est African American: 78 mL/min/{1.73_m2} (ref >=60)
GFR, Est Non African American: 67 mL/min/{1.73_m2} (ref >=60)
Globulin: 2.6 g/dL (calc) (ref 1.9–3.7)
Glucose, Bld: 81 mg/dL (ref 65–99)
Potassium: 3.8 mmol/L (ref 3.5–5.3)
Sodium: 143 mmol/L (ref 135–146)
Total Protein: 6.9 g/dL (ref 6.1–8.1)

## 2018-07-07 LAB — MAGNESIUM: MAGNESIUM: 2.1 mg/dL (ref 1.5–2.5)

## 2018-07-07 LAB — TSH: TSH: 1.64 mIU/L (ref 0.40–4.50)

## 2018-08-07 MED FILL — LEVOTHYROXINE 112 MCG TAB: 112 | 90 days supply | Qty: 90 | Fill #2

## 2018-09-24 ENCOUNTER — Other Ambulatory Visit: Payer: Self-pay | Admitting: Adult Health

## 2018-09-24 MED FILL — ATENOLOL 100 MG TABLET: 100 | 90 days supply | Qty: 90 | Fill #2

## 2018-09-24 MED FILL — buPROPion HCL ER (XL) 300 M: 300 | 90 days supply | Qty: 90 | Fill #0

## 2018-10-04 ENCOUNTER — Encounter: Payer: Self-pay | Admitting: Internal Medicine

## 2018-10-04 NOTE — Progress Notes (Signed)
[facility] Unk Pinto, M.D.     Uvaldo Bristle. Silverio Lay, P.A.-C Liane Comber, [place] [address] [place] [address] Telephone [phone] Telefax [phone] Annual Screening/Preventative Visit & Comprehensive Evaluation &  Examination     This very nice 63 y.o. DBF presents for a Screening /Preventative Visit & comprehensive evaluation and management of multiple medical co-morbidities.  Patient has been followed for HTN, HLD, Prediabetes, Hypothyroidism  and Vitamin D Deficiency.     (copy) Patient had been dx'd in the past w/ Pseudotumor cerebri consequent of a HA evaluated & LP with sl elevated CSF OP (275 mm)  & was treated w/Diamox. Last Eval at [facility] discounted that Dx with a Dx/o anomalous Optic Discs and NOT PAPILLEDEMA.           HTN predates  circa 1992. Patient's BP has been controlled at home and patient denies any cardiac symptoms as chest pain, palpitations, shortness of breath, dizziness or ankle swelling. Today's BP is sl elevated at 142/90. Patient reports recent BP's have been elevated about 150/90.       Patient's hyperlipidemia is controlled with diet and medications. Patient denies myalgias or other medication SE's. Last lipids were at goal, but since last ALT was borderline elevated at 30 (Nl <30) she stopped her Rosuvastatin:; Lab Results  Component Value Date   CHOL 175 07/06/2018   HDL 76 07/06/2018   LDLCALC 80 07/06/2018   TRIG 106 07/06/2018   CHOLHDL 2.3 07/06/2018      Patient has hx/o prediabetes (A1c 5.8% / 2011)  and patient denies reactive hypoglycemic symptoms, visual blurring, diabetic polys or paresthesias. Last A1c was Normal & at goal: Lab Results  Component Value Date   HGBA1C 5.6 04/06/2018      In 2012, patient had a subtotal thyroidectomy for a MNG and was found to have a small foci of Thyroid cancer. She has been on supressive therapy  since.      Finally, patient has history of Vitamin D Deficiency and last Vitamin D was at goal: Lab Results  Component Value Date   VD25OH 64 04/06/2018   Current Outpatient Medications on File Prior to Visit  Medication Sig  . aspirin 81 MG tablet Take 81 mg by mouth daily.    Marland Kitchen atenolol (TENORMIN) 100 MG tablet TAKE 1/2 TO 1 TABLET BY MOUTH DAILY FOR BLOOD PRESSURE  . Biotin 5000 MCG TABS Take by mouth daily.  Marland Kitchen buPROPion (WELLBUTRIN XL) 300 MG 24 hr tablet TAKE 1 TABLET BY MOUTH ONCE DAILY  . Cholecalciferol (VITAMIN D) 2000 units CAPS take 3 caps (6,000 units) / daily  . CINNAMON PO Take 1,000 mg by mouth 2 (two) times daily.   . Cyanocobalamin (VITAMIN B-12) 1000 MCG SUBL Place 1 tablet under the tongue daily.  Marland Kitchen ezetimibe (ZETIA) 10 MG tablet TAKE 1 TABLET BY MOUTH ONCE DAILY  . fexofenadine (ALLEGRA) 180 MG tablet Take 180 mg by mouth daily. Patient OTC Allegra PRN  . fluticasone (FLONASE) 50 MCG/ACT nasal spray PLACE 2 SPRAYS INTO BOTH NOSTRILS DAILY.  . furosemide (LASIX) 40 MG tablet Take 1 tablet daily for BP & Fluid Retention  . levothyroxine (SYNTHROID, LEVOTHROID) 112 MCG tablet TAKE 1 TABLET BY MOUTH ONCE DAILY FOR THYROID  . MINIVELLE 0.025 MG/24HR   . Multiple Vitamins-Minerals (HAIR/SKIN/NAILS PO) Take by mouth daily. 3 tablets daily  . Omega-3 Fatty Acids (FISH OIL) 1000 MG CAPS Take by mouth daily.  Marland Kitchen  vitamin E 400 UNIT capsule Take 400 Units by mouth daily.  . rosuvastatin (CRESTOR) 40 MG tablet Take 1/2 to 1 tablet daily or as directed for Cholesterol (Patient not taking: Reported on 10/05/2018)   No current facility-administered medications on file prior to visit.    Allergies  Allergen Reactions  . Erythromycin Nausea And Vomiting and Other (See Comments)    Diarrhea - more severe   Past Medical History:  Diagnosis Date  . Anxiety disorder   . Cancer ([place])    thyroid  . GERD (gastroesophageal reflux disease)   . History of thyroid cancer   .  Hyperlipidemia   . Hypertension   . Migraine headache   . Pseudopapilledema of both optic discs    bilateral  . Vitamin D deficiency    Health Maintenance  Topic Date Due  . PAP SMEAR-Modifier  04/18/2018  . MAMMOGRAM  06/02/2019  . TETANUS/TDAP  05/02/2025  . COLONOSCOPY  08/12/2027  . INFLUENZA VACCINE  Completed  . Hepatitis C Screening  Completed  . HIV Screening  Completed   Immunization History  Administered Date(s) Administered  . DTaP 12/14/1994  . Hepatitis B 07/15/1989  . PPD Test 07/18/2014, 08/04/2015  . Pneumococcal Polysaccharide-23 10/09/1998  . Tdap 05/03/2015   Last Colon - 08/11/2017 - Dr Collene Mares - recommended 10 yr f/u due Feb 2029   Last MGM - 05/2018 - Dr Raliegh Ip Richardson's office  Past Surgical History:  Procedure Laterality Date  . ABDOMINAL HYSTERECTOMY  2005  . THYROIDECTOMY  04/25/11   Family History  Problem Relation Age of Onset  . Diabetes Mother   . Heart disease Mother   . Hypertension Mother   . Hyperlipidemia Mother   . COPD Mother   . Depression Father   . Suicidality Father   . Stroke Brother   . Heart disease Brother   . Asthma Son   . Cancer Maternal Aunt        lung   Social History   Tobacco Use  . Smoking status: Light Tobacco Smoker    Packs/day: 0.20    Types: Cigarettes  . Smokeless tobacco: Never Used  . Tobacco comment: 1-2 cigarettes per day  Substance Use Topics  . Alcohol use: Yes    Comment: weekends  . Drug use: No    ROS Constitutional: Denies fever, chills, weight loss/gain, headaches, insomnia,  night sweats, and change in appetite. Does c/o fatigue. Eyes: Denies redness, blurred vision, diplopia, discharge, itchy, watery eyes.  ENT: Denies discharge, congestion, post nasal drip, epistaxis, sore throat, earache, hearing loss, dental pain, Tinnitus, Vertigo, Sinus pain, snoring.  Cardio: Denies chest pain, palpitations, irregular heartbeat, syncope, dyspnea, diaphoresis, orthopnea, PND, claudication,  edema Respiratory: denies cough, dyspnea, DOE, pleurisy, hoarseness, laryngitis, wheezing.  Gastrointestinal: Denies dysphagia, heartburn, reflux, water brash, pain, cramps, nausea, vomiting, bloating, diarrhea, constipation, hematemesis, melena, hematochezia, jaundice, hemorrhoids Genitourinary: Denies dysuria, frequency, urgency, nocturia, hesitancy, discharge, hematuria, flank pain Breast: Breast lumps, nipple discharge, bleeding.  Musculoskeletal: Denies arthralgia, myalgia, stiffness, Jt. Swelling, pain, limp, and strain/sprain. Denies falls. Skin: Denies puritis, rash, hives, warts, acne, eczema, changing in skin lesion Neuro: No weakness, tremor, incoordination, spasms, paresthesia, pain Psychiatric: Denies confusion, memory loss, sensory loss. Denies Depression. Endocrine: Denies change in weight, skin, hair change, nocturia, and paresthesia, diabetic polys, visual blurring, hyper / hypo glycemic episodes.  Heme/Lymph: No excessive bleeding, bruising, enlarged lymph nodes.  Physical Exam  BP (!) 142/90   Pulse 60   Temp 97.6 F (36.4  C)   Resp 16   Ht 5' 2.5" (1.588 m)   Wt 162 lb 6.4 oz (73.7 kg)   BMI 29.23 kg/m   General Appearance: Well nourished, well groomed and in no apparent distress.  Eyes: PERRLA, EOMs, conjunctiva no swelling or erythema, normal fundi and vessels. Sinuses: No frontal/maxillary tenderness ENT/Mouth: EACs patent / TMs  nl. Nares clear without erythema, swelling, mucoid exudates. Oral hygiene is good. No erythema, swelling, or exudate. Tongue normal, non-obstructing. Tonsils not swollen or erythematous. Hearing normal.  Neck: Supple, thyroid not palpable. No bruits, nodes or JVD. Respiratory: Respiratory effort normal.  BS equal and clear bilateral without rales, rhonci, wheezing or stridor. Cardio: Heart sounds are normal with regular rate and rhythm and no murmurs, rubs or gallops. Peripheral pulses are normal and equal bilaterally without edema. No  aortic or femoral bruits. Chest: symmetric with normal excursions and percussion. Breasts: Symmetric, without lumps, nipple discharge, retractions, or fibrocystic changes.  Abdomen: Flat, soft with bowel sounds active. Nontender, no guarding, rebound, hernias, masses, or organomegaly.  Lymphatics: Non tender without lymphadenopathy.  Genitourinary:  Musculoskeletal: Full ROM all peripheral extremities, joint stability, 5/5 strength, and normal gait. Skin: Warm and dry without rashes, lesions, cyanosis, clubbing or  ecchymosis.  Neuro: Cranial nerves intact, reflexes equal bilaterally. Normal muscle tone, no cerebellar symptoms. Sensation intact.  Pysch: Alert and oriented X 3, normal affect, Insight and Judgment appropriate.   Assessment and Plan  1. Annual Preventative Screening Examination  2. Essential hypertension  - CBC with Differential/Platelet - COMPLETE METABOLIC PANEL WITH GFR - Magnesium - TSH - EKG 12-Lead - [place], RETROPERITNL ABD,  LTD - Urinalysis, Routine w reflex microscopic - Microalbumin / creatinine urine ratio - olmesartan (BENICAR) 40 MG tablet; Take 1 tablet Daily for BP  Dispense: 90 tablet; Refill: 3  3. Hyperlipidemia, mixed  - Lipid panel - TSH - EKG 12-Lead - [place], RETROPERITNL ABD,  LTD  4. Abnormal glucose  - Hemoglobin A1c - Insulin, random - EKG 12-Lead - [place], RETROPERITNL ABD,  LTD  5. Vitamin D deficiency  - VITAMIN D 25 Hydroxyl  6. Prediabetes  - Hemoglobin A1c - Insulin, random - EKG 12-Lead - [place], RETROPERITNL ABD,  LTD  7. Hypothyroidism  - TSH  8. Screening for colorectal cancer  - POC Hemoccult Bld/Stl  9. Screening for ischemic heart disease  - EKG 12-Lead  10. FHx: heart disease  - EKG 12-Lead - [place], RETROPERITNL ABD,  LTD  11. Smoker  - EKG 12-Lead - [place], RETROPERITNL ABD,  LTD  12. Screening for AAA (aortic abdominal aneurysm)  - [place], RETROPERITNL ABD,  LTD  13. Screening-pulmonary TB  - TB Skin  Test  14. Fatigue  - Iron,Total/Total Iron Binding Cap - Vitamin B12  15. Medication management  - CBC with Differential/Platelet - COMPLETE METABOLIC PANEL WITH GFR - Magnesium - Lipid panel - TSH - Hemoglobin A1c - Insulin, random - VITAMIN D 25 Hydroxyl - Urinalysis, Routine w reflex microscopic - Microalbumin / creatinine urine ratio         Patient was counseled in prudent diet to achieve/maintain BMI less than 25 for weight control, BP monitoring, regular exercise and medications. Discussed med's effects and SE's. Screening labs and tests as requested with regular follow-up as recommended. Over 40 minutes of exam, counseling, chart review and high complex critical decision making was performed.

## 2018-10-04 NOTE — Patient Instructions (Addendum)

## 2018-10-05 ENCOUNTER — Other Ambulatory Visit: Payer: Self-pay

## 2018-10-05 ENCOUNTER — Ambulatory Visit (INDEPENDENT_AMBULATORY_CARE_PROVIDER_SITE_OTHER): Payer: 59 | Admitting: Internal Medicine

## 2018-10-05 VITALS — BP 142/90 | HR 60 | Temp 97.6°F | Resp 16 | Ht 62.5 in | Wt 162.4 lb

## 2018-10-05 DIAGNOSIS — R7303 Prediabetes: Secondary | ICD-10-CM | POA: Diagnosis not present

## 2018-10-05 DIAGNOSIS — I10 Essential (primary) hypertension: Secondary | ICD-10-CM

## 2018-10-05 DIAGNOSIS — E039 Hypothyroidism, unspecified: Secondary | ICD-10-CM

## 2018-10-05 DIAGNOSIS — E782 Mixed hyperlipidemia: Secondary | ICD-10-CM | POA: Diagnosis not present

## 2018-10-05 DIAGNOSIS — E559 Vitamin D deficiency, unspecified: Secondary | ICD-10-CM | POA: Diagnosis not present

## 2018-10-05 DIAGNOSIS — F17200 Nicotine dependence, unspecified, uncomplicated: Secondary | ICD-10-CM

## 2018-10-05 DIAGNOSIS — Z1211 Encounter for screening for malignant neoplasm of colon: Secondary | ICD-10-CM

## 2018-10-05 DIAGNOSIS — Z0001 Encounter for general adult medical examination with abnormal findings: Secondary | ICD-10-CM

## 2018-10-05 DIAGNOSIS — Z8249 Family history of ischemic heart disease and other diseases of the circulatory system: Secondary | ICD-10-CM

## 2018-10-05 DIAGNOSIS — R7309 Other abnormal glucose: Secondary | ICD-10-CM

## 2018-10-05 DIAGNOSIS — Z1212 Encounter for screening for malignant neoplasm of rectum: Secondary | ICD-10-CM

## 2018-10-05 DIAGNOSIS — Z0000 Encounter for general adult medical examination without abnormal findings: Secondary | ICD-10-CM

## 2018-10-05 DIAGNOSIS — Z111 Encounter for screening for respiratory tuberculosis: Secondary | ICD-10-CM | POA: Diagnosis not present

## 2018-10-05 DIAGNOSIS — Z136 Encounter for screening for cardiovascular disorders: Secondary | ICD-10-CM

## 2018-10-05 DIAGNOSIS — Z79899 Other long term (current) drug therapy: Secondary | ICD-10-CM | POA: Diagnosis not present

## 2018-10-05 DIAGNOSIS — R5383 Other fatigue: Secondary | ICD-10-CM

## 2018-10-05 MED ORDER — OLMESARTAN MEDOXOMIL 40 MG PO TABS: ORAL_TABLET | ORAL | 3 refills | Status: DC

## 2018-10-06 LAB — CBC WITH DIFFERENTIAL/PLATELET
Absolute Monocytes: 369 cells/uL (ref 200–950)
BASOS ABS: 28 {cells}/uL (ref 0–200)
Basophils Relative: 0.5 %
EOS PCT: 1.5 %
Eosinophils Absolute: 83 cells/uL (ref 15–500)
HCT: 42.1 % (ref 35.0–45.0)
Hemoglobin: 14.2 g/dL (ref 11.7–15.5)
Lymphs Abs: 1403 cells/uL (ref 850–3900)
MCH: 31.9 pg (ref 27.0–33.0)
MCHC: 33.7 g/dL (ref 32.0–36.0)
MCV: 94.6 fL (ref 80.0–100.0)
MPV: 12 fL (ref 7.5–12.5)
Monocytes Relative: 6.7 %
Neutro Abs: 3619 cells/uL (ref 1500–7800)
Neutrophils Relative %: 65.8 %
Platelets: 255 10*3/uL (ref 140–400)
RBC: 4.45 10*6/uL (ref 3.80–5.10)
RDW: 12.6 % (ref 11.0–15.0)
Total Lymphocyte: 25.5 %
WBC: 5.5 10*3/uL (ref 3.8–10.8)

## 2018-10-06 LAB — COMPLETE METABOLIC PANEL WITH GFR
AG Ratio: 1.4 (calc) (ref 1.0–2.5)
ALT: 23 U/L (ref 6–29)
AST: 20 U/L (ref 10–35)
Albumin: 4.2 g/dL (ref 3.6–5.1)
Alkaline phosphatase (APISO): 68 U/L (ref 37–153)
BUN/Creatinine Ratio: 13 (calc) (ref 6–22)
BUN: 14 mg/dL (ref 7–25)
CO2: 30 mmol/L (ref 20–32)
Calcium: 9.4 mg/dL (ref 8.6–10.4)
Chloride: 102 mmol/L (ref 98–110)
Creat: 1.04 mg/dL — ABNORMAL HIGH (ref 0.50–0.99)
GFR, Est African American: 67 mL/min/{1.73_m2} (ref >=60)
GFR, Est Non African American: 58 mL/min/{1.73_m2} — ABNORMAL LOW (ref >=60)
Globulin: 3 g/dL (calc) (ref 1.9–3.7)
Glucose, Bld: 91 mg/dL (ref 65–99)
POTASSIUM: 4.2 mmol/L (ref 3.5–5.3)
Sodium: 140 mmol/L (ref 135–146)
Total Bilirubin: 0.4 mg/dL (ref 0.2–1.2)
Total Protein: 7.2 g/dL (ref 6.1–8.1)

## 2018-10-06 LAB — URINALYSIS, ROUTINE W REFLEX MICROSCOPIC
Bilirubin Urine: NEGATIVE
Glucose, UA: NEGATIVE
Hgb urine dipstick: NEGATIVE
Ketones, ur: NEGATIVE
Leukocytes,Ua: NEGATIVE
Nitrite: NEGATIVE
Protein, ur: NEGATIVE
Specific Gravity, Urine: 1.023 (ref 1.001–1.03)
pH: 7 (ref 5.0–8.0)

## 2018-10-06 LAB — LIPID PANEL
CHOLESTEROL: 236 mg/dL — AB (ref <200)
HDL: 76 mg/dL (ref >=50)
LDL Cholesterol (Calc): 134 mg/dL (calc) — ABNORMAL HIGH
Non-HDL Cholesterol (Calc): 160 mg/dL (calc) — ABNORMAL HIGH (ref <130)
Total CHOL/HDL Ratio: 3.1 (calc) (ref <5.0)
Triglycerides: 132 mg/dL (ref <150)

## 2018-10-06 LAB — INSULIN, RANDOM: Insulin: 7.7 u[IU]/mL

## 2018-10-06 LAB — MICROALBUMIN / CREATININE URINE RATIO
Creatinine, Urine: 101 mg/dL (ref 20–275)
Microalb Creat Ratio: 2 mcg/mg creat (ref <30)
Microalb, Ur: 0.2 mg/dL

## 2018-10-06 LAB — IRON, TOTAL/TOTAL IRON BINDING CAP
%SAT: 28 % (calc) (ref 16–45)
Iron: 95 ug/dL (ref 45–160)
TIBC: 334 mcg/dL (calc) (ref 250–450)

## 2018-10-06 LAB — MAGNESIUM: Magnesium: 1.9 mg/dL (ref 1.5–2.5)

## 2018-10-06 LAB — VITAMIN B12: Vitamin B-12: >2000 pg/mL — ABNORMAL HIGH (ref 200–1100)

## 2018-10-06 LAB — VITAMIN D 25 HYDROXY (VIT D DEFICIENCY, FRACTURES): Vit D, 25-Hydroxy: 68 ng/mL (ref 30–100)

## 2018-10-06 LAB — HEMOGLOBIN A1C
Hgb A1c MFr Bld: 5.7 % of total Hgb — ABNORMAL HIGH (ref <5.7)
MEAN PLASMA GLUCOSE: 117 (calc)
eAG (mmol/L): 6.5 (calc)

## 2018-10-06 LAB — TSH: TSH: 1.86 mIU/L (ref 0.40–4.50)

## 2018-10-06 MED FILL — OLMESARTAN MEDOXOMIL 40 MG: 40 | 90 days supply | Qty: 90 | Fill #0

## 2018-10-20 ENCOUNTER — Other Ambulatory Visit: Payer: Self-pay | Admitting: Adult Health

## 2018-10-20 ENCOUNTER — Other Ambulatory Visit: Payer: Self-pay | Admitting: Internal Medicine

## 2018-10-20 MED FILL — EZETIMIBE 10 MG TABS: 10 | 90 days supply | Qty: 90 | Fill #0

## 2018-10-20 MED FILL — ROSUVASTATIN CALCIUM 40 MG: 40 | 90 days supply | Qty: 90 | Fill #0

## 2018-11-17 MED FILL — FUROSEMIDE 40 MG TAB: 40 | 90 days supply | Qty: 90 | Fill #1

## 2018-11-17 MED FILL — LEVOTHYROXINE 112 MCG TAB: 112 | 90 days supply | Qty: 90 | Fill #3

## 2018-11-30 DIAGNOSIS — M25511 Pain in right shoulder: Secondary | ICD-10-CM | POA: Diagnosis not present

## 2018-11-30 DIAGNOSIS — M19011 Primary osteoarthritis, right shoulder: Secondary | ICD-10-CM | POA: Diagnosis not present

## 2018-11-30 MED FILL — DICLOFENAC SODIUM 1 % GEL: 1 | 30 days supply | Qty: 500 | Fill #0

## 2018-12-02 MED FILL — ESTRADIOL 0.025 MG/24HR PTT: 0.025 | 84 days supply | Qty: 24 | Fill #1

## 2018-12-09 DIAGNOSIS — H524 Presbyopia: Secondary | ICD-10-CM | POA: Diagnosis not present

## 2018-12-09 DIAGNOSIS — H5203 Hypermetropia, bilateral: Secondary | ICD-10-CM | POA: Diagnosis not present

## 2018-12-09 DIAGNOSIS — H52223 Regular astigmatism, bilateral: Secondary | ICD-10-CM | POA: Diagnosis not present

## 2018-12-29 MED FILL — ATENOLOL 100 MG TABLET: 100 | 90 days supply | Qty: 90 | Fill #3

## 2019-01-06 MED FILL — buPROPion HCL ER (XL) 300 M: 300 | 90 days supply | Qty: 90 | Fill #1

## 2019-01-07 ENCOUNTER — Ambulatory Visit: Payer: 59 | Admitting: Adult Health

## 2019-01-11 NOTE — Progress Notes (Signed)
FOLLOW UP  Assessment and Plan:   Hypertension At goal today; stable at home Monitor blood pressure at home; patient to call if consistently greater than 130/80 Continue DASH diet - emphasized low sodium and increasing fluid intake  Reminder to go to the ER if any CP, SOB, nausea, dizziness, severe HA, changes vision/speech, left arm numbness and tingling and jaw pain.  Cholesterol Currently on zetia 10 mg, crestor - taking 40 mg twice a week per patient preference - she would be agreeable to increasing to three days a week if indicated by labs  Continue low cholesterol diet and exercise; discussed increasing soluble fiber or adding supplement Check lipid panel.   Prediabetes Well controlled by lifestyle changes  Continue diet and exercise.  Defer A1C this visit to CPE; check CMP  Hypothyroidism/ hx of thyroid cancer s/p resection on suppression therapy  continue medications the same reminded to take on an empty stomach 30-36mins before food.  check TSH level  Vitamin D Def/ osteoporosis prevention Continue supplementation Has been stable at goal at last few visits; defer vit D level  Tobacco use Discussed risks associated with tobacco use and advised to reduce or quit Patient is not ready to do so, but advised to consider strongly Will follow up at the next visit   Continue diet and meds as discussed. Further disposition pending results of labs. Discussed med's effects and SE's.   Over 30 minutes of exam, counseling, chart review, and critical decision making was performed.   Future Appointments  Date Time Provider [place]  04/19/2019 10:30 AM Unk Pinto, MD [facility] None  11/22/2019  3:00 PM Unk Pinto, MD [facility] None    ----------------------------------------------------------------------------------------------------------------------  HPI 63 y.o. female  presents for 3 month follow up on hypertension, cholesterol, history of  prediabetes, post surgical hypothyroid (following thyroid cancer) and vitamin D deficiency.   She is following with Dr. Theda Sers and getting injections in R shoulder for bursitis and arthritis and doing much better, has topical Voltaren if needed.   Patient had been diagnosed in the past with Pseudotumor cerebri consequent of a HA evaluated LP with slightly elevated CSF OP and was treated with Diamox. Last Eval at [facility] discounted that Dx with a Dx/o anomalous Optic Discs and not indeed papilloedema. Has been released by neurology and only follow up if needed.   she currently continues to smoke 1-2 cigarettes a day; discussed risks associated with smoking, patient is not ready to quit.   BMI is Body mass index is 29.52 kg/m., she has been working on diet and exercise but admits not doing much recently due to covid 19 and she doesn't feel safe. She reports she typically gets in 8000-10000 steps daily.  Wt Readings from Last 3 Encounters:  01/12/19 164 lb (74.4 kg)  10/05/18 162 lb 6.4 oz (73.7 kg)  07/06/18 159 lb 12.8 oz (72.5 kg)   Her blood pressure has been controlled at home, taking atenolol 50 mg twice daily, lasix 20 mg once daily, olmesartan 20 mg, today their BP is BP: 128/78 She reports BP at home range from 120-140/70s.  She does not workout. She denies chest pain, shortness of breath, dizziness, fatigue.    She is on cholesterol medication (zetia 10 mg daily, rosuvastatin 40 mg on Tuesdays and Thursday - not taking daily per strong patient preference and fear of complication r/t statin) and denies myalgias. Her cholesterol is not at goal. The cholesterol last visit was:   Lab Results  Component Value Date   CHOL 236 (H) 10/05/2018   HDL 76 10/05/2018   LDLCALC 134 (H) 10/05/2018   TRIG 132 10/05/2018   CHOLHDL 3.1 10/05/2018    She has been working on diet and exercise for history of prediabetes, and denies increased appetite, nausea, paresthesia of the feet,  polydipsia, polyuria, visual disturbances and vomiting. Last A1C in the office was:  Lab Results  Component Value Date   HGBA1C 5.7 (H) 10/05/2018   In 2012, patient had a subtotal thyroidectomy for a MNG and was found to have a small foci of Thyroid cancer. She has been on supressive therapy since. 112 mcg full tab MWF, 1/2 tab all other days- has been stable for a while on this dose   Her medication was not changed last visit.   Lab Results  Component Value Date   TSH 1.86 10/05/2018  . Patient is on Vitamin D supplement and at goal:    Lab Results  Component Value Date   VD25OH 68 10/05/2018      Current Medications:  Current Outpatient Medications on File Prior to Visit  Medication Sig  . aspirin 81 MG tablet Take 81 mg by mouth daily.    Marland Kitchen atenolol (TENORMIN) 100 MG tablet TAKE 1/2 TO 1 TABLET BY MOUTH DAILY FOR BLOOD PRESSURE  . Biotin 5000 MCG TABS Take by mouth daily.  Marland Kitchen buPROPion (WELLBUTRIN XL) 300 MG 24 hr tablet TAKE 1 TABLET BY MOUTH ONCE DAILY  . Cholecalciferol (VITAMIN D) 2000 units CAPS take 3 caps (6,000 units) / daily (Patient taking differently: 2,000 Units. take 3 caps (6,000 units) / daily)  . CINNAMON PO Take 1,000 mg by mouth 2 (two) times daily.   . Cyanocobalamin (VITAMIN B-12) 1000 MCG SUBL Place 1 tablet under the tongue daily.  Marland Kitchen ezetimibe (ZETIA) 10 MG tablet TAKE 1 TABLET BY MOUTH ONCE DAILY  . fexofenadine (ALLEGRA) 180 MG tablet Take 180 mg by mouth daily. Patient OTC Allegra PRN  . fluticasone (FLONASE) 50 MCG/ACT nasal spray PLACE 2 SPRAYS INTO BOTH NOSTRILS DAILY.  . furosemide (LASIX) 40 MG tablet Take 1 tablet daily for BP & Fluid Retention  . levothyroxine (SYNTHROID, LEVOTHROID) 112 MCG tablet TAKE 1 TABLET BY MOUTH ONCE DAILY FOR THYROID  . MINIVELLE 0.025 MG/24HR   . Multiple Vitamins-Minerals (HAIR/SKIN/NAILS PO) Take by mouth daily. 3 tablets daily  . olmesartan (BENICAR) 40 MG tablet Take 1 tablet Daily for BP (Patient taking differently:  Take 1/2 tablet Daily for BP)  . Omega-3 Fatty Acids (FISH OIL) 1000 MG CAPS Take by mouth daily.  . rosuvastatin (CRESTOR) 40 MG tablet TAKE 1/2 TO 1 TABLET BY MOUTH DAILY OR AS DIRECTED FOR CHOLESTEROL  . vitamin E 400 UNIT capsule Take 400 Units by mouth daily.   No current facility-administered medications on file prior to visit.      Allergies:  Allergies  Allergen Reactions  . Erythromycin Nausea And Vomiting and Other (See Comments)    Diarrhea - more severe     Medical History:  Past Medical History:  Diagnosis Date  . Anxiety disorder   . Cancer ([place])    thyroid  . GERD (gastroesophageal reflux disease)   . History of thyroid cancer   . Hyperlipidemia   . Hypertension   . Migraine headache   . Pseudopapilledema of both optic discs    bilateral  . Vitamin D deficiency    Family history- Reviewed and unchanged Social history- Reviewed and unchanged  Review of Systems:  Review of Systems  Constitutional: Negative for malaise/fatigue and weight loss.  HENT: Negative for hearing loss and tinnitus.   Eyes: Negative for blurred vision and double vision.  Respiratory: Negative for cough, shortness of breath and wheezing.   Cardiovascular: Negative for chest pain, palpitations, orthopnea, claudication and leg swelling.  Gastrointestinal: Negative for abdominal pain, blood in stool, constipation, diarrhea, heartburn, melena, nausea and vomiting.  Genitourinary: Negative.   Musculoskeletal: Negative for joint pain and myalgias.  Skin: Negative for rash.  Neurological: Negative for dizziness, tingling, sensory change, weakness and headaches.  Endo/Heme/Allergies: Negative for polydipsia.  Psychiatric/Behavioral: Negative.   All other systems reviewed and are negative.     Physical Exam: BP 128/78   Pulse 65   Temp (!) 97.3 F (36.3 C)   Ht 5' 2.5" (1.588 m)   Wt 164 lb (74.4 kg)   SpO2 98%   BMI 29.52 kg/m  Wt Readings from Last 3 Encounters:  01/12/19  164 lb (74.4 kg)  10/05/18 162 lb 6.4 oz (73.7 kg)  07/06/18 159 lb 12.8 oz (72.5 kg)   General Appearance: Well nourished, in no apparent distress. Eyes: PERRLA, EOMs, conjunctiva no swelling or erythema Sinuses: No Frontal/maxillary tenderness ENT/Mouth: Ext aud canals clear, TMs without erythema, bulging. No erythema, swelling, or exudate on post pharynx.  Tonsils not swollen or erythematous. Hearing normal.  Neck: Supple, thyroid normal.  Respiratory: Respiratory effort normal, BS equal bilaterally without rales, rhonchi, wheezing or stridor.  Cardio: RRR with no MRGs. Brisk peripheral pulses without edema.  Abdomen: Soft, + BS.  Non tender, no guarding, rebound, hernias, masses. Lymphatics: Non tender without lymphadenopathy.  Musculoskeletal: Full ROM, 5/5 strength, Normal gait Skin: Warm, dry without rashes, lesions, ecchymosis.  Neuro: Cranial nerves intact. No cerebellar symptoms.  Psych: Awake and oriented X 3, normal affect, Insight and Judgment appropriate.    Tammy Ribas, NP 11:56 AM [facility]

## 2019-01-12 ENCOUNTER — Other Ambulatory Visit: Payer: Self-pay

## 2019-01-12 ENCOUNTER — Encounter: Payer: Self-pay | Admitting: Adult Health

## 2019-01-12 ENCOUNTER — Ambulatory Visit: Payer: 59 | Admitting: Adult Health

## 2019-01-12 VITALS — BP 128/78 | HR 65 | Temp 97.3°F | Ht 62.5 in | Wt 164.0 lb

## 2019-01-12 DIAGNOSIS — E559 Vitamin D deficiency, unspecified: Secondary | ICD-10-CM

## 2019-01-12 DIAGNOSIS — E890 Postprocedural hypothyroidism: Secondary | ICD-10-CM

## 2019-01-12 DIAGNOSIS — R7303 Prediabetes: Secondary | ICD-10-CM | POA: Diagnosis not present

## 2019-01-12 DIAGNOSIS — Z79899 Other long term (current) drug therapy: Secondary | ICD-10-CM

## 2019-01-12 DIAGNOSIS — E663 Overweight: Secondary | ICD-10-CM | POA: Diagnosis not present

## 2019-01-12 DIAGNOSIS — I10 Essential (primary) hypertension: Secondary | ICD-10-CM

## 2019-01-12 DIAGNOSIS — E782 Mixed hyperlipidemia: Secondary | ICD-10-CM | POA: Diagnosis not present

## 2019-01-12 DIAGNOSIS — Z87898 Personal history of other specified conditions: Secondary | ICD-10-CM | POA: Diagnosis not present

## 2019-01-12 DIAGNOSIS — F17200 Nicotine dependence, unspecified, uncomplicated: Secondary | ICD-10-CM

## 2019-01-12 DIAGNOSIS — C73 Malignant neoplasm of thyroid gland: Secondary | ICD-10-CM

## 2019-01-12 MED FILL — EZETIMIBE 10 MG TABS: 10 | 90 days supply | Qty: 90 | Fill #1

## 2019-01-12 MED FILL — ROSUVASTATIN CALCIUM 40 MG: 40 | 90 days supply | Qty: 90 | Fill #1

## 2019-01-12 NOTE — Patient Instructions (Signed)
Goals     Blood Pressure < 130/80     Exercise 150 min/wk Moderate Activity     Weight (lb) < 160 lb (72.6 kg)       Preventing High Cholesterol Cholesterol is a white, waxy substance similar to fat that the human body needs to help build cells. The liver makes all the cholesterol that a person's body needs. Having high cholesterol (hypercholesterolemia) increases a person's risk for heart disease and stroke. Extra (excess) cholesterol comes from the food the person eats. High cholesterol can often be prevented with diet and lifestyle changes. If you already have high cholesterol, you can control it with diet and lifestyle changes and with medicine. How can high cholesterol affect me? If you have high cholesterol, deposits (plaques) may build up on the walls of your arteries. The arteries are the blood vessels that carry blood away from your heart. Plaques make the arteries narrower and stiffer. This can limit or block blood flow and cause blood clots to form. Blood clots:  Are tiny balls of cells that form in your blood.  Can move to the heart or brain, causing a heart attack or stroke. Plaques in arteries greatly increase your risk for heart attack and stroke.Making diet and lifestyle changes can reduce your risk for these conditions that may threaten your life. What can increase my risk? This condition is more likely to develop in people who:  Eat foods that are high in saturated fat or cholesterol. Saturated fat is mostly found in: ? Foods that contain animal fat, such as red meat and some dairy products. ? Certain fatty foods made from plants, such as tropical oils.  Are overweight.  Are not getting enough exercise.  Have a family history of high cholesterol. What actions can I take to prevent this? Nutrition   Eat less saturated fat.  Avoid trans fats (partially hydrogenated oils). These are often found in margarine and in some baked goods, fried foods, and snacks bought  in packages.  Avoid precooked or cured meat, such as sausages or meat loaves.  Avoid foods and drinks that have added sugars.  Eat more fruits, vegetables, and whole grains.  Choose healthy sources of protein, such as fish, poultry, lean cuts of red meat, beans, peas, lentils, and nuts.  Choose healthy sources of fat, such as: ? Nuts. ? Vegetable oils, especially olive oil. ? Fish that have healthy fats (omega-3 fatty acids), such as mackerel or salmon. The items listed above may not be a complete list of recommended foods and beverages. Contact a dietitian for more information. Lifestyle  Lose weight if you are overweight. Losing 5-10 lb (2.3-4.5 kg) can help prevent or control high cholesterol. It can also lower your risk for diabetes and high blood pressure. Ask your health care provider to help you with a diet and exercise plan to lose weight safely.  Do not use any products that contain nicotine or tobacco, such as cigarettes, e-cigarettes, and chewing tobacco. If you need help quitting, ask your health care provider.  Limit your alcohol intake. ? Do not drink alcohol if:  Your health care provider tells you not to drink.  You are pregnant, may be pregnant, or are planning to become pregnant. ? If you drink alcohol:  Limit how much you use to:  0-1 drink a day for women.  0-2 drinks a day for men.  Be aware of how much alcohol is in your drink. In the [place] one drink equals one  12 oz bottle of beer (355 mL), one 5 oz glass of wine (148 mL), or one 1 oz glass of hard liquor (44 mL). Activity   Get enough exercise. Each week, do at least 150 minutes of exercise that takes a medium level of effort (moderate-intensity exercise). ? This is exercise that:  Makes your heart beat faster and makes you breathe harder than usual.  Allows you to still be able to talk. ? You could exercise in short sessions several times a day or longer sessions a few times a week. For example,  on 5 days each week, you could walk fast or ride your bike 3 times a day for 10 minutes each time.  Do exercises as told by your health care provider. Medicines  In addition to diet and lifestyle changes, your health care provider may recommend medicines to help lower cholesterol. This may be a medicine to lower the amount of cholesterol your liver makes. You may need medicine if: ? Diet and lifestyle changes do not lower your cholesterol enough. ? You have high cholesterol and other risk factors for heart disease or stroke.  Take over-the-counter and prescription medicines only as told by your health care provider. General information  Manage your risk factors for high cholesterol. Talk with your health care provider about all your risk factors and how to lower your risk.  Manage other conditions that you have, such as diabetes or high blood pressure (hypertension).  Have blood tests to check your cholesterol levels at regular points in time as told by your health care provider.  Keep all follow-up visits as told by your health care provider. This is important. Where to find more information  American Heart Association: www.heart.org  National Heart, Lung, and Blood Institute: https://wilson-eaton.com/ Summary  High cholesterol increases your risk for heart disease and stroke. By keeping your cholesterol level low, you can reduce your risk for these conditions.  High cholesterol can often be prevented with diet and lifestyle changes.  Work with your health care provider to manage your risk factors, and have your blood tested regularly. This information is not intended to replace advice given to you by your health care provider. Make sure you discuss any questions you have with your health care provider. Document Released: 07/16/2015 Document Revised: 10/23/2018 Document Reviewed: 03/09/2016 Elsevier Patient Education  2020 [employer].

## 2019-01-13 LAB — CBC WITH DIFFERENTIAL/PLATELET
Absolute Monocytes: 449 cells/uL (ref 200–950)
Basophils Absolute: 31 cells/uL (ref 0–200)
Basophils Relative: 0.6 %
Eosinophils Absolute: 117 cells/uL (ref 15–500)
Eosinophils Relative: 2.3 %
HCT: 41.9 % (ref 35.0–45.0)
Hemoglobin: 13.7 g/dL (ref 11.7–15.5)
Lymphs Abs: 1428 cells/uL (ref 850–3900)
MCH: 31.9 pg (ref 27.0–33.0)
MCHC: 32.7 g/dL (ref 32.0–36.0)
MCV: 97.4 fL (ref 80.0–100.0)
MPV: 12 fL (ref 7.5–12.5)
Monocytes Relative: 8.8 %
Neutro Abs: 3075 cells/uL (ref 1500–7800)
Neutrophils Relative %: 60.3 %
Platelets: 273 10*3/uL (ref 140–400)
RBC: 4.3 10*6/uL (ref 3.80–5.10)
RDW: 12.7 % (ref 11.0–15.0)
Total Lymphocyte: 28 %
WBC: 5.1 10*3/uL (ref 3.8–10.8)

## 2019-01-13 LAB — COMPLETE METABOLIC PANEL WITH GFR
AG Ratio: 1.8 (calc) (ref 1.0–2.5)
ALT: 28 U/L (ref 6–29)
AST: 21 U/L (ref 10–35)
Albumin: 4.4 g/dL (ref 3.6–5.1)
Alkaline phosphatase (APISO): 63 U/L (ref 37–153)
BUN: 13 mg/dL (ref 7–25)
CO2: 32 mmol/L (ref 20–32)
Calcium: 9.5 mg/dL (ref 8.6–10.4)
Chloride: 103 mmol/L (ref 98–110)
Creat: 0.99 mg/dL (ref 0.50–0.99)
GFR, Est African American: 71 mL/min/{1.73_m2} (ref >=60)
GFR, Est Non African American: 61 mL/min/{1.73_m2} (ref >=60)
Globulin: 2.5 g/dL (calc) (ref 1.9–3.7)
Glucose, Bld: 100 mg/dL — ABNORMAL HIGH (ref 65–99)
Potassium: 4.6 mmol/L (ref 3.5–5.3)
Sodium: 139 mmol/L (ref 135–146)
Total Bilirubin: 0.4 mg/dL (ref 0.2–1.2)
Total Protein: 6.9 g/dL (ref 6.1–8.1)

## 2019-01-13 LAB — LIPID PANEL
Cholesterol: 167 mg/dL (ref <200)
HDL: 77 mg/dL (ref >=50)
LDL Cholesterol (Calc): 74 mg/dL (calc)
Non-HDL Cholesterol (Calc): 90 mg/dL (calc) (ref <130)
Total CHOL/HDL Ratio: 2.2 (calc) (ref <5.0)
Triglycerides: 75 mg/dL (ref <150)

## 2019-01-13 LAB — TSH: TSH: 1.78 mIU/L (ref 0.40–4.50)

## 2019-01-13 LAB — MAGNESIUM: Magnesium: 2.2 mg/dL (ref 1.5–2.5)

## 2019-04-01 ENCOUNTER — Other Ambulatory Visit: Payer: Self-pay | Admitting: Internal Medicine

## 2019-04-01 ENCOUNTER — Other Ambulatory Visit: Payer: Self-pay | Admitting: *Deleted

## 2019-04-01 ENCOUNTER — Other Ambulatory Visit: Payer: Self-pay | Admitting: Adult Health

## 2019-04-01 ENCOUNTER — Other Ambulatory Visit: Payer: Self-pay

## 2019-04-01 MED ORDER — ATENOLOL 100 MG PO TABS: ORAL_TABLET | ORAL | 3 refills | Status: DC

## 2019-04-01 MED ORDER — LEVOTHYROXINE SODIUM 112 MCG PO TABS: ORAL_TABLET | ORAL | 3 refills | Status: DC

## 2019-04-01 MED FILL — ATENOLOL 100 MG TABLET: 100 | 90 days supply | Qty: 90 | Fill #0

## 2019-04-01 MED FILL — LEVOTHYROXINE 112 MCG TAB: 112 | 90 days supply | Qty: 90 | Fill #0

## 2019-04-01 MED FILL — OLMESARTAN MEDOXOMIL 40 MG: 40 | 90 days supply | Qty: 90 | Fill #1

## 2019-04-09 ENCOUNTER — Other Ambulatory Visit: Payer: Self-pay | Admitting: Internal Medicine

## 2019-04-09 MED FILL — FLUTICASONE PROP 50 MCG SPR: 50 | 90 days supply | Qty: 48 | Fill #0

## 2019-04-09 MED FILL — FUROSEMIDE 40 MG TAB: 40 | 90 days supply | Qty: 90 | Fill #2

## 2019-04-14 ENCOUNTER — Other Ambulatory Visit: Payer: Self-pay | Admitting: Internal Medicine

## 2019-04-16 ENCOUNTER — Other Ambulatory Visit: Payer: Self-pay | Admitting: Internal Medicine

## 2019-04-16 MED ORDER — EZETIMIBE 10 MG PO TABS: ORAL_TABLET | ORAL | 3 refills | Status: DC

## 2019-04-17 ENCOUNTER — Other Ambulatory Visit: Payer: Self-pay | Admitting: Internal Medicine

## 2019-04-18 ENCOUNTER — Other Ambulatory Visit: Payer: Self-pay | Admitting: Internal Medicine

## 2019-04-18 ENCOUNTER — Encounter: Payer: Self-pay | Admitting: Internal Medicine

## 2019-04-18 MED ORDER — EZETIMIBE 10 MG PO TABS: ORAL_TABLET | ORAL | 3 refills | Status: DC

## 2019-04-18 NOTE — Progress Notes (Signed)
History of Present Illness:      This very nice 63 y.o. DBF presents for 6 month follow up with HTN, HLD, Pre-Diabetes, Hypothyroidism and Vitamin D Deficiency.                                      ((    (copy) Patient had been dx'd in the past w/ Pseudotumor cerebri consequent of a HA evaluated &LP with sl elevated CSF OP (275 mm)  &was treated w/Diamox. Last Eval at [facility] discounted that [place] a Dx/o anomalous Optic Discs and NOT PAPILLEDEMA.))      Patient is treated for HTN (1992) & BP has been controlled at home. Today's BP is at goal -  136/76. Patient has had no complaints of any cardiac type chest pain, palpitations, dyspnea / orthopnea / PND, dizziness, claudication, or dependent edema.      Hyperlipidemia is controlled with diet & Rosuvastatin / Zetia. Patient denies myalgias or other med SE's. Last Lipids were at goal:  Lab Results  Component Value Date   CHOL 167 01/12/2019   HDL 77 01/12/2019   LDLCALC 74 01/12/2019   TRIG 75 01/12/2019   CHOLHDL 2.2 01/12/2019       Also, the patient has history of PreDiabetes (A1c 5.8% / 2011)  and has had no symptoms of reactive hypoglycemia, diabetic polys, paresthesias or visual blurring.  Last A1c was near goal:  Lab Results  Component Value Date   HGBA1C 5.7 (H) 10/05/2018       Patient had a subtotal thyroidectomy for a MNG in 2012 and was found to have a small foci of Thyroid cancer. She has been on supressive therapy since.        Further, the patient also has history of Vitamin D Deficiency ("33" / 2008)  and supplements vitamin D without any suspected side-effects. Last vitamin D was at goal:  Lab Results  Component Value Date   VD25OH 68 10/05/2018   Current Outpatient Medications on File Prior to Visit  Medication Sig  . aspirin 81 MG tablet Take 81 mg by mouth daily.    Marland Kitchen atenolol (TENORMIN) 100 MG tablet TAKE 1/2 TO 1 TABLET BY MOUTH DAILY FOR BLOOD PRESSURE  . Biotin 5000 MCG TABS Take by  mouth daily.  Marland Kitchen buPROPion (WELLBUTRIN XL) 300 MG 24 hr tablet TAKE 1 TABLET BY MOUTH ONCE DAILY  . Cholecalciferol (VITAMIN D) 2000 units CAPS take 3 caps (6,000 units) / daily (Patient taking differently: 5,000 Units daily. )  . CINNAMON PO Take 1,000 mg by mouth 2 (two) times daily.   . Cyanocobalamin (VITAMIN B-12) 1000 MCG SUBL Place 1 tablet under the tongue daily.  Marland Kitchen ezetimibe (ZETIA) 10 MG tablet Take 1 tablet Daily for  Cholesterol  . fexofenadine (ALLEGRA) 180 MG tablet Take 180 mg by mouth daily. Patient OTC Allegra PRN  . fluticasone (FLONASE) 50 MCG/ACT nasal spray PLACE 2 SPRAYS INTO BOTH NOSTRILS DAILY.  . furosemide (LASIX) 40 MG tablet Take 1 tablet daily for BP & Fluid Retention  . levothyroxine (SYNTHROID) 112 MCG tablet TAKE 1 TABLET BY MOUTH ONCE DAILY FOR THYROID  . MINIVELLE 0.025 MG/24HR   . Multiple Vitamins-Minerals (HAIR/SKIN/NAILS PO) Take by mouth daily. 3 tablets daily  . olmesartan (BENICAR) 40 MG tablet Take 1 tablet Daily for BP (Patient taking differently: Take 1/2 tablet Daily for  BP)  . Omega-3 Fatty Acids (FISH OIL) 1000 MG CAPS Take by mouth daily.  . rosuvastatin (CRESTOR) 40 MG tablet TAKE 1/2 TO 1 TABLET BY MOUTH DAILY OR AS DIRECTED FOR CHOLESTEROL  . vitamin E 400 UNIT capsule Take 400 Units by mouth daily.   No current facility-administered medications on file prior to visit.     Allergies  Allergen Reactions  . Erythromycin Nausea And Vomiting and Other (See Comments)    Diarrhea - more severe   PMHx:   Past Medical History:  Diagnosis Date  . Anxiety disorder   . Cancer ([place])    thyroid  . GERD (gastroesophageal reflux disease)   . History of thyroid cancer   . Hyperlipidemia   . Hypertension   . Migraine headache   . Pseudopapilledema of both optic discs    bilateral  . Vitamin D deficiency    Immunization History  Administered Date(s) Administered  . DTaP 12/14/1994  . Hepatitis B 07/15/1989  . PPD Test 07/18/2014, 08/04/2015,  10/05/2018  . Pneumococcal Polysaccharide-23 10/09/1998  . Tdap 05/03/2015   Past Surgical History:  Procedure Laterality Date  . ABDOMINAL HYSTERECTOMY  2005  . THYROIDECTOMY  04/25/11    FHx:    Reviewed / unchanged  SHx:    Reviewed / unchanged   Systems Review:  Constitutional: Denies fever, chills, wt changes, headaches, insomnia, fatigue, night sweats, change in appetite. Eyes: Denies redness, blurred vision, diplopia, discharge, itchy, watery eyes.  ENT: Denies discharge, congestion, post nasal drip, epistaxis, sore throat, earache, hearing loss, dental pain, tinnitus, vertigo, sinus pain, snoring.  CV: Denies chest pain, palpitations, irregular heartbeat, syncope, dyspnea, diaphoresis, orthopnea, PND, claudication or edema. Respiratory: denies cough, dyspnea, DOE, pleurisy, hoarseness, laryngitis, wheezing.  Gastrointestinal: Denies dysphagia, odynophagia, heartburn, reflux, water brash, abdominal pain or cramps, nausea, vomiting, bloating, diarrhea, constipation, hematemesis, melena, hematochezia  or hemorrhoids. Genitourinary: Denies dysuria, frequency, urgency, nocturia, hesitancy, discharge, hematuria or flank pain. Musculoskeletal: Denies arthralgias, myalgias, stiffness, jt. swelling, pain, limping or strain/sprain.  Skin: Denies pruritus, rash, hives, warts, acne, eczema or change in skin lesion(s). Neuro: No weakness, tremor, incoordination, spasms, paresthesia or pain. Psychiatric: Denies confusion, memory loss or sensory loss. Endo: Denies change in weight, skin or hair change.  Heme/Lymph: No excessive bleeding, bruising or enlarged lymph nodes.  Physical Exam  BP 136/76   Pulse 72   Temp 97.9 F (36.6 C)   Resp 16   Ht 5' 2.5" (1.588 m)   Wt 167 lb 3.2 oz (75.8 kg)   BMI 30.09 kg/m   Appears  well nourished, well groomed  and in no distress.  Eyes: PERRLA, EOMs, conjunctiva no swelling or erythema. Sinuses: No frontal/maxillary tenderness ENT/Mouth:  EAC's clear, TM's nl w/o erythema, bulging. Nares clear w/o erythema, swelling, exudates. Oropharynx clear without erythema or exudates. Oral hygiene is good. Tongue normal, non obstructing. Hearing intact.  Neck: Supple. Thyroid not palpable. Car 2+/2+ without bruits, nodes or JVD. Chest: Respirations nl with BS clear & equal w/o rales, rhonchi, wheezing or stridor.  Cor: Heart sounds normal w/ regular rate and rhythm without sig. murmurs, gallops, clicks or rubs. Peripheral pulses normal and equal  without edema.  Abdomen: Soft & bowel sounds normal. Non-tender w/o guarding, rebound, hernias, masses or organomegaly.  Lymphatics: Unremarkable.  Musculoskeletal: Full ROM all peripheral extremities, joint stability, 5/5 strength and normal gait.  Skin: Warm, dry without exposed rashes, lesions or ecchymosis apparent.  Neuro: Cranial nerves intact, reflexes equal bilaterally. Sensory-motor testing  grossly intact. Tendon reflexes grossly intact.  Pysch: Alert & oriented x 3.  Insight and judgement nl & appropriate. No ideations.  Assessment and Plan:  1. Essential hypertension  - Continue medication, monitor blood pressure at home.  - Continue DASH diet.  Reminder to go to the ER if any CP,  SOB, nausea, dizziness, severe HA, changes vision/speech.  - CBC with Differential/Platelet - COMPLETE METABOLIC PANEL WITH GFR - Magnesium - TSH  2. Hyperlipidemia, mixed  - Continue diet/meds, exercise,& lifestyle modifications.  - Continue monitor periodic cholesterol/liver & renal functions   - Lipid panel - TSH  3. Abnormal glucose  - Continue diet, exercise  - Lifestyle modifications.  - Monitor appropriate labs.  - Hemoglobin A1c - Insulin, random  4. Vitamin D deficiency  - Continue supplementation.  - VITAMIN D 25 Hydroxyl  5. Hypothyroidism  - TSH  6. Medication management  - CBC with Differential/Platelet - COMPLETE METABOLIC PANEL WITH GFR - Magnesium - Lipid panel  - TSH - Hemoglobin A1c - Insulin, random - VITAMIN D 25 Hydroxyl       Discussed  regular exercise, BP monitoring, weight control to achieve/maintain BMI less than 25 and discussed med and SE's. Recommended labs to assess and monitor clinical status with further disposition pending results of labs.  I discussed the assessment and treatment plan with the patient. The patient was provided an opportunity to ask questions and all were answered. The patient agreed with the plan and demonstrated an understanding of the instructions.  I provided over 30 minutes of exam, counseling, chart review and  complex critical decision making.  Kirtland Bouchard, MD

## 2019-04-18 NOTE — Patient Instructions (Signed)

## 2019-04-19 ENCOUNTER — Other Ambulatory Visit: Payer: Self-pay

## 2019-04-19 ENCOUNTER — Ambulatory Visit: Payer: 59 | Admitting: Internal Medicine

## 2019-04-19 ENCOUNTER — Encounter: Payer: Self-pay | Admitting: Internal Medicine

## 2019-04-19 VITALS — BP 136/76 | HR 72 | Temp 97.9°F | Resp 16 | Ht 62.5 in | Wt 167.2 lb

## 2019-04-19 DIAGNOSIS — Z79899 Other long term (current) drug therapy: Secondary | ICD-10-CM | POA: Diagnosis not present

## 2019-04-19 DIAGNOSIS — E039 Hypothyroidism, unspecified: Secondary | ICD-10-CM | POA: Diagnosis not present

## 2019-04-19 DIAGNOSIS — I10 Essential (primary) hypertension: Secondary | ICD-10-CM | POA: Diagnosis not present

## 2019-04-19 DIAGNOSIS — R7309 Other abnormal glucose: Secondary | ICD-10-CM

## 2019-04-19 DIAGNOSIS — E559 Vitamin D deficiency, unspecified: Secondary | ICD-10-CM

## 2019-04-19 DIAGNOSIS — E782 Mixed hyperlipidemia: Secondary | ICD-10-CM | POA: Diagnosis not present

## 2019-04-20 ENCOUNTER — Other Ambulatory Visit: Payer: Self-pay | Admitting: *Deleted

## 2019-04-20 LAB — CBC WITH DIFFERENTIAL/PLATELET
Absolute Monocytes: 328 cells/uL (ref 200–950)
Basophils Absolute: 31 cells/uL (ref 0–200)
Basophils Relative: 0.8 %
Eosinophils Absolute: 70 cells/uL (ref 15–500)
Eosinophils Relative: 1.8 %
HCT: 41.7 % (ref 35.0–45.0)
Hemoglobin: 13.8 g/dL (ref 11.7–15.5)
Lymphs Abs: 1201 cells/uL (ref 850–3900)
MCH: 31.4 pg (ref 27.0–33.0)
MCHC: 33.1 g/dL (ref 32.0–36.0)
MCV: 95 fL (ref 80.0–100.0)
MPV: 12.3 fL (ref 7.5–12.5)
Monocytes Relative: 8.4 %
Neutro Abs: 2270 cells/uL (ref 1500–7800)
Neutrophils Relative %: 58.2 %
Platelets: 251 10*3/uL (ref 140–400)
RBC: 4.39 10*6/uL (ref 3.80–5.10)
RDW: 11.9 % (ref 11.0–15.0)
Total Lymphocyte: 30.8 %
WBC: 3.9 10*3/uL (ref 3.8–10.8)

## 2019-04-20 LAB — HEMOGLOBIN A1C
Hgb A1c MFr Bld: 5.4 % of total Hgb (ref <5.7)
Mean Plasma Glucose: 108 (calc)
eAG (mmol/L): 6 (calc)

## 2019-04-20 LAB — COMPLETE METABOLIC PANEL WITH GFR
AG Ratio: 1.6 (calc) (ref 1.0–2.5)
ALT: 22 U/L (ref 6–29)
AST: 17 U/L (ref 10–35)
Albumin: 4.3 g/dL (ref 3.6–5.1)
Alkaline phosphatase (APISO): 63 U/L (ref 37–153)
BUN: 10 mg/dL (ref 7–25)
CO2: 32 mmol/L (ref 20–32)
Calcium: 9.2 mg/dL (ref 8.6–10.4)
Chloride: 103 mmol/L (ref 98–110)
Creat: 0.91 mg/dL (ref 0.50–0.99)
GFR, Est African American: 78 mL/min/{1.73_m2} (ref >=60)
GFR, Est Non African American: 68 mL/min/{1.73_m2} (ref >=60)
Globulin: 2.7 g/dL (calc) (ref 1.9–3.7)
Glucose, Bld: 97 mg/dL (ref 65–99)
Potassium: 4.3 mmol/L (ref 3.5–5.3)
Sodium: 139 mmol/L (ref 135–146)
Total Bilirubin: 0.5 mg/dL (ref 0.2–1.2)
Total Protein: 7 g/dL (ref 6.1–8.1)

## 2019-04-20 LAB — LIPID PANEL
Cholesterol: 144 mg/dL (ref <200)
HDL: 71 mg/dL (ref >=50)
LDL Cholesterol (Calc): 58 mg/dL (calc)
Non-HDL Cholesterol (Calc): 73 mg/dL (calc) (ref <130)
Total CHOL/HDL Ratio: 2 (calc) (ref <5.0)
Triglycerides: 70 mg/dL (ref <150)

## 2019-04-20 LAB — MAGNESIUM: Magnesium: 2.1 mg/dL (ref 1.5–2.5)

## 2019-04-20 LAB — INSULIN, RANDOM: Insulin: 5.7 u[IU]/mL

## 2019-04-20 LAB — VITAMIN D 25 HYDROXY (VIT D DEFICIENCY, FRACTURES): Vit D, 25-Hydroxy: 65 ng/mL (ref 30–100)

## 2019-04-20 LAB — TSH: TSH: 2.02 mIU/L (ref 0.40–4.50)

## 2019-04-20 MED ORDER — EZETIMIBE 10 MG PO TABS: ORAL_TABLET | ORAL | 3 refills | Status: DC

## 2019-04-20 MED FILL — EZETIMIBE 10 MG TABS: 10 | 90 days supply | Qty: 90 | Fill #0

## 2019-04-20 MED FILL — ROSUVASTATIN CALCIUM 40 MG: 40 | 90 days supply | Qty: 90 | Fill #0

## 2019-06-02 ENCOUNTER — Other Ambulatory Visit: Payer: Self-pay | Admitting: Internal Medicine

## 2019-06-02 MED FILL — BUPROPION HCL ER (XL) 300 M: 300 | 30 days supply | Qty: 30 | Fill #0

## 2019-06-02 MED FILL — ESTRADIOL 0.025 MG/24HR PTT: 0.025 | 84 days supply | Qty: 24 | Fill #2

## 2019-06-24 MED FILL — LEVOTHYROXINE 112 MCG TAB: 112 | 90 days supply | Qty: 90 | Fill #1

## 2019-06-24 MED FILL — ATENOLOL 100 MG TABLET: 100 | 90 days supply | Qty: 90 | Fill #1

## 2019-07-14 MED FILL — BUPROPION HCL XL 300 MG TAB: 300 | 30 days supply | Qty: 30 | Fill #1

## 2019-07-21 ENCOUNTER — Encounter: Payer: Self-pay | Admitting: Adult Health Nurse Practitioner

## 2019-07-21 NOTE — Progress Notes (Signed)
FOLLOW UP  Assessment and Plan:   Essential Hypertension  Continue Monitor blood pressure at home; patient to call if consistently greater than 130/80 Continue DASH diet - emphasized low sodium and increasing fluid intake  Reminder to go to the ER if any CP, SOB, nausea, dizziness, severe HA, changes vision/speech, left arm numbness and tingling and jaw pain.  Hyperlipademia Currently on zetia 10 mg, crestor - taking 40 mg twice a week per patient preference  Continue low cholesterol diet and exercise; Diiscussed increasing soluble fiber or adding supplement -LIPIDS  Abnormal glucose Well controlled by lifestyle changes  Continue diet and exercise.  Defer A1C this visit to CPE; check CMP  Hypothyroidism/ hx of thyroid cancer s/p resection on suppression therapy  Continue: Levothyroxine 132mcg whole tablet three days a week, half tablet 4 days a week. Reminded to take on an empty stomach 30-79mins before food.  -TSH level  Vitamin D Def/ osteoporosis prevention Continue supplementation Taking Vitamin D 6,000IU IU daily              Has been stable at goal at last few visits         Defer Vit D level  Tobacco use Discussed risks associated with tobacco use and advised to reduce or quit Patient is not ready to do so, but advised to consider strongly Will follow up at the next visit   Continue diet and meds as discussed. Further disposition pending results of labs. Discussed med's effects and SE's.   Over 30 minutes of interview, exam, counseling, chart review, and critical decision making was performed.   Future Appointments  Date Time Provider [place]  11/22/2019  3:00 PM Unk Pinto, MD [facility] None    ----------------------------------------------------------------------------------------------------------------------  HPI 64 y.o. female  presents for 3 month follow up on HTN, HLD, abnormal glucose, post surgical hypothyroid (following thyroid  cancer) and vitamin D deficiency.   She was following with Dr. Theda Sers and getting injections in R shoulder for bursitis and arthritis and doing much better, has topical Voltaren if needed. Doing well at this time.  Patient had been diagnosed in the past with Pseudotumor cerebri consequent of a HA evaluated LP with slightly elevated CSF OP and was treated with Diamox. Last Eval at [facility] discounted that Dx with a Dx/o anomalous Optic Discs and not indeed papilloedema. Has been released by neurology and only follow up if needed.   She currently continues to smoke 1-2 cigarettes a day; discussed risks associated with smoking, patient is not ready to quit at this time.  BMI is Body mass index is 31.32 kg/m., she has been working on diet and exercise but admits not doing much recently due to covid 19 and she doesn't feel safe. She reports she typically gets in 8000-10000 steps daily.  Wt Readings from Last 3 Encounters:  07/22/19 174 lb (78.9 kg)  04/19/19 167 lb 3.2 oz (75.8 kg)  01/12/19 164 lb (74.4 kg)   Her blood pressure has been controlled at home, taking atenolol 50 mg twice daily, lasix 20 mg once daily, olmesartan 20 mg, today their BP is BP: 140/74 She reports BP at home range from 120-140/70s.  She does not workout. She denies chest pain, shortness of breath, dizziness, fatigue.    She is on cholesterol medication (zetia 10 mg daily, rosuvastatin 40 mg on Tuesdays and Thursday - not taking daily per strong patient preference and fear of complication r/t statin) and denies myalgias. Her cholesterol is not at  goal. The cholesterol last visit was:   Lab Results  Component Value Date   CHOL 144 04/19/2019   HDL 71 04/19/2019   LDLCALC 58 04/19/2019   TRIG 70 04/19/2019   CHOLHDL 2.0 04/19/2019    She has been working on diet and exercise for history of prediabetes, and denies increased appetite, nausea, paresthesia of the feet, polydipsia, polyuria, visual disturbances and  vomiting. Last A1C in the office was:  Lab Results  Component Value Date   HGBA1C 5.4 04/19/2019   In 2012, patient had a subtotal thyroidectomy for a MNG and was found to have a small foci of Thyroid cancer. She has been on supressive therapy since. 112 mcg full tab MWF, 1/2 tab all other days- has been stable for a while on this dose   Her medication was not changed last visit.   Lab Results  Component Value Date   TSH 2.02 04/19/2019  . Patient is on Vitamin D supplement and at goal:    Lab Results  Component Value Date   VD25OH 9 04/19/2019      Current Medications:  Current Outpatient Medications on File Prior to Visit  Medication Sig  . aspirin 81 MG tablet Take 81 mg by mouth daily.    Marland Kitchen atenolol (TENORMIN) 100 MG tablet TAKE 1/2 TO 1 TABLET BY MOUTH DAILY FOR BLOOD PRESSURE  . Biotin 5000 MCG TABS Take by mouth daily.  Marland Kitchen buPROPion (WELLBUTRIN XL) 300 MG 24 hr tablet Take 1 tablet Daily for Mood, Focus & Concentration  . Cholecalciferol (VITAMIN D) 2000 units CAPS take 3 caps (6,000 units) / daily (Patient taking differently: 5,000 Units daily. )  . CINNAMON PO Take 1,000 mg by mouth 2 (two) times daily.   . Cyanocobalamin (VITAMIN B-12) 1000 MCG SUBL Place 1 tablet under the tongue daily.  Kendall Flack 575 MG/5ML SYRP Take by mouth daily.  Marland Kitchen ezetimibe (ZETIA) 10 MG tablet Take 1 tablet Daily for  Cholesterol  . fexofenadine (ALLEGRA) 180 MG tablet Take 180 mg by mouth daily. Patient OTC Allegra PRN  . fluticasone (FLONASE) 50 MCG/ACT nasal spray PLACE 2 SPRAYS INTO BOTH NOSTRILS DAILY.  . furosemide (LASIX) 40 MG tablet Take 1 tablet daily for BP & Fluid Retention  . levothyroxine (SYNTHROID) 112 MCG tablet TAKE 1 TABLET BY MOUTH ONCE DAILY FOR THYROID  . Magnesium 500 MG CAPS Take by mouth daily.  Marland Kitchen MINIVELLE 0.025 MG/24HR   . Multiple Vitamins-Minerals (HAIR/SKIN/NAILS PO) Take by mouth daily. 3 tablets daily  . olmesartan (BENICAR) 40 MG tablet Take 1 tablet Daily for  BP (Patient taking differently: Take 1/2 tablet Daily for BP)  . Omega-3 Fatty Acids (FISH OIL) 1000 MG CAPS Take by mouth daily.  . rosuvastatin (CRESTOR) 40 MG tablet TAKE 1/2 TO 1 TABLET BY MOUTH DAILY OR AS DIRECTED FOR CHOLESTEROL  . vitamin E 400 UNIT capsule Take 400 Units by mouth daily.  . Zinc 25 MG TABS Take by mouth daily.   No current facility-administered medications on file prior to visit.     Allergies:  Allergies  Allergen Reactions  . Erythromycin Nausea And Vomiting and Other (See Comments)    Diarrhea - more severe     Medical History:  Past Medical History:  Diagnosis Date  . Anxiety disorder   . Cancer ([place])    thyroid  . GERD (gastroesophageal reflux disease)   . History of thyroid cancer   . Hypertension   . Migraine headache   .  Pseudopapilledema of both optic discs    bilateral  . Vitamin D deficiency    Family history- Reviewed and unchanged Social history- Reviewed and unchanged    Screening Tests: Immunization History  Administered Date(s) Administered  . DTaP 12/14/1994  . Hepatitis B 07/15/1989  . PPD Test 07/18/2014, 08/04/2015, 10/05/2018  . Pneumococcal Polysaccharide-23 10/09/1998  . Tdap 05/03/2015    Vaccinations: TD or Tdap: 2016 Influenza: 03/2019  Pneumococcal: 09/1998 Prevnar13: At age 11 Shingles/Zostavax: Discussed with patient COVID 12/21 & 07/21/18  Preventative care: Last colonoscopy: 2019 Last mammogram: 2018 Last pap smear/pelvic exam: 2013 [facility], next week DEXA: 2014   Review of Systems:  Review of Systems  Constitutional: Negative for malaise/fatigue and weight loss.  HENT: Negative for hearing loss and tinnitus.   Eyes: Negative for blurred vision and double vision.  Respiratory: Negative for cough, shortness of breath and wheezing.   Cardiovascular: Negative for chest pain, palpitations, orthopnea, claudication and leg swelling.  Gastrointestinal: Negative for abdominal pain, blood in  stool, constipation, diarrhea, heartburn, melena, nausea and vomiting.  Genitourinary: Negative.   Musculoskeletal: Negative for joint pain and myalgias.  Skin: Negative for rash.  Neurological: Negative for dizziness, tingling, sensory change, weakness and headaches.  Endo/Heme/Allergies: Negative for polydipsia.  Psychiatric/Behavioral: Negative.   All other systems reviewed and are negative.     Physical Exam: BP 140/74   Pulse 60   Temp 97.7 F (36.5 C)   Wt 174 lb (78.9 kg)   SpO2 99%   BMI 31.32 kg/m  Wt Readings from Last 3 Encounters:  07/22/19 174 lb (78.9 kg)  04/19/19 167 lb 3.2 oz (75.8 kg)  01/12/19 164 lb (74.4 kg)   General Appearance: Well nourished, in no apparent distress. Eyes: PERRLA, EOMs, conjunctiva no swelling or erythema Sinuses: No Frontal/maxillary tenderness ENT/Mouth: Ext aud canals clear, TMs without erythema, bulging. Hearing normal.  Neck: Supple, thyroid normal.  Respiratory: Respiratory effort normal, BS equal bilaterally without rales, rhonchi, wheezing or stridor.  Cardio: RRR with no MRGs. Brisk peripheral pulses without edema.  Abdomen: Soft, + BS.  Non tender, no guarding, rebound, hernias, masses. Lymphatics: Non tender without lymphadenopathy.  Musculoskeletal: Full ROM, 5/5 strength, Normal gait Skin: Warm, dry without rashes, lesions, ecchymosis.  Neuro: Cranial nerves intact. No cerebellar symptoms.  Psych: Awake and oriented X 3, normal affect, Insight and Judgment appropriate.    Garnet Sierras, NP 10:30 AM [facility]

## 2019-07-21 NOTE — Patient Instructions (Addendum)
We will contact you with your lab results via MyChart in 1-3 days.  Increase activity, even indoors, stretching etc.  Decrease caffeine intake at night to see if this decreases symptoms when laying down.     Vit D  & Vit C 1,000 mg   are recommended to help protect  against the Covid-19 and other Corona viruses.    Also it's recommended  to take  Zinc 50 mg  to help  protect against the Covid-19   and best place to get  is also on [email or web address]  and don't pay more than 6-8 cents /pill !  ================================ Coronavirus (COVID-19) Are you at risk?  Are you at risk for the Coronavirus (COVID-19)?  To be considered HIGH RISK for Coronavirus (COVID-19), you have to meet the following criteria:  . Traveled to [place], [place], [place], [place] or [place]; or in the [place] to [place], [place], [place]  . or [place]; and have fever, cough, and shortness of breath within the last 2 weeks of travel OR . Been in close contact with a person diagnosed with COVID-19 within the last 2 weeks and have  . fever, cough,and shortness of breath .  . IF YOU DO NOT MEET THESE CRITERIA, YOU ARE CONSIDERED LOW RISK FOR COVID-19.  What to do if you are HIGH RISK for COVID-19?  Marland Kitchen If you are having a medical emergency, call 911. . Seek medical care right away. Before you go to a doctor's office, urgent care or emergency department, .  call ahead and tell them about your recent travel, contact with someone diagnosed with COVID-19  .  and your symptoms.  . You should receive instructions from your physician's office regarding next steps of care.  . When you arrive at healthcare provider, tell the healthcare staff immediately you have returned from  . visiting [place], [place], [place], [place] or [place]; or traveled in the [place] to [place], [place],  . [place] or [place] in the last two weeks or you have been in close contact with a person diagnosed with   . COVID-19 in the last 2 weeks.   . Tell the health care staff about your symptoms: fever, cough and shortness of breath. . After you have been seen by a medical provider, you will be either: o Tested for (COVID-19) and discharged home on quarantine except to seek medical care if  o symptoms worsen, and asked to  - Stay home and avoid contact with others until you get your results (4-5 days)  - Avoid travel on public transportation if possible (such as bus, train, or airplane) or o Sent to the Emergency Department by EMS for evaluation, COVID-19 testing  and  o possible admission depending on your condition and test results.  What to do if you are LOW RISK for COVID-19?  Reduce your risk of any infection by using the same precautions used for avoiding the common cold or flu:  Marland Kitchen Wash your hands often with soap and warm water for at least 20 seconds.  If soap and water are not readily available,  . use an alcohol-based hand sanitizer with at least 60% alcohol.  . If coughing or sneezing, cover your mouth and nose by coughing or sneezing into the elbow areas of your shirt or coat, .  into a tissue or into your sleeve (not your hands). . Avoid shaking hands with others and consider head nods or verbal greetings only. Marland Kitchen  Avoid touching your eyes, nose, or mouth with unwashed hands.  . Avoid close contact with people who are sick. . Avoid places or events with large numbers of people in one location, like concerts or sporting events. . Carefully consider travel plans you have or are making. . If you are planning any travel outside or inside the [place], visit the CDC's Travelers' Health webpage for the latest health notices. . If you have some symptoms but not all symptoms, continue to monitor at home and seek medical attention  . if your symptoms worsen. . If you are having a medical emergency, call 911. >>>>>>>>>>>>>>>>>>>>>>> Preventive Care for Adults  A healthy lifestyle and preventive care  can promote health and wellness. Preventive health guidelines for women include the following key practices.  A routine yearly physical is a good way to check with your health care provider about your health and preventive screening. It is a chance to share any concerns and updates on your health and to receive a thorough exam.  Visit your dentist for a routine exam and preventive care every 6 months. Brush your teeth twice a day and floss once a day. Good oral hygiene prevents tooth decay and gum disease.  The frequency of eye exams is based on your age, health, family medical history, use of contact lenses, and other factors. Follow your health care provider's recommendations for frequency of eye exams.  Eat a healthy diet. Foods like vegetables, fruits, whole grains, low-fat dairy products, and lean protein foods contain the nutrients you need without too many calories. Decrease your intake of foods high in solid fats, added sugars, and salt. Eat the right amount of calories for you. Get information about a proper diet from your health care provider, if necessary.  Regular physical exercise is one of the most important things you can do for your health. Most adults should get at least 150 minutes of moderate-intensity exercise (any activity that increases your heart rate and causes you to sweat) each week. In addition, most adults need muscle-strengthening exercises on 2 or more days a week.  Maintain a healthy weight. The body mass index (BMI) is a screening tool to identify possible weight problems. It provides an estimate of body fat based on height and weight. Your health care provider can find your BMI and can help you achieve or maintain a healthy weight. For adults 20 years and older:  A BMI below 18.5 is considered underweight.  A BMI of 18.5 to 24.9 is normal.  A BMI of 25 to 29.9 is considered overweight.  A BMI of 30 and above is considered obese.  Maintain normal blood lipids and  cholesterol levels by exercising and minimizing your intake of saturated fat. Eat a balanced diet with plenty of fruit and vegetables. Blood tests for lipids and cholesterol should begin at age 42 and be repeated every 5 years. If your lipid or cholesterol levels are high, you are over 50, or you are at high risk for heart disease, you may need your cholesterol levels checked more frequently. Ongoing high lipid and cholesterol levels should be treated with medicines if diet and exercise are not working.  If you smoke, find out from your health care provider how to quit. If you do not use tobacco, do not start.  Lung cancer screening is recommended for adults aged 23-80 years who are at high risk for developing lung cancer because of a history of smoking. A yearly low-dose CT scan of the  lungs is recommended for people who have at least a 30-pack-year history of smoking and are a current smoker or have quit within the past 15 years. A pack year of smoking is smoking an average of 1 pack of cigarettes a day for 1 year (for example: 1 pack a day for 30 years or 2 packs a day for 15 years). Yearly screening should continue until the smoker has stopped smoking for at least 15 years. Yearly screening should be stopped for people who develop a health problem that would prevent them from having lung cancer treatment.  High blood pressure causes heart disease and increases the risk of stroke. Your blood pressure should be checked at least every 1 to 2 years. Ongoing high blood pressure should be treated with medicines if weight loss and exercise do not work.  If you are 106-57 years old, ask your health care provider if you should take aspirin to prevent strokes.  Diabetes screening involves taking a blood sample to check your fasting blood sugar level. This should be done once every 3 years, after age 10, if you are within normal weight and without risk factors for diabetes. Testing should be considered at a  younger age or be carried out more frequently if you are overweight and have at least 1 risk factor for diabetes.  Breast cancer screening is essential preventive care for women. You should practice "breast self-awareness." This means understanding the normal appearance and feel of your breasts and may include breast self-examination. Any changes detected, no matter how small, should be reported to a health care provider. Women in their 58s and 30s should have a clinical breast exam (CBE) by a health care provider as part of a regular health exam every 1 to 3 years. After age 51, women should have a CBE every year. Starting at age 55, women should consider having a mammogram (breast X-ray test) every year. Women who have a family history of breast cancer should talk to their health care provider about genetic screening. Women at a high risk of breast cancer should talk to their health care providers about having an MRI and a mammogram every year.  Breast cancer gene (BRCA)-related cancer risk assessment is recommended for women who have family members with BRCA-related cancers. BRCA-related cancers include breast, ovarian, tubal, and peritoneal cancers. Having family members with these cancers may be associated with an increased risk for harmful changes (mutations) in the breast cancer genes BRCA1 and BRCA2. Results of the assessment will determine the need for genetic counseling and BRCA1 and BRCA2 testing.  Routine pelvic exams to screen for cancer are no longer recommended for nonpregnant women who are considered low risk for cancer of the pelvic organs (ovaries, uterus, and vagina) and who do not have symptoms. Ask your health care provider if a screening pelvic exam is right for you.  If you have had past treatment for cervical cancer or a condition that could lead to cancer, you need Pap tests and screening for cancer for at least 20 years after your treatment. If Pap tests have been discontinued, your  risk factors (such as having a new sexual partner) need to be reassessed to determine if screening should be resumed. Some women have medical problems that increase the chance of getting cervical cancer. In these cases, your health care provider may recommend more frequent screening and Pap tests.  Colorectal cancer can be detected and often prevented. Most routine colorectal cancer screening begins at the age of  62 years and continues through age 69 years. However, your health care provider may recommend screening at an earlier age if you have risk factors for colon cancer. On a yearly basis, your health care provider may provide home test kits to check for hidden blood in the stool. Use of a small camera at the end of a tube, to directly examine the colon (sigmoidoscopy or colonoscopy), can detect the earliest forms of colorectal cancer. Talk to your health care provider about this at age 74, when routine screening begins.  Direct exam of the colon should be repeated every 5-10 years through age 13 years, unless early forms of pre-cancerous polyps or small growths are found.  Hepatitis C blood testing is recommended for all people born from [birth year] through 1965 and any individual with known risks for hepatitis C.  Pra  Osteoporosis is a disease in which the bones lose minerals and strength with aging. This can result in serious bone fractures or breaks. The risk of osteoporosis can be identified using a bone density scan. Women ages 22 years and over and women at risk for fractures or osteoporosis should discuss screening with their health care providers. Ask your health care provider whether you should take a calcium supplement or vitamin D to reduce the rate of osteoporosis.  Menopause can be associated with physical symptoms and risks. Hormone replacement therapy is available to decrease symptoms and risks. You should talk to your health care provider about whether hormone replacement therapy is right  for you.  Use sunscreen. Apply sunscreen liberally and repeatedly throughout the day. You should seek shade when your shadow is shorter than you. Protect yourself by wearing long sleeves, pants, a wide-brimmed hat, and sunglasses year round, whenever you are outdoors.  Once a month, do a whole body skin exam, using a mirror to look at the skin on your back. Tell your health care provider of new moles, moles that have irregular borders, moles that are larger than a pencil eraser, or moles that have changed in shape or color.  Stay current with required vaccines (immunizations).  Influenza vaccine. All adults should be immunized every year.  Tetanus, diphtheria, and acellular pertussis (Td, Tdap) vaccine. Pregnant women should receive 1 dose of Tdap vaccine during each pregnancy. The dose should be obtained regardless of the length of time since the last dose. Immunization is preferred during the 27th-36th week of gestation. An adult who has not previously received Tdap or who does not know her vaccine status should receive 1 dose of Tdap. This initial dose should be followed by tetanus and diphtheria toxoids (Td) booster doses every 10 years. Adults with an unknown or incomplete history of completing a 3-dose immunization series with Td-containing vaccines should begin or complete a primary immunization series including a Tdap dose. Adults should receive a Td booster every 10 years.  Varicella vaccine. An adult without evidence of immunity to varicella should receive 2 doses or a second dose if she has previously received 1 dose. Pregnant females who do not have evidence of immunity should receive the first dose after pregnancy. This first dose should be obtained before leaving the health care facility. The second dose should be obtained 4-8 weeks after the first dose.  Human papillomavirus (HPV) vaccine. Females aged 13-26 years who have not received the vaccine previously should obtain the 3-dose  series. The vaccine is not recommended for use in pregnant females. However, pregnancy testing is not needed before receiving a dose. If a  female is found to be pregnant after receiving a dose, no treatment is needed. In that case, the remaining doses should be delayed until after the pregnancy. Immunization is recommended for any person with an immunocompromised condition through the age of 40 years if she did not get any or all doses earlier. During the 3-dose series, the second dose should be obtained 4-8 weeks after the first dose. The third dose should be obtained 24 weeks after the first dose and 16 weeks after the second dose.  Zoster vaccine. One dose is recommended for adults aged 75 years or older unless certain conditions are present.  Measles, mumps, and rubella (MMR) vaccine. Adults born before [birth year] generally are considered immune to measles and mumps. Adults born in [birth year] or later should have 1 or more doses of MMR vaccine unless there is a contraindication to the vaccine or there is laboratory evidence of immunity to each of the three diseases. A routine second dose of MMR vaccine should be obtained at least 28 days after the first dose for students attending postsecondary schools, health care workers, or international travelers. People who received inactivated measles vaccine or an unknown type of measles vaccine during 1963-1967 should receive 2 doses of MMR vaccine. People who received inactivated mumps vaccine or an unknown type of mumps vaccine before 1979 and are at high risk for mumps infection should consider immunization with 2 doses of MMR vaccine. For females of childbearing age, rubella immunity should be determined. If there is no evidence of immunity, females who are not pregnant should be vaccinated. If there is no evidence of immunity, females who are pregnant should delay immunization until after pregnancy. Unvaccinated health care workers born before [birth year] who lack laboratory  evidence of measles, mumps, or rubella immunity or laboratory confirmation of disease should consider measles and mumps immunization with 2 doses of MMR vaccine or rubella immunization with 1 dose of MMR vaccine.  Pneumococcal 13-valent conjugate (PCV13) vaccine. When indicated, a person who is uncertain of her immunization history and has no record of immunization should receive the PCV13 vaccine. An adult aged 70 years or older who has certain medical conditions and has not been previously immunized should receive 1 dose of PCV13 vaccine. This PCV13 should be followed with a dose of pneumococcal polysaccharide (PPSV23) vaccine. The PPSV23 vaccine dose should be obtained at least 1 or more year(s) after the dose of PCV13 vaccine. An adult aged 52 years or older who has certain medical conditions and previously received 1 or more doses of PPSV23 vaccine should receive 1 dose of PCV13. The PCV13 vaccine dose should be obtained 1 or more years after the last PPSV23 vaccine dose.    Pneumococcal polysaccharide (PPSV23) vaccine. When PCV13 is also indicated, PCV13 should be obtained first. All adults aged 54 years and older should be immunized. An adult younger than age 59 years who has certain medical conditions should be immunized. Any person who resides in a nursing home or long-term care facility should be immunized. An adult smoker should be immunized. People with an immunocompromised condition and certain other conditions should receive both PCV13 and PPSV23 vaccines. People with human immunodeficiency virus (HIV) infection should be immunized as soon as possible after diagnosis. Immunization during chemotherapy or radiation therapy should be avoided. Routine use of PPSV23 vaccine is not recommended for American Indians, [place] Natives, or people younger than 65 years unless there are medical conditions that require PPSV23 vaccine. When indicated, people who have unknown immunization  and have no record of  immunization should receive PPSV23 vaccine. One-time revaccination 5 years after the first dose of PPSV23 is recommended for people aged 19-64 years who have chronic kidney failure, nephrotic syndrome, asplenia, or immunocompromised conditions. People who received 1-2 doses of PPSV23 before age 23 years should receive another dose of PPSV23 vaccine at age 94 years or later if at least 5 years have passed since the previous dose. Doses of PPSV23 are not needed for people immunized with PPSV23 at or after age 72 years.  Preventive Services / Frequency   Ages 61 to 35 years  Blood pressure check.  Lipid and cholesterol check.  Lung cancer screening. / Every year if you are aged 66-80 years and have a 30-pack-year history of smoking and currently smoke or have quit within the past 15 years. Yearly screening is stopped once you have quit smoking for at least 15 years or develop a health problem that would prevent you from having lung cancer treatment.  Clinical breast exam.** / Every year after age 70 years.   BRCA-related cancer risk assessment.** / For women who have family members with a BRCA-related cancer (breast, ovarian, tubal, or peritoneal cancers).  Mammogram.** / Every year beginning at age 66 years and continuing for as long as you are in good health. Consult with your health care provider.  Pap test.** / Every 3 years starting at age 85 years through age 8 or 40 years with a history of 3 consecutive normal Pap tests.  HPV screening.** / Every 3 years from ages 26 years through ages 18 to 19 years with a history of 3 consecutive normal Pap tests.  Fecal occult blood test (FOBT) of stool. / Every year beginning at age 16 years and continuing until age 61 years. You may not need to do this test if you get a colonoscopy every 10 years.  Flexible sigmoidoscopy or colonoscopy.** / Every 5 years for a flexible sigmoidoscopy or every 10 years for a colonoscopy beginning at age 36 years and  continuing until age 20 years.  Hepatitis C blood test.** / For all people born from [birth year] through 1964 and any individual with known risks for hepatitis C.  Skin self-exam. / Monthly.  Influenza vaccine. / Every year.  Tetanus, diphtheria, and acellular pertussis (Tdap/Td) vaccine.** / Consult your health care provider. Pregnant women should receive 1 dose of Tdap vaccine during each pregnancy. 1 dose of Td every 10 years.  Varicella vaccine.** / Consult your health care provider. Pregnant females who do not have evidence of immunity should receive the first dose after pregnancy.  Zoster vaccine.** / 1 dose for adults aged 53 years or older.  Pneumococcal 13-valent conjugate (PCV13) vaccine.** / Consult your health care provider.  Pneumococcal polysaccharide (PPSV23) vaccine.** / 1 to 2 doses if you smoke cigarettes or if you have certain conditions.  Meningococcal vaccine.** / Consult your health care provider.  Hepatitis A vaccine.** / Consult your health care provider.  Hepatitis B vaccine.** / Consult your health care provider. Screening for abdominal aortic aneurysm (AAA)  by ultrasound is recommended for people over 50 who have history of high blood pressure or who are current or former smokers. ++++++++++++++++++ Recommend Adult Low Dose Aspirin or  coated  Aspirin 81 mg daily  To reduce risk of Colon Cancer 40 %,  Skin Cancer 26 % ,  Melanoma 46%  and  Pancreatic cancer 60% +++++++++++++++++++ Vitamin D goal  is between 70-100.  Please make sure  that you are taking your Vitamin D as directed.  It is very important as a natural anti-inflammatory  helping hair, skin, and nails, as well as reducing stroke and heart attack risk.  It helps your bones and helps with mood. It also decreases numerous cancer risks so please take it as directed.  Low Vit D is associated with a 200-300% higher risk for CANCER  and 200-300% higher risk for HEART   ATTACK  &  STROKE.    .....................................Marland Kitchen It is also associated with higher death rate at younger ages,  autoimmune diseases like Rheumatoid arthritis, Lupus, Multiple Sclerosis.    Also many other serious conditions, like depression, Alzheimer's Dementia, infertility, muscle aches, fatigue, fibromyalgia - just to name a few. ++++++++++++++++++ Recommend the book "The END of DIETING" by Dr Excell Seltzer  & the book "The END of DIABETES " by Dr Excell Seltzer At [email or web address] - get book & Audio CD's    Being diabetic has a  300% increased risk for heart attack, stroke, cancer, and alzheimer- type vascular dementia. It is very important that you work harder with diet by avoiding all foods that are white. Avoid white rice (brown & wild rice is OK), white potatoes (sweetpotatoes in moderation is OK), White bread or wheat bread or anything made out of white flour like bagels, donuts, rolls, buns, biscuits, cakes, pastries, cookies, pizza crust, and pasta (made from white flour & egg whites) - vegetarian pasta or spinach or wheat pasta is OK. Multigrain breads like Arnold's or [employer], or multigrain sandwich thins or flatbreads.  Diet, exercise and weight loss can reverse and cure diabetes in the early stages.  Diet, exercise and weight loss is very important in the control and prevention of complications of diabetes which affects every system in your body, ie. Brain - dementia/stroke, eyes - glaucoma/blindness, heart - heart attack/heart failure, kidneys - dialysis, stomach - gastric paralysis, intestines - malabsorption, nerves - severe painful neuritis, circulation - gangrene & loss of a leg(s), and finally cancer and Alzheimers.    I recommend avoid fried & greasy foods,  sweets/candy, white rice (brown or wild rice or Quinoa is OK), white potatoes (sweet potatoes are OK) - anything made from white flour - bagels, doughnuts, rolls, buns, biscuits,white and wheat breads, pizza crust and traditional pasta  made of white flour & egg white(vegetarian pasta or spinach or wheat pasta is OK).  Multi-grain bread is OK - like multi-grain flat bread or sandwich thins. Avoid alcohol in excess. Exercise is also important.    Eat all the vegetables you want - avoid meat, especially red meat and dairy - especially cheese.  Cheese is the most concentrated form of trans-fats which is the worst thing to clog up our arteries. Veggie cheese is OK which can be found in the fresh produce section at [employer] or [employer] or [employer]  ++++++++++++++++++++++ DASH Eating Plan  DASH stands for "Dietary Approaches to Stop Hypertension."   The DASH eating plan is a healthy eating plan that has been shown to reduce high blood pressure (hypertension). Additional health benefits may include reducing the risk of type 2 diabetes mellitus, heart disease, and stroke. The DASH eating plan may also help with weight loss. WHAT DO I NEED TO KNOW ABOUT THE DASH EATING PLAN? For the DASH eating plan, you will follow these general guidelines:  Choose foods with a percent daily value for sodium of less than 5% (as listed on the food label).  Use  salt-free seasonings or herbs instead of table salt or sea salt.  Check with your health care provider or pharmacist before using salt substitutes.  Eat lower-sodium products, often labeled as "lower sodium" or "no salt added."  Eat fresh foods.  Eat more vegetables, fruits, and low-fat dairy products.  Choose whole grains. Look for the word "whole" as the first word in the ingredient list.  Choose fish   Limit sweets, desserts, sugars, and sugary drinks.  Choose heart-healthy fats.  Eat veggie cheese   Eat more home-cooked food and less restaurant, buffet, and fast food.  Limit fried foods.  Cook foods using methods other than frying.  Limit canned vegetables. If you do use them, rinse them well to decrease the sodium.  When eating at a restaurant, ask that your  food be prepared with less salt, or no salt if possible.                      WHAT FOODS CAN I EAT? Read Dr Fara Olden Fuhrman's books on The End of Dieting & The End of Diabetes  Grains Whole grain or whole wheat bread. Brown rice. Whole grain or whole wheat pasta. Quinoa, bulgur, and whole grain cereals. Low-sodium cereals. Corn or whole wheat flour tortillas. Whole grain cornbread. Whole grain crackers. Low-sodium crackers.  Vegetables Fresh or frozen vegetables (raw, steamed, roasted, or grilled). Low-sodium or reduced-sodium tomato and vegetable juices. Low-sodium or reduced-sodium tomato sauce and paste. Low-sodium or reduced-sodium canned vegetables.   Fruits All fresh, canned (in natural juice), or frozen fruits.  Protein Products  All fish and seafood.  Dried beans, peas, or lentils. Unsalted nuts and seeds. Unsalted canned beans.  Dairy Low-fat dairy products, such as skim or 1% milk, 2% or reduced-fat cheeses, low-fat ricotta or cottage cheese, or plain low-fat yogurt. Low-sodium or reduced-sodium cheeses.  Fats and Oils Tub margarines without trans fats. Light or reduced-fat mayonnaise and salad dressings (reduced sodium). Avocado. Safflower, olive, or canola oils. Natural peanut or almond butter.  Other Unsalted popcorn and pretzels. The items listed above may not be a complete list of recommended foods or beverages. Contact your dietitian for more options.  ++++++++++++++++++  WHAT FOODS ARE NOT RECOMMENDED? Grains/ White flour or wheat flour White bread. White pasta. White rice. Refined cornbread. Bagels and croissants. Crackers that contain trans fat.  Vegetables  Creamed or fried vegetables. Vegetables in a . Regular canned vegetables. Regular canned tomato sauce and paste. Regular tomato and vegetable juices.  Fruits Dried fruits. Canned fruit in light or heavy syrup. Fruit juice.  Meat and Other Protein Products Meat in general - RED meat & White meat.  Fatty  cuts of meat. Ribs, chicken wings, all processed meats as bacon, sausage, bologna, salami, fatback, hot dogs, bratwurst and packaged luncheon meats.  Dairy Whole or 2% milk, cream, half-and-half, and cream cheese. Whole-fat or sweetened yogurt. Full-fat cheeses or blue cheese. Non-dairy creamers and whipped toppings. Processed cheese, cheese spreads, or cheese curds.  Condiments Onion and garlic salt, seasoned salt, table salt, and sea salt. Canned and packaged gravies. Worcestershire sauce. Tartar sauce. Barbecue sauce. Teriyaki sauce. Soy sauce, including reduced sodium. Steak sauce. Fish sauce. Oyster sauce. Cocktail sauce. Horseradish. Ketchup and mustard. Meat flavorings and tenderizers. Bouillon cubes. Hot sauce. Tabasco sauce. Marinades. Taco seasonings. Relishes.  Fats and Oils Butter, stick margarine, lard, shortening and bacon fat. Coconut, palm kernel, or palm oils. Regular salad dressings.  Pickles and olives. Salted popcorn and pretzels.  The items listed above may not be a complete list of foods and beverages to avoid.

## 2019-07-22 ENCOUNTER — Encounter: Payer: Self-pay | Admitting: Adult Health Nurse Practitioner

## 2019-07-22 ENCOUNTER — Ambulatory Visit: Payer: 59 | Admitting: Adult Health Nurse Practitioner

## 2019-07-22 ENCOUNTER — Other Ambulatory Visit: Payer: Self-pay

## 2019-07-22 VITALS — BP 140/74 | HR 60 | Temp 97.7°F | Wt 174.0 lb

## 2019-07-22 DIAGNOSIS — C73 Malignant neoplasm of thyroid gland: Secondary | ICD-10-CM

## 2019-07-22 DIAGNOSIS — E559 Vitamin D deficiency, unspecified: Secondary | ICD-10-CM

## 2019-07-22 DIAGNOSIS — F17200 Nicotine dependence, unspecified, uncomplicated: Secondary | ICD-10-CM | POA: Diagnosis not present

## 2019-07-22 DIAGNOSIS — E782 Mixed hyperlipidemia: Secondary | ICD-10-CM | POA: Diagnosis not present

## 2019-07-22 DIAGNOSIS — Z79899 Other long term (current) drug therapy: Secondary | ICD-10-CM | POA: Diagnosis not present

## 2019-07-22 DIAGNOSIS — E039 Hypothyroidism, unspecified: Secondary | ICD-10-CM

## 2019-07-22 DIAGNOSIS — R7309 Other abnormal glucose: Secondary | ICD-10-CM | POA: Diagnosis not present

## 2019-07-22 DIAGNOSIS — I10 Essential (primary) hypertension: Secondary | ICD-10-CM

## 2019-07-23 LAB — CBC WITH DIFFERENTIAL/PLATELET
Absolute Monocytes: 440 cells/uL (ref 200–950)
Basophils Absolute: 39 cells/uL (ref 0–200)
Basophils Relative: 0.7 %
Eosinophils Absolute: 110 cells/uL (ref 15–500)
Eosinophils Relative: 2 %
HCT: 40.8 % (ref 35.0–45.0)
Hemoglobin: 13.4 g/dL (ref 11.7–15.5)
Lymphs Abs: 1463 cells/uL (ref 850–3900)
MCH: 31.5 pg (ref 27.0–33.0)
MCHC: 32.8 g/dL (ref 32.0–36.0)
MCV: 96 fL (ref 80.0–100.0)
MPV: 11.8 fL (ref 7.5–12.5)
Monocytes Relative: 8 %
Neutro Abs: 3449 cells/uL (ref 1500–7800)
Neutrophils Relative %: 62.7 %
Platelets: 241 10*3/uL (ref 140–400)
RBC: 4.25 10*6/uL (ref 3.80–5.10)
RDW: 12.7 % (ref 11.0–15.0)
Total Lymphocyte: 26.6 %
WBC: 5.5 10*3/uL (ref 3.8–10.8)

## 2019-07-23 LAB — LIPID PANEL
Cholesterol: 159 mg/dL (ref <200)
HDL: 73 mg/dL (ref >=50)
LDL Cholesterol (Calc): 73 mg/dL (calc)
Non-HDL Cholesterol (Calc): 86 mg/dL (calc) (ref <130)
Total CHOL/HDL Ratio: 2.2 (calc) (ref <5.0)
Triglycerides: 57 mg/dL (ref <150)

## 2019-07-23 LAB — COMPLETE METABOLIC PANEL WITH GFR
AG Ratio: 1.5 (calc) (ref 1.0–2.5)
ALT: 29 U/L (ref 6–29)
AST: 20 U/L (ref 10–35)
Albumin: 4 g/dL (ref 3.6–5.1)
Alkaline phosphatase (APISO): 71 U/L (ref 37–153)
BUN: 13 mg/dL (ref 7–25)
CO2: 33 mmol/L — ABNORMAL HIGH (ref 20–32)
Calcium: 9 mg/dL (ref 8.6–10.4)
Chloride: 103 mmol/L (ref 98–110)
Creat: 0.9 mg/dL (ref 0.50–0.99)
GFR, Est African American: 79 mL/min/{1.73_m2} (ref >=60)
GFR, Est Non African American: 69 mL/min/{1.73_m2} (ref >=60)
Globulin: 2.6 g/dL (calc) (ref 1.9–3.7)
Glucose, Bld: 99 mg/dL (ref 65–99)
Potassium: 4.6 mmol/L (ref 3.5–5.3)
Sodium: 140 mmol/L (ref 135–146)
Total Bilirubin: 0.4 mg/dL (ref 0.2–1.2)
Total Protein: 6.6 g/dL (ref 6.1–8.1)

## 2019-07-23 LAB — MAGNESIUM: Magnesium: 2.3 mg/dL (ref 1.5–2.5)

## 2019-07-23 LAB — HEMOGLOBIN A1C
Hgb A1c MFr Bld: 5.6 % of total Hgb (ref <5.7)
Mean Plasma Glucose: 114 (calc)
eAG (mmol/L): 6.3 (calc)

## 2019-07-23 LAB — TSH: TSH: 2.09 mIU/L (ref 0.40–4.50)

## 2019-08-03 DIAGNOSIS — Z79890 Hormone replacement therapy (postmenopausal): Secondary | ICD-10-CM | POA: Diagnosis not present

## 2019-08-03 DIAGNOSIS — Z1151 Encounter for screening for human papillomavirus (HPV): Secondary | ICD-10-CM | POA: Diagnosis not present

## 2019-08-03 DIAGNOSIS — Z6830 Body mass index (BMI) 30.0-30.9, adult: Secondary | ICD-10-CM | POA: Diagnosis not present

## 2019-08-03 DIAGNOSIS — Z1389 Encounter for screening for other disorder: Secondary | ICD-10-CM | POA: Diagnosis not present

## 2019-08-03 DIAGNOSIS — Z780 Asymptomatic menopausal state: Secondary | ICD-10-CM | POA: Diagnosis not present

## 2019-08-03 DIAGNOSIS — Z1231 Encounter for screening mammogram for malignant neoplasm of breast: Secondary | ICD-10-CM | POA: Diagnosis not present

## 2019-08-03 DIAGNOSIS — Z01419 Encounter for gynecological examination (general) (routine) without abnormal findings: Secondary | ICD-10-CM | POA: Diagnosis not present

## 2019-08-03 DIAGNOSIS — Z124 Encounter for screening for malignant neoplasm of cervix: Secondary | ICD-10-CM | POA: Diagnosis not present

## 2019-08-04 DIAGNOSIS — Z124 Encounter for screening for malignant neoplasm of cervix: Secondary | ICD-10-CM | POA: Diagnosis not present

## 2019-08-04 DIAGNOSIS — Z1151 Encounter for screening for human papillomavirus (HPV): Secondary | ICD-10-CM | POA: Diagnosis not present

## 2019-08-04 LAB — RESULTS CONSOLE HPV: CHL HPV: NEGATIVE

## 2019-08-09 MED FILL — ESTRADIOL 0.025 MG/24HR PTT: 0.025 | 84 days supply | Qty: 24 | Fill #0

## 2019-08-12 LAB — HM PAP SMEAR
HM Pap smear: NEGATIVE
HPV, high-risk: NEGATIVE

## 2019-08-19 MED FILL — BUPROPION HCL XL 300 MG TAB: 300 | 30 days supply | Qty: 30 | Fill #2

## 2019-08-19 MED FILL — EZETIMIBE 10 MG TABS: 10 | 90 days supply | Qty: 90 | Fill #1

## 2019-09-22 MED FILL — LEVOTHYROXINE 112 MCG TAB: 112 | 90 days supply | Qty: 90 | Fill #2

## 2019-09-22 MED FILL — ATENOLOL 100 MG TABLET: 100 | 90 days supply | Qty: 90 | Fill #2

## 2019-09-27 MED FILL — buPROPion HCL ER (XL) 300 M: 300 | 30 days supply | Qty: 30 | Fill #3

## 2019-09-28 MED FILL — OLMESARTAN MEDOXOMIL 40 MG: 40 | 90 days supply | Qty: 90 | Fill #2

## 2019-11-21 ENCOUNTER — Encounter: Payer: Self-pay | Admitting: Internal Medicine

## 2019-11-21 NOTE — Patient Instructions (Signed)
Due to recent changes in healthcare laws, you may see the results of your imaging and laboratory studies on MyChart before your provider has had a chance to review them.  We understand that in some cases there may be results that are confusing or concerning to you. Not all laboratory results come back in the same time frame and the provider may be waiting for multiple results in order to interpret others.  Please give [place] 48 hours in order for your provider to thoroughly review all the results before contacting the office for clarification of your results.   +++++++++++++++++++++++++++++++++++++++++++++++++++++  PLEASE   STOP BIOTIN  ! ! !    +++++++++++++++++++++++++++++++++++++++++++++++++++++  Vit D  & Vit C 1,000 mg   are recommended to help protect  against the Covid-19 and other Corona viruses.    Also it's recommended  to take  Zinc 50 mg  to help  protect against the Covid-19   and best place to get  is also on [email or web address]  and don't pay more than 6-8 cents /pill !  ================================ Coronavirus (COVID-19) Are you at risk?  Are you at risk for the Coronavirus (COVID-19)?  To be considered HIGH RISK for Coronavirus (COVID-19), you have to meet the following criteria:  . Traveled to [place], [place], [place], [place] or [place]; or in the [place] to [place], [place], [place]  . or [place]; and have fever, cough, and shortness of breath within the last 2 weeks of travel OR . Been in close contact with a person diagnosed with COVID-19 within the last 2 weeks and have  . fever, cough,and shortness of breath .  . IF YOU DO NOT MEET THESE CRITERIA, YOU ARE CONSIDERED LOW RISK FOR COVID-19.  What to do if you are HIGH RISK for COVID-19?  Marland Kitchen If you are having a medical emergency, call 911. . Seek medical care right away. Before you go to a doctor's office, urgent care or emergency department, .  call ahead and tell them about your recent travel, contact  with someone diagnosed with COVID-19  .  and your symptoms.  . You should receive instructions from your physician's office regarding next steps of care.  . When you arrive at healthcare provider, tell the healthcare staff immediately you have returned from  . visiting [place], [place], [place], [place] or [place]; or traveled in the [place] to [place], [place],  . [place] or [place] in the last two weeks or you have been in close contact with a person diagnosed with  . COVID-19 in the last 2 weeks.   . Tell the health care staff about your symptoms: fever, cough and shortness of breath. . After you have been seen by a medical provider, you will be either: o Tested for (COVID-19) and discharged home on quarantine except to seek medical care if  o symptoms worsen, and asked to  - Stay home and avoid contact with others until you get your results (4-5 days)  - Avoid travel on public transportation if possible (such as bus, train, or airplane) or o Sent to the Emergency Department by EMS for evaluation, COVID-19 testing  and  o possible admission depending on your condition and test results.  What to do if you are LOW RISK for COVID-19?  Reduce your risk of any infection by using the same precautions used for avoiding the common cold or flu:  Marland Kitchen Wash your hands often with soap and warm  water for at least 20 seconds.  If soap and water are not readily available,  . use an alcohol-based hand sanitizer with at least 60% alcohol.  . If coughing or sneezing, cover your mouth and nose by coughing or sneezing into the elbow areas of your shirt or coat, .  into a tissue or into your sleeve (not your hands). . Avoid shaking hands with others and consider head nods or verbal greetings only. . Avoid touching your eyes, nose, or mouth with unwashed hands.  . Avoid close contact with people who are sick. . Avoid places or events with large numbers of people in one location, like concerts or  sporting events. . Carefully consider travel plans you have or are making. . If you are planning any travel outside or inside the [place], visit the CDC's Travelers' Health webpage for the latest health notices. . If you have some symptoms but not all symptoms, continue to monitor at home and seek medical attention  . if your symptoms worsen. . If you are having a medical emergency, call 911. >>>>>>>>>>>>>>>>>>>>>>> Preventive Care for Adults  A healthy lifestyle and preventive care can promote health and wellness. Preventive health guidelines for women include the following key practices.  A routine yearly physical is a good way to check with your health care provider about your health and preventive screening. It is a chance to share any concerns and updates on your health and to receive a thorough exam.  Visit your dentist for a routine exam and preventive care every 6 months. Brush your teeth twice a day and floss once a day. Good oral hygiene prevents tooth decay and gum disease.  The frequency of eye exams is based on your age, health, family medical history, use of contact lenses, and other factors. Follow your health care provider's recommendations for frequency of eye exams.  Eat a healthy diet. Foods like vegetables, fruits, whole grains, low-fat dairy products, and lean protein foods contain the nutrients you need without too many calories. Decrease your intake of foods high in solid fats, added sugars, and salt. Eat the right amount of calories for you. Get information about a proper diet from your health care provider, if necessary.  Regular physical exercise is one of the most important things you can do for your health. Most adults should get at least 150 minutes of moderate-intensity exercise (any activity that increases your heart rate and causes you to sweat) each week. In addition, most adults need muscle-strengthening exercises on 2 or more days a week.  Maintain a healthy weight.  The body mass index (BMI) is a screening tool to identify possible weight problems. It provides an estimate of body fat based on height and weight. Your health care provider can find your BMI and can help you achieve or maintain a healthy weight. For adults 20 years and older:  A BMI below 18.5 is considered underweight.  A BMI of 18.5 to 24.9 is normal.  A BMI of 25 to 29.9 is considered overweight.  A BMI of 30 and above is considered obese.  Maintain normal blood lipids and cholesterol levels by exercising and minimizing your intake of saturated fat. Eat a balanced diet with plenty of fruit and vegetables. Blood tests for lipids and cholesterol should begin at age 41 and be repeated every 5 years. If your lipid or cholesterol levels are high, you are over 50, or you are at high risk for heart disease, you may need your cholesterol  levels checked more frequently. Ongoing high lipid and cholesterol levels should be treated with medicines if diet and exercise are not working.  If you smoke, find out from your health care provider how to quit. If you do not use tobacco, do not start.  Lung cancer screening is recommended for adults aged 6-80 years who are at high risk for developing lung cancer because of a history of smoking. A yearly low-dose CT scan of the lungs is recommended for people who have at least a 30-pack-year history of smoking and are a current smoker or have quit within the past 15 years. A pack year of smoking is smoking an average of 1 pack of cigarettes a day for 1 year (for example: 1 pack a day for 30 years or 2 packs a day for 15 years). Yearly screening should continue until the smoker has stopped smoking for at least 15 years. Yearly screening should be stopped for people who develop a health problem that would prevent them from having lung cancer treatment.  High blood pressure causes heart disease and increases the risk of stroke. Your blood pressure should be checked at  least every 1 to 2 years. Ongoing high blood pressure should be treated with medicines if weight loss and exercise do not work.  If you are 54-5 years old, ask your health care provider if you should take aspirin to prevent strokes.  Diabetes screening involves taking a blood sample to check your fasting blood sugar level. This should be done once every 3 years, after age 7, if you are within normal weight and without risk factors for diabetes. Testing should be considered at a younger age or be carried out more frequently if you are overweight and have at least 1 risk factor for diabetes.  Breast cancer screening is essential preventive care for women. You should practice "breast self-awareness." This means understanding the normal appearance and feel of your breasts and may include breast self-examination. Any changes detected, no matter how small, should be reported to a health care provider. Women in their 71s and 30s should have a clinical breast exam (CBE) by a health care provider as part of a regular health exam every 1 to 3 years. After age 29, women should have a CBE every year. Starting at age 88, women should consider having a mammogram (breast X-ray test) every year. Women who have a family history of breast cancer should talk to their health care provider about genetic screening. Women at a high risk of breast cancer should talk to their health care providers about having an MRI and a mammogram every year.  Breast cancer gene (BRCA)-related cancer risk assessment is recommended for women who have family members with BRCA-related cancers. BRCA-related cancers include breast, ovarian, tubal, and peritoneal cancers. Having family members with these cancers may be associated with an increased risk for harmful changes (mutations) in the breast cancer genes BRCA1 and BRCA2. Results of the assessment will determine the need for genetic counseling and BRCA1 and BRCA2 testing.  Routine pelvic exams  to screen for cancer are no longer recommended for nonpregnant women who are considered low risk for cancer of the pelvic organs (ovaries, uterus, and vagina) and who do not have symptoms. Ask your health care provider if a screening pelvic exam is right for you.  If you have had past treatment for cervical cancer or a condition that could lead to cancer, you need Pap tests and screening for cancer for at least 20  years after your treatment. If Pap tests have been discontinued, your risk factors (such as having a new sexual partner) need to be reassessed to determine if screening should be resumed. Some women have medical problems that increase the chance of getting cervical cancer. In these cases, your health care provider may recommend more frequent screening and Pap tests.  Colorectal cancer can be detected and often prevented. Most routine colorectal cancer screening begins at the age of 10 years and continues through age 53 years. However, your health care provider may recommend screening at an earlier age if you have risk factors for colon cancer. On a yearly basis, your health care provider may provide home test kits to check for hidden blood in the stool. Use of a small camera at the end of a tube, to directly examine the colon (sigmoidoscopy or colonoscopy), can detect the earliest forms of colorectal cancer. Talk to your health care provider about this at age 35, when routine screening begins.  Direct exam of the colon should be repeated every 5-10 years through age 81 years, unless early forms of pre-cancerous polyps or small growths are found.  Hepatitis C blood testing is recommended for all people born from [birth year] through 1965 and any individual with known risks for hepatitis C.  Pra  Osteoporosis is a disease in which the bones lose minerals and strength with aging. This can result in serious bone fractures or breaks. The risk of osteoporosis can be identified using a bone density scan. Women  ages 73 years and over and women at risk for fractures or osteoporosis should discuss screening with their health care providers. Ask your health care provider whether you should take a calcium supplement or vitamin D to reduce the rate of osteoporosis.  Menopause can be associated with physical symptoms and risks. Hormone replacement therapy is available to decrease symptoms and risks. You should talk to your health care provider about whether hormone replacement therapy is right for you.  Use sunscreen. Apply sunscreen liberally and repeatedly throughout the day. You should seek shade when your shadow is shorter than you. Protect yourself by wearing long sleeves, pants, a wide-brimmed hat, and sunglasses year round, whenever you are outdoors.  Once a month, do a whole body skin exam, using a mirror to look at the skin on your back. Tell your health care provider of new moles, moles that have irregular borders, moles that are larger than a pencil eraser, or moles that have changed in shape or color.  Stay current with required vaccines (immunizations).  Influenza vaccine. All adults should be immunized every year.  Tetanus, diphtheria, and acellular pertussis (Td, Tdap) vaccine. Pregnant women should receive 1 dose of Tdap vaccine during each pregnancy. The dose should be obtained regardless of the length of time since the last dose. Immunization is preferred during the 27th-36th week of gestation. An adult who has not previously received Tdap or who does not know her vaccine status should receive 1 dose of Tdap. This initial dose should be followed by tetanus and diphtheria toxoids (Td) booster doses every 10 years. Adults with an unknown or incomplete history of completing a 3-dose immunization series with Td-containing vaccines should begin or complete a primary immunization series including a Tdap dose. Adults should receive a Td booster every 10 years.  Varicella vaccine. An adult without  evidence of immunity to varicella should receive 2 doses or a second dose if she has previously received 1 dose. Pregnant females who do not  have evidence of immunity should receive the first dose after pregnancy. This first dose should be obtained before leaving the health care facility. The second dose should be obtained 4-8 weeks after the first dose.  Human papillomavirus (HPV) vaccine. Females aged 13-26 years who have not received the vaccine previously should obtain the 3-dose series. The vaccine is not recommended for use in pregnant females. However, pregnancy testing is not needed before receiving a dose. If a female is found to be pregnant after receiving a dose, no treatment is needed. In that case, the remaining doses should be delayed until after the pregnancy. Immunization is recommended for any person with an immunocompromised condition through the age of 66 years if she did not get any or all doses earlier. During the 3-dose series, the second dose should be obtained 4-8 weeks after the first dose. The third dose should be obtained 24 weeks after the first dose and 16 weeks after the second dose.  Zoster vaccine. One dose is recommended for adults aged 10 years or older unless certain conditions are present.  Measles, mumps, and rubella (MMR) vaccine. Adults born before [birth year] generally are considered immune to measles and mumps. Adults born in [birth year] or later should have 1 or more doses of MMR vaccine unless there is a contraindication to the vaccine or there is laboratory evidence of immunity to each of the three diseases. A routine second dose of MMR vaccine should be obtained at least 28 days after the first dose for students attending postsecondary schools, health care workers, or international travelers. People who received inactivated measles vaccine or an unknown type of measles vaccine during 1963-1967 should receive 2 doses of MMR vaccine. People who received inactivated mumps vaccine or  an unknown type of mumps vaccine before 1979 and are at high risk for mumps infection should consider immunization with 2 doses of MMR vaccine. For females of childbearing age, rubella immunity should be determined. If there is no evidence of immunity, females who are not pregnant should be vaccinated. If there is no evidence of immunity, females who are pregnant should delay immunization until after pregnancy. Unvaccinated health care workers born before [birth year] who lack laboratory evidence of measles, mumps, or rubella immunity or laboratory confirmation of disease should consider measles and mumps immunization with 2 doses of MMR vaccine or rubella immunization with 1 dose of MMR vaccine.  Pneumococcal 13-valent conjugate (PCV13) vaccine. When indicated, a person who is uncertain of her immunization history and has no record of immunization should receive the PCV13 vaccine. An adult aged 40 years or older who has certain medical conditions and has not been previously immunized should receive 1 dose of PCV13 vaccine. This PCV13 should be followed with a dose of pneumococcal polysaccharide (PPSV23) vaccine. The PPSV23 vaccine dose should be obtained at least 1 or more year(s) after the dose of PCV13 vaccine. An adult aged 57 years or older who has certain medical conditions and previously received 1 or more doses of PPSV23 vaccine should receive 1 dose of PCV13. The PCV13 vaccine dose should be obtained 1 or more years after the last PPSV23 vaccine dose.    Pneumococcal polysaccharide (PPSV23) vaccine. When PCV13 is also indicated, PCV13 should be obtained first. All adults aged 83 years and older should be immunized. An adult younger than age 71 years who has certain medical conditions should be immunized. Any person who resides in a nursing home or long-term care facility should be immunized. An adult smoker should  be immunized. People with an immunocompromised condition and certain other conditions should  receive both PCV13 and PPSV23 vaccines. People with human immunodeficiency virus (HIV) infection should be immunized as soon as possible after diagnosis. Immunization during chemotherapy or radiation therapy should be avoided. Routine use of PPSV23 vaccine is not recommended for American Indians, Learned Natives, or people younger than 65 years unless there are medical conditions that require PPSV23 vaccine. When indicated, people who have unknown immunization and have no record of immunization should receive PPSV23 vaccine. One-time revaccination 5 years after the first dose of PPSV23 is recommended for people aged 19-64 years who have chronic kidney failure, nephrotic syndrome, asplenia, or immunocompromised conditions. People who received 1-2 doses of PPSV23 before age 10 years should receive another dose of PPSV23 vaccine at age 63 years or later if at least 5 years have passed since the previous dose. Doses of PPSV23 are not needed for people immunized with PPSV23 at or after age 31 years.  Preventive Services / Frequency   Ages 33 to 55 years  Blood pressure check.  Lipid and cholesterol check.  Lung cancer screening. / Every year if you are aged 61-80 years and have a 30-pack-year history of smoking and currently smoke or have quit within the past 15 years. Yearly screening is stopped once you have quit smoking for at least 15 years or develop a health problem that would prevent you from having lung cancer treatment.  Clinical breast exam.** / Every year after age 59 years.   BRCA-related cancer risk assessment.** / For women who have family members with a BRCA-related cancer (breast, ovarian, tubal, or peritoneal cancers).  Mammogram.** / Every year beginning at age 68 years and continuing for as long as you are in good health. Consult with your health care provider.  Pap test.** / Every 3 years starting at age 81 years through age 45 or 43 years with a history of 3 consecutive normal Pap  tests.  HPV screening.** / Every 3 years from ages 64 years through ages 93 to 10 years with a history of 3 consecutive normal Pap tests.  Fecal occult blood test (FOBT) of stool. / Every year beginning at age 18 years and continuing until age 26 years. You may not need to do this test if you get a colonoscopy every 10 years.  Flexible sigmoidoscopy or colonoscopy.** / Every 5 years for a flexible sigmoidoscopy or every 10 years for a colonoscopy beginning at age 52 years and continuing until age 55 years.  Hepatitis C blood test.** / For all people born from [birth year] through 1964 and any individual with known risks for hepatitis C.  Skin self-exam. / Monthly.  Influenza vaccine. / Every year.  Tetanus, diphtheria, and acellular pertussis (Tdap/Td) vaccine.** / Consult your health care provider. Pregnant women should receive 1 dose of Tdap vaccine during each pregnancy. 1 dose of Td every 10 years.  Varicella vaccine.** / Consult your health care provider. Pregnant females who do not have evidence of immunity should receive the first dose after pregnancy.  Zoster vaccine.** / 1 dose for adults aged 66 years or older.  Pneumococcal 13-valent conjugate (PCV13) vaccine.** / Consult your health care provider.  Pneumococcal polysaccharide (PPSV23) vaccine.** / 1 to 2 doses if you smoke cigarettes or if you have certain conditions.  Meningococcal vaccine.** / Consult your health care provider.  Hepatitis A vaccine.** / Consult your health care provider.  Hepatitis B vaccine.** / Consult your health care provider. Screening  for abdominal aortic aneurysm (AAA)  by ultrasound is recommended for people over 50 who have history of high blood pressure or who are current or former smokers. ++++++++++++++++++ Recommend Adult Low Dose Aspirin or  coated  Aspirin 81 mg daily  To reduce risk of Colon Cancer 40 %,  Skin Cancer 26 % ,  Melanoma 46%  and  Pancreatic cancer  60% +++++++++++++++++++ Vitamin D goal  is between 70-100.  Please make sure that you are taking your Vitamin D as directed.  It is very important as a natural anti-inflammatory  helping hair, skin, and nails, as well as reducing stroke and heart attack risk.  It helps your bones and helps with mood. It also decreases numerous cancer risks so please take it as directed.  Low Vit D is associated with a 200-300% higher risk for CANCER  and 200-300% higher risk for HEART   ATTACK  &  STROKE.   .....................................Marland Kitchen It is also associated with higher death rate at younger ages,  autoimmune diseases like Rheumatoid arthritis, Lupus, Multiple Sclerosis.    Also many other serious conditions, like depression, Alzheimer's Dementia, infertility, muscle aches, fatigue, fibromyalgia - just to name a few. ++++++++++++++++++ Recommend the book "The END of DIETING" by Dr Excell Seltzer  & the book "The END of DIABETES " by Dr Excell Seltzer At [email or web address] - get book & Audio CD's    Being diabetic has a  300% increased risk for heart attack, stroke, cancer, and alzheimer- type vascular dementia. It is very important that you work harder with diet by avoiding all foods that are white. Avoid white rice (brown & wild rice is OK), white potatoes (sweetpotatoes in moderation is OK), White bread or wheat bread or anything made out of white flour like bagels, donuts, rolls, buns, biscuits, cakes, pastries, cookies, pizza crust, and pasta (made from white flour & egg whites) - vegetarian pasta or spinach or wheat pasta is OK. Multigrain breads like Arnold's or [employer], or multigrain sandwich thins or flatbreads.  Diet, exercise and weight loss can reverse and cure diabetes in the early stages.  Diet, exercise and weight loss is very important in the control and prevention of complications of diabetes which affects every system in your body, ie. Brain - dementia/stroke, eyes - glaucoma/blindness,  heart - heart attack/heart failure, kidneys - dialysis, stomach - gastric paralysis, intestines - malabsorption, nerves - severe painful neuritis, circulation - gangrene & loss of a leg(s), and finally cancer and Alzheimers.    I recommend avoid fried & greasy foods,  sweets/candy, white rice (brown or wild rice or Quinoa is OK), white potatoes (sweet potatoes are OK) - anything made from white flour - bagels, doughnuts, rolls, buns, biscuits,white and wheat breads, pizza crust and traditional pasta made of white flour & egg white(vegetarian pasta or spinach or wheat pasta is OK).  Multi-grain bread is OK - like multi-grain flat bread or sandwich thins. Avoid alcohol in excess. Exercise is also important.    Eat all the vegetables you want - avoid meat, especially red meat and dairy - especially cheese.  Cheese is the most concentrated form of trans-fats which is the worst thing to clog up our arteries. Veggie cheese is OK which can be found in the fresh produce section at [employer] or [employer] or [employer]  ++++++++++++++++++++++ DASH Eating Plan  DASH stands for "Dietary Approaches to Stop Hypertension."   The DASH eating plan is a healthy eating plan that has been  shown to reduce high blood pressure (hypertension). Additional health benefits may include reducing the risk of type 2 diabetes mellitus, heart disease, and stroke. The DASH eating plan may also help with weight loss. WHAT DO I NEED TO KNOW ABOUT THE DASH EATING PLAN? For the DASH eating plan, you will follow these general guidelines:  Choose foods with a percent daily value for sodium of less than 5% (as listed on the food label).  Use salt-free seasonings or herbs instead of table salt or sea salt.  Check with your health care provider or pharmacist before using salt substitutes.  Eat lower-sodium products, often labeled as "lower sodium" or "no salt added."  Eat fresh foods.  Eat more vegetables, fruits, and low-fat  dairy products.  Choose whole grains. Look for the word "whole" as the first word in the ingredient list.  Choose fish   Limit sweets, desserts, sugars, and sugary drinks.  Choose heart-healthy fats.  Eat veggie cheese   Eat more home-cooked food and less restaurant, buffet, and fast food.  Limit fried foods.  Cook foods using methods other than frying.  Limit canned vegetables. If you do use them, rinse them well to decrease the sodium.  When eating at a restaurant, ask that your food be prepared with less salt, or no salt if possible.                      WHAT FOODS CAN I EAT? Read Dr Fara Olden Fuhrman's books on The End of Dieting & The End of Diabetes  Grains Whole grain or whole wheat bread. Brown rice. Whole grain or whole wheat pasta. Quinoa, bulgur, and whole grain cereals. Low-sodium cereals. Corn or whole wheat flour tortillas. Whole grain cornbread. Whole grain crackers. Low-sodium crackers.  Vegetables Fresh or frozen vegetables (raw, steamed, roasted, or grilled). Low-sodium or reduced-sodium tomato and vegetable juices. Low-sodium or reduced-sodium tomato sauce and paste. Low-sodium or reduced-sodium canned vegetables.   Fruits All fresh, canned (in natural juice), or frozen fruits.  Protein Products  All fish and seafood.  Dried beans, peas, or lentils. Unsalted nuts and seeds. Unsalted canned beans.  Dairy Low-fat dairy products, such as skim or 1% milk, 2% or reduced-fat cheeses, low-fat ricotta or cottage cheese, or plain low-fat yogurt. Low-sodium or reduced-sodium cheeses.  Fats and Oils Tub margarines without trans fats. Light or reduced-fat mayonnaise and salad dressings (reduced sodium). Avocado. Safflower, olive, or canola oils. Natural peanut or almond butter.  Other Unsalted popcorn and pretzels. The items listed above may not be a complete list of recommended foods or beverages. Contact your dietitian for more options.  ++++++++++++++++++  WHAT  FOODS ARE NOT RECOMMENDED? Grains/ White flour or wheat flour White bread. White pasta. White rice. Refined cornbread. Bagels and croissants. Crackers that contain trans fat.  Vegetables  Creamed or fried vegetables. Vegetables in a . Regular canned vegetables. Regular canned tomato sauce and paste. Regular tomato and vegetable juices.  Fruits Dried fruits. Canned fruit in light or heavy syrup. Fruit juice.  Meat and Other Protein Products Meat in general - RED meat & White meat.  Fatty cuts of meat. Ribs, chicken wings, all processed meats as bacon, sausage, bologna, salami, fatback, hot dogs, bratwurst and packaged luncheon meats.  Dairy Whole or 2% milk, cream, half-and-half, and cream cheese. Whole-fat or sweetened yogurt. Full-fat cheeses or blue cheese. Non-dairy creamers and whipped toppings. Processed cheese, cheese spreads, or cheese curds.  Condiments Onion and garlic salt, seasoned  salt, table salt, and sea salt. Canned and packaged gravies. Worcestershire sauce. Tartar sauce. Barbecue sauce. Teriyaki sauce. Soy sauce, including reduced sodium. Steak sauce. Fish sauce. Oyster sauce. Cocktail sauce. Horseradish. Ketchup and mustard. Meat flavorings and tenderizers. Bouillon cubes. Hot sauce. Tabasco sauce. Marinades. Taco seasonings. Relishes.  Fats and Oils Butter, stick margarine, lard, shortening and bacon fat. Coconut, palm kernel, or palm oils. Regular salad dressings.  Pickles and olives. Salted popcorn and pretzels.  The items listed above may not be a complete list of foods and beverages to avoid.

## 2019-11-21 NOTE — Progress Notes (Signed)
Annual Screening/Preventative Visit & Comprehensive Evaluation &  Examination     This very nice 64 y.o.  DBF presents for a Screening /Preventative Visit & comprehensive evaluation and management of multiple medical co-morbidities.  Patient has been followed for HTN, HLD, Hypothyroidism, Prediabetes  and Vitamin D Deficiency.   [[[ (copy) Patient had been dx'd in the past w/ Pseudotumor cerebri consequent of a HA evaluated &LP with sl elevated CSF OP (275 mm)  &was treated w/Diamox. 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  Patient's BP has been controlled at home and patient denies any cardiac symptoms as chest pain, palpitations, shortness of breath, dizziness or ankle swelling. Today's BP  Is borderline -  138/86.      Patient's hyperlipidemia is controlled with diet and medications. Patient denies myalgias or other medication SE's. Last lipids were at goal:  Lab Results  Component Value Date   CHOL 159 07/22/2019   HDL 73 07/22/2019   LDLCALC 73 07/22/2019   TRIG 57 07/22/2019   CHOLHDL 2.2 07/22/2019       Patient has hx/o prediabetes (A1c 5.8% / 2011)  and patient denies reactive hypoglycemic symptoms, visual blurring, diabetic polys or paresthesias. 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 She has been on supressive therapy since.      Finally, patient has history of Vitamin D Deficiency and last Vitamin D was at goal:  Lab Results  Component Value Date   VD25OH 16 04/19/2019    Current Outpatient Medications on File Prior to Visit  Medication  Sig  . aspirin 81 MG tablet Take 81 mg by mouth daily.    Marland Kitchen atenolol (TENORMIN) 100 MG tablet TAKE 1/2 TO 1 TABLET BY MOUTH DAILY FOR BLOOD PRESSURE  . Biotin 5000 MCG TABS Take by mouth daily.  Marland Kitchen buPROPion (WELLBUTRIN XL) 300 MG 24 hr tablet Take 1 tablet Daily for Mood, Focus & Concentration  . Cholecalciferol (VITAMIN D) 2000 units CAPS take 3 caps (6,000 units) / daily (Patient taking differently: 5,000 Units daily. )  . CINNAMON PO Take 1,000 mg by mouth 2 (two) times daily.   . Cyanocobalamin (VITAMIN B-12) 1000 MCG SUBL Place 1 tablet under the tongue daily.  Kendall Flack 575 MG/5ML SYRP Take by mouth daily.  Marland Kitchen ezetimibe (ZETIA) 10 MG tablet Take 1 tablet Daily for  Cholesterol  . fexofenadine (ALLEGRA) 180 MG tablet Take 180 mg by mouth daily. Patient OTC Allegra PRN  . fluticasone (FLONASE) 50 MCG/ACT nasal spray PLACE 2 SPRAYS INTO BOTH NOSTRILS DAILY.  . furosemide (LASIX) 40 MG tablet Take 1 tablet daily for BP & Fluid Retention  . levothyroxine (SYNTHROID) 112 MCG tablet TAKE 1 TABLET BY MOUTH ONCE DAILY FOR THYROID  . Magnesium 500 MG CAPS Take by mouth daily.  Marland Kitchen MINIVELLE 0.025  MG/24HR   . Multiple Vitamins-Minerals (HAIR/SKIN/NAILS PO) Take by mouth daily. 3 tablets daily  . olmesartan (BENICAR) 40 MG tablet Take 1 tablet Daily for BP (Patient taking differently: Take 1/2 tablet Daily for BP)  . Omega-3 Fatty Acids (FISH OIL) 1000 MG CAPS Take by mouth daily.  . rosuvastatin (CRESTOR) 40 MG tablet TAKE 1/2 TO 1 TABLET BY MOUTH DAILY OR AS DIRECTED FOR CHOLESTEROL  . vitamin E 400 UNIT capsule Take 400 Units by mouth daily.  . Zinc 25 MG TABS Take by mouth daily.   No current facility-administered medications on file prior to visit.   Allergies  Allergen Reactions  . Erythromycin Nausea And Vomiting and Other (See Comments)    Diarrhea - more severe   Past Medical History:  Diagnosis Date  . Anxiety disorder   . Cancer ([place])    thyroid  . GERD (gastroesophageal  reflux disease)   . History of thyroid cancer   . Hypertension   . Migraine headache   . Pseudopapilledema of both optic discs    bilateral  . Vitamin D deficiency    Health Maintenance  Topic Date Due  . COVID-19 Vaccine (1) Never done  . PAP SMEAR-Modifier  04/18/2018  . MAMMOGRAM  06/02/2019  . INFLUENZA VACCINE  02/13/2020  . TETANUS/TDAP  05/02/2025  . COLONOSCOPY  08/12/2027  . Hepatitis C Screening  Completed  . HIV Screening  Completed   Immunization History  Administered Date(s) Administered  . DTaP 12/14/1994  . Hepatitis B 07/15/1989  . PPD Test 07/18/2014, 08/04/2015, 10/05/2018  . Pneumococcal Polysaccharide-23 10/09/1998  . Tdap 05/03/2015    Last Colon - 08/11/2017 - Dr Collene Mares - recommended 10 yr f/u due Feb 2029  Last MGM  & Pap Jan 2021 - Dr Raliegh Ip Richardson's office  Past Surgical History:  Procedure Laterality Date  . ABDOMINAL HYSTERECTOMY  2005  . THYROIDECTOMY  04/25/11   Family History  Problem Relation Age of Onset  . Diabetes Mother   . Heart disease Mother   . Hypertension Mother   . Hyperlipidemia Mother   . COPD Mother   . Depression Father   . Suicidality Father   . Stroke Brother   . Heart disease Brother   . Asthma Son   . Cancer Maternal Aunt        lung   Social History   Tobacco Use  . Smoking status: Light Tobacco Smoker    Packs/day: 0.20    Types: Cigarettes  . Smokeless tobacco: Never Used  . Tobacco comment: 1-2 cigarettes per day  Substance Use Topics  . Alcohol use: Yes    Comment: weekends  . Drug use: No    ROS Constitutional: Denies fever, chills, weight loss/gain, headaches, insomnia,  night sweats, and change in appetite. Does c/o fatigue. Eyes: Denies redness, blurred vision, diplopia, discharge, itchy, watery eyes.  ENT: Denies discharge, congestion, post nasal drip, epistaxis, sore throat, earache, hearing loss, dental pain, Tinnitus, Vertigo, Sinus pain, snoring.  Cardio: Denies chest pain, palpitations,  irregular heartbeat, syncope, dyspnea, diaphoresis, orthopnea, PND, claudication, edema Respiratory: denies cough, dyspnea, DOE, pleurisy, hoarseness, laryngitis, wheezing.  Gastrointestinal: Denies dysphagia, heartburn, reflux, water brash, pain, cramps, nausea, vomiting, bloating, diarrhea, constipation, hematemesis, melena, hematochezia, jaundice, hemorrhoids Genitourinary: Denies dysuria, frequency, urgency, nocturia, hesitancy, discharge, hematuria, flank pain Breast: Breast lumps, nipple discharge, bleeding.  Musculoskeletal: Denies arthralgia, myalgia, stiffness, Jt. Swelling, pain, limp, and strain/sprain. Denies falls. Skin: Denies puritis, rash, hives, warts, acne, eczema, changing in  skin lesion Neuro: No weakness, tremor, incoordination, spasms, paresthesia, pain Psychiatric: Denies confusion, memory loss, sensory loss. Denies Depression. Endocrine: Denies change in weight, skin, hair change, nocturia, and paresthesia, diabetic polys, visual blurring, hyper / hypo glycemic episodes.  Heme/Lymph: No excessive bleeding, bruising, enlarged lymph nodes.  Physical Exam  BP 138/86   Pulse 72   Temp (!) 97.2 F (36.2 C)   Resp 16   Ht 5' 2.5" (1.588 m)   Wt 175 lb (79.4 kg)   BMI 31.50 kg/m   General Appearance: Over nourished, well groomed and in no apparent distress.  Eyes: PERRLA, EOMs, conjunctiva no swelling or erythema, normal fundi and vessels. Sinuses: No frontal/maxillary tenderness ENT/Mouth: EACs patent / TMs  nl. Nares clear without erythema, swelling, mucoid exudates. Oral hygiene is good. No erythema, swelling, or exudate. Tongue normal, non-obstructing. Tonsils not swollen or erythematous. Hearing normal.  Neck: Supple, thyroid not palpable. No bruits, nodes or JVD. Respiratory: Respiratory effort normal.  BS equal and clear bilateral without rales, rhonci, wheezing or stridor. Cardio: Heart sounds are normal with regular rate and rhythm and no murmurs, rubs or  gallops. Peripheral pulses are normal and equal bilaterally without edema. No aortic or femoral bruits. Chest: symmetric with normal excursions and percussion. Breasts: Symmetric, without lumps, nipple discharge, retractions, or fibrocystic changes.  Abdomen: Flat, soft with bowel sounds active. Nontender, no guarding, rebound, hernias, masses, or organomegaly.  Lymphatics: Non tender without lymphadenopathy.  Genitourinary:  Musculoskeletal: Full ROM all peripheral extremities, joint stability, 5/5 strength, and normal gait. Skin: Warm and dry without rashes, lesions, cyanosis, clubbing or  ecchymosis.  Neuro: Cranial nerves intact, reflexes equal bilaterally. Normal muscle tone, no cerebellar symptoms. Sensation intact.  Pysch: Alert and oriented X 3, normal affect, Insight and Judgment appropriate.   Assessment and Plan  1. Annual Preventative Screening Examination  2. Essential hypertension  - EKG 12-Lead - [place], RETROPERITNL ABD,  LTD - Urinalysis, Routine w reflex microscopic - Microalbumin / creatinine urine ratio - CBC with Differential/Platelet - COMPLETE METABOLIC PANEL WITH GFR - Magnesium - TSH  3. Hyperlipidemia, mixed  - EKG 12-Lead - [place], RETROPERITNL ABD,  LTD - Lipid panel - TSH  4. Abnormal glucose  - EKG 12-Lead - [place], RETROPERITNL ABD,  LTD - Hemoglobin A1c - Insulin, random  5. Vitamin D deficiency  - VITAMIN D 25 Hydroxy  6. Prediabetes  - EKG 12-Lead - [place], RETROPERITNL ABD,  LTD - Hemoglobin A1c - Insulin, random  7. Hypothyroidism  - TSH  8. History of thyroid cancer  - TSH  9. Screening-pulmonary TB  - TB Skin Test  10. Screening for colorectal cancer  - POC Hemoccult Bld/Stl   11. Screening for ischemic heart disease  - EKG 12-Lead - Lipid panel  12. FHx: heart disease  - EKG 12-Lead - [place], RETROPERITNL ABD,  LTD  13. Smoker   - EKG 12-Lead - [place], RETROPERITNL ABD,  LTD  14. Screening for AAA (aortic abdominal  aneurysm)  - [place], RETROPERITNL ABD,  LTD  15. Fatigue, unspecified type  - Iron,Total/Total Iron Binding Cap - Vitamin B12 - CBC with Differential/Platelet - TSH  16. Medication management  - Urinalysis, Routine w reflex microscopic - Microalbumin / creatinine urine ratio - CBC with Differential/Platelet - COMPLETE METABOLIC PANEL WITH GFR - Magnesium - Lipid panel - TSH - Hemoglobin A1c - Insulin, random - VITAMIN D 25 Hydroxy        Patient was counseled in prudent diet to  achieve /maintain BMI less than 25 for weight control (& given an Rx for Phentermine / Topiramate for weight loss), BP monitoring, regular exercise and medications. Discussed med's effects and SE's. Screening labs and tests as requested with regular follow-up as recommended. Over 40 minutes of exam, counseling, chart review and high complex critical decision making was performed.   Kirtland Bouchard, MD

## 2019-11-22 ENCOUNTER — Other Ambulatory Visit: Payer: Self-pay

## 2019-11-22 ENCOUNTER — Ambulatory Visit (INDEPENDENT_AMBULATORY_CARE_PROVIDER_SITE_OTHER): Payer: 59 | Admitting: Internal Medicine

## 2019-11-22 VITALS — BP 138/86 | HR 72 | Temp 97.2°F | Resp 16 | Ht 62.5 in | Wt 175.0 lb

## 2019-11-22 DIAGNOSIS — Z136 Encounter for screening for cardiovascular disorders: Secondary | ICD-10-CM | POA: Diagnosis not present

## 2019-11-22 DIAGNOSIS — Z0000 Encounter for general adult medical examination without abnormal findings: Secondary | ICD-10-CM

## 2019-11-22 DIAGNOSIS — R7303 Prediabetes: Secondary | ICD-10-CM

## 2019-11-22 DIAGNOSIS — Z85850 Personal history of malignant neoplasm of thyroid: Secondary | ICD-10-CM

## 2019-11-22 DIAGNOSIS — Z8249 Family history of ischemic heart disease and other diseases of the circulatory system: Secondary | ICD-10-CM

## 2019-11-22 DIAGNOSIS — E039 Hypothyroidism, unspecified: Secondary | ICD-10-CM

## 2019-11-22 DIAGNOSIS — Z111 Encounter for screening for respiratory tuberculosis: Secondary | ICD-10-CM

## 2019-11-22 DIAGNOSIS — E782 Mixed hyperlipidemia: Secondary | ICD-10-CM

## 2019-11-22 DIAGNOSIS — E6609 Other obesity due to excess calories: Secondary | ICD-10-CM

## 2019-11-22 DIAGNOSIS — E559 Vitamin D deficiency, unspecified: Secondary | ICD-10-CM | POA: Diagnosis not present

## 2019-11-22 DIAGNOSIS — F17200 Nicotine dependence, unspecified, uncomplicated: Secondary | ICD-10-CM

## 2019-11-22 DIAGNOSIS — Z0001 Encounter for general adult medical examination with abnormal findings: Secondary | ICD-10-CM

## 2019-11-22 DIAGNOSIS — R5383 Other fatigue: Secondary | ICD-10-CM

## 2019-11-22 DIAGNOSIS — Z1211 Encounter for screening for malignant neoplasm of colon: Secondary | ICD-10-CM

## 2019-11-22 DIAGNOSIS — I10 Essential (primary) hypertension: Secondary | ICD-10-CM | POA: Diagnosis not present

## 2019-11-22 DIAGNOSIS — R7309 Other abnormal glucose: Secondary | ICD-10-CM | POA: Diagnosis not present

## 2019-11-22 DIAGNOSIS — Z79899 Other long term (current) drug therapy: Secondary | ICD-10-CM

## 2019-11-22 DIAGNOSIS — Z1212 Encounter for screening for malignant neoplasm of rectum: Secondary | ICD-10-CM

## 2019-11-22 MED ORDER — PHENTERMINE HCL 37.5 MG PO TABS: ORAL_TABLET | ORAL | 1 refills | Status: DC

## 2019-11-22 MED ORDER — TOPIRAMATE 50 MG PO TABS: ORAL_TABLET | ORAL | 1 refills | Status: DC

## 2019-11-23 ENCOUNTER — Other Ambulatory Visit: Payer: Self-pay | Admitting: Internal Medicine

## 2019-11-23 ENCOUNTER — Other Ambulatory Visit: Payer: Self-pay | Admitting: Adult Health

## 2019-11-23 ENCOUNTER — Other Ambulatory Visit: Payer: Self-pay | Admitting: *Deleted

## 2019-11-23 LAB — IRON, TOTAL/TOTAL IRON BINDING CAP
%SAT: 43 % (calc) (ref 16–45)
Iron: 141 ug/dL (ref 45–160)
TIBC: 326 mcg/dL (calc) (ref 250–450)

## 2019-11-23 LAB — CBC WITH DIFFERENTIAL/PLATELET
Absolute Monocytes: 439 cells/uL (ref 200–950)
Basophils Absolute: 40 cells/uL (ref 0–200)
Basophils Relative: 0.7 %
Eosinophils Absolute: 143 cells/uL (ref 15–500)
Eosinophils Relative: 2.5 %
HCT: 41.7 % (ref 35.0–45.0)
Hemoglobin: 13.5 g/dL (ref 11.7–15.5)
Lymphs Abs: 1682 cells/uL (ref 850–3900)
MCH: 31.4 pg (ref 27.0–33.0)
MCHC: 32.4 g/dL (ref 32.0–36.0)
MCV: 97 fL (ref 80.0–100.0)
MPV: 12.2 fL (ref 7.5–12.5)
Monocytes Relative: 7.7 %
Neutro Abs: 3397 cells/uL (ref 1500–7800)
Neutrophils Relative %: 59.6 %
Platelets: 259 10*3/uL (ref 140–400)
RBC: 4.3 10*6/uL (ref 3.80–5.10)
RDW: 12.6 % (ref 11.0–15.0)
Total Lymphocyte: 29.5 %
WBC: 5.7 10*3/uL (ref 3.8–10.8)

## 2019-11-23 LAB — URINALYSIS, ROUTINE W REFLEX MICROSCOPIC
Bilirubin Urine: NEGATIVE
Glucose, UA: NEGATIVE
Hgb urine dipstick: NEGATIVE
Ketones, ur: NEGATIVE
Leukocytes,Ua: NEGATIVE
Nitrite: NEGATIVE
Protein, ur: NEGATIVE
Specific Gravity, Urine: 1.024 (ref 1.001–1.03)
pH: 7.5 (ref 5.0–8.0)

## 2019-11-23 LAB — COMPLETE METABOLIC PANEL WITH GFR
AG Ratio: 1.6 (calc) (ref 1.0–2.5)
ALT: 25 U/L (ref 6–29)
AST: 20 U/L (ref 10–35)
Albumin: 4.1 g/dL (ref 3.6–5.1)
Alkaline phosphatase (APISO): 76 U/L (ref 37–153)
BUN/Creatinine Ratio: 13 (calc) (ref 6–22)
BUN: 16 mg/dL (ref 7–25)
CO2: 34 mmol/L — ABNORMAL HIGH (ref 20–32)
Calcium: 9.4 mg/dL (ref 8.6–10.4)
Chloride: 102 mmol/L (ref 98–110)
Creat: 1.25 mg/dL — ABNORMAL HIGH (ref 0.50–0.99)
GFR, Est African American: 53 mL/min/{1.73_m2} — ABNORMAL LOW (ref >=60)
GFR, Est Non African American: 46 mL/min/{1.73_m2} — ABNORMAL LOW (ref >=60)
Globulin: 2.6 g/dL (calc) (ref 1.9–3.7)
Glucose, Bld: 84 mg/dL (ref 65–99)
Potassium: 4.3 mmol/L (ref 3.5–5.3)
Sodium: 141 mmol/L (ref 135–146)
Total Bilirubin: 0.5 mg/dL (ref 0.2–1.2)
Total Protein: 6.7 g/dL (ref 6.1–8.1)

## 2019-11-23 LAB — LIPID PANEL
Cholesterol: 156 mg/dL (ref <200)
HDL: 71 mg/dL (ref >=50)
LDL Cholesterol (Calc): 69 mg/dL (calc)
Non-HDL Cholesterol (Calc): 85 mg/dL (calc) (ref <130)
Total CHOL/HDL Ratio: 2.2 (calc) (ref <5.0)
Triglycerides: 78 mg/dL (ref <150)

## 2019-11-23 LAB — MICROALBUMIN / CREATININE URINE RATIO
Creatinine, Urine: 152 mg/dL (ref 20–275)
Microalb Creat Ratio: 1 mcg/mg creat (ref <30)
Microalb, Ur: 0.2 mg/dL

## 2019-11-23 LAB — VITAMIN D 25 HYDROXY (VIT D DEFICIENCY, FRACTURES): Vit D, 25-Hydroxy: 47 ng/mL (ref 30–100)

## 2019-11-23 LAB — MAGNESIUM: Magnesium: 2.2 mg/dL (ref 1.5–2.5)

## 2019-11-23 LAB — HEMOGLOBIN A1C
Hgb A1c MFr Bld: 5.4 % of total Hgb (ref <5.7)
Mean Plasma Glucose: 108 (calc)
eAG (mmol/L): 6 (calc)

## 2019-11-23 LAB — TSH: TSH: 0.96 mIU/L (ref 0.40–4.50)

## 2019-11-23 LAB — INSULIN, RANDOM: Insulin: 21.6 u[IU]/mL — ABNORMAL HIGH

## 2019-11-23 LAB — VITAMIN B12: Vitamin B-12: >2000 pg/mL — ABNORMAL HIGH (ref 200–1100)

## 2019-11-23 MED ORDER — FUROSEMIDE 40 MG PO TABS: ORAL_TABLET | ORAL | 3 refills | Status: DC

## 2019-12-15 DIAGNOSIS — H52223 Regular astigmatism, bilateral: Secondary | ICD-10-CM | POA: Diagnosis not present

## 2019-12-15 DIAGNOSIS — H524 Presbyopia: Secondary | ICD-10-CM | POA: Diagnosis not present

## 2019-12-15 DIAGNOSIS — H5203 Hypermetropia, bilateral: Secondary | ICD-10-CM | POA: Diagnosis not present

## 2019-12-31 MED FILL — buPROPion HCL ER (XL) 300 M: 300 | 30 days supply | Qty: 30 | Fill #5

## 2020-01-07 MED FILL — EZETIMIBE 10 MG TABS: 10 | 90 days supply | Qty: 90 | Fill #2

## 2020-02-11 MED FILL — buPROPion HCL ER (XL) 300 M: 300 | 30 days supply | Qty: 30 | Fill #6

## 2020-02-24 ENCOUNTER — Ambulatory Visit: Payer: 59 | Admitting: Adult Health

## 2020-02-24 NOTE — Progress Notes (Signed)
FOLLOW UP  Assessment and Plan:   Hypertension Above goal today; increase olmesartan to 40 mg if remains above goal  Monitor blood pressure at home; patient to call if consistently greater than 130/80 Continue DASH diet - emphasized low sodium and increasing fluid intake  Reminder to go to the ER if any CP, SOB, nausea, dizziness, severe HA, changes vision/speech, left arm numbness and tingling and jaw pain.  Cholesterol Currently on zetia 10 mg, crestor - taking 40 mg twice a week per patient preference - she would be agreeable to increasing to three days a week if indicated by labs  Continue low cholesterol diet and exercise; discussed increasing soluble fiber or adding supplement Check lipid panel.   Prediabetes Well controlled by lifestyle changes  Continue diet and exercise.  Defer A1C this visit to CPE; check CMP  Hypothyroidism/ hx of thyroid cancer s/p resection on suppression therapy  continue medications the same reminded to take on an empty stomach 30-24mins before food.  check TSH level  Vitamin D Def/ osteoporosis prevention Continue supplementation Has been stable at goal at last few visits; defer vit D level  Tobacco use Discussed risks associated with tobacco use and advised to reduce or quit Patient is not ready to do so, but advised to consider strongly Will follow up at the next visit   Continue diet and meds as discussed. Further disposition pending results of labs. Discussed med's effects and SE's.   Over 30 minutes of exam, counseling, chart review, and critical decision making was performed.   Future Appointments  Date Time Provider [place]  06/05/2020  9:30 AM Unk Pinto, MD [facility] None  11/22/2020  3:00 PM Unk Pinto, MD [facility] None    ----------------------------------------------------------------------------------------------------------------------  HPI 64 y.o. female  presents for 3 month follow up on  hypertension, cholesterol, history of prediabetes, post surgical hypothyroid (following thyroid cancer) and vitamin D deficiency.  Patient had been diagnosed in the past with Pseudotumor cerebri consequent of a HA evaluated LP with slightly elevated CSF OP and was treated with Diamox. Last Eval at [facility] discounted that Dx with a Dx/o anomalous Optic Discs and not indeed papilloedema. Has been released by neurology and only follow up if needed.   she currently continues to smoke 0-2 cigarettes a day; discussed risks associated with smoking, patient is not ready to quit.   BMI is Body mass index is 30.24 kg/m., she has been working on diet and exercise, She reports she typically gets in 8000-10000 steps daily.  She was started on phentermine and topiramate; denies SE with phentermine as long as she doesn't drink too much caffeine in the AM Wt Readings from Last 3 Encounters:  02/28/20 168 lb (76.2 kg)  11/22/19 175 lb (79.4 kg)  07/22/19 174 lb (78.9 kg)   Her blood pressure has been controlled at home, taking atenolol 50 mg twice daily, lasix 20 mg once daily, olmesartan 20 mg, today their BP is BP: (!) 144/80  She reports BP at home range from 120-140/70s.  She does not workout. She denies chest pain, shortness of breath, dizziness, fatigue.    She is on cholesterol medication (zetia 10 mg daily, rosuvastatin 40 mg on Tuesdays and Thursday - not taking daily per strong patient preference and fear of complication r/t statin) and denies myalgias. Her cholesterol is not at goal. The cholesterol last visit was:   Lab Results  Component Value Date   CHOL 156 11/22/2019   HDL 71 11/22/2019  LDLCALC 69 11/22/2019   TRIG 78 11/22/2019   CHOLHDL 2.2 11/22/2019    She has been working on diet and exercise for history of prediabetes, and denies increased appetite, nausea, paresthesia of the feet, polydipsia, polyuria, visual disturbances and vomiting. Last A1C in the office was:  Lab  Results  Component Value Date   HGBA1C 5.4 11/22/2019   In 2012, patient had a subtotal thyroidectomy for a MNG and was found to have a small foci of Thyroid cancer. She has been on supressive therapy since. 112 mcg full tab MWF, 1/2 tab all other days- has been stable for a while on this dose   Her medication was not changed last visit.   Lab Results  Component Value Date   TSH 0.96 11/22/2019  . Patient is on Vitamin D supplement and at goal:    Lab Results  Component Value Date   VD25OH 47 11/22/2019      Current Medications:  Current Outpatient Medications on File Prior to Visit  Medication Sig  . aspirin 81 MG tablet Take 81 mg by mouth daily.    Marland Kitchen atenolol (TENORMIN) 100 MG tablet TAKE 1/2 TO 1 TABLET BY MOUTH DAILY FOR BLOOD PRESSURE  . buPROPion (WELLBUTRIN XL) 300 MG 24 hr tablet Take 1 tablet Daily for Mood, Focus & Concentration  . Cholecalciferol (VITAMIN D) 2000 units CAPS take 3 caps (6,000 units) / daily (Patient taking differently: 5,000 Units daily. )  . CINNAMON PO Take 1,000 mg by mouth 2 (two) times daily.   Kendall Flack 575 MG/5ML SYRP Take by mouth daily.  Marland Kitchen ezetimibe (ZETIA) 10 MG tablet Take 1 tablet Daily for  Cholesterol  . fexofenadine (ALLEGRA) 180 MG tablet Take 180 mg by mouth daily. Patient OTC Allegra PRN  . fluticasone (FLONASE) 50 MCG/ACT nasal spray PLACE 2 SPRAYS INTO BOTH NOSTRILS DAILY.  . furosemide (LASIX) 40 MG tablet TAKE 1 TABLET DAILY FOR BLOOD PRESSURE & FLUID RETENTION (Patient taking differently: TAKE 1/2 TABLET DAILY FOR BLOOD PRESSURE & FLUID RETENTION)  . levothyroxine (SYNTHROID) 112 MCG tablet TAKE 1 TABLET BY MOUTH ONCE DAILY FOR THYROID  . Magnesium 500 MG CAPS Take by mouth daily.  Marland Kitchen MINIVELLE 0.025 MG/24HR   . Multiple Vitamins-Minerals (HAIR/SKIN/NAILS PO) Take by mouth daily. 3 tablets daily  . olmesartan (BENICAR) 40 MG tablet Take 1 tablet Daily for BP (Patient taking differently: Take 1/2 tablet Daily for BP)  . Omega-3  Fatty Acids (FISH OIL) 1000 MG CAPS Take by mouth daily.  . phentermine (ADIPEX-P) 37.5 MG tablet Take 1/2 to 1 tablet every Morning for Dieting & Weight Loss  . rosuvastatin (CRESTOR) 40 MG tablet TAKE 1/2 TO 1 TABLET BY MOUTH DAILY OR AS DIRECTED FOR CHOLESTEROL  . topiramate (TOPAMAX) 50 MG tablet Take 1/2 to 1 tablet 2 x /day at Suppertime & Bedtime for Dieting & Weight Loss (Patient taking differently: Takes 1-2 days a week)  . vitamin E 400 UNIT capsule Take 400 Units by mouth daily.  . Zinc 25 MG TABS Take by mouth daily.  . Biotin 5000 MCG TABS Take by mouth daily. (Patient not taking: Reported on 02/28/2020)  . Cyanocobalamin (VITAMIN B-12) 1000 MCG SUBL Place 1 tablet under the tongue daily. (Patient not taking: Reported on 02/28/2020)   No current facility-administered medications on file prior to visit.     Allergies:  Allergies  Allergen Reactions  . Erythromycin Nausea And Vomiting and Other (See Comments)    Diarrhea - more  severe     Medical History:  Past Medical History:  Diagnosis Date  . Anxiety disorder   . Cancer ([place])    thyroid  . GERD (gastroesophageal reflux disease)   . History of thyroid cancer   . Hypertension   . Migraine headache   . Pseudopapilledema of both optic discs    bilateral  . Vitamin D deficiency    Family history- Reviewed and unchanged Social history- Reviewed and unchanged   Review of Systems:  Review of Systems  Constitutional: Negative for malaise/fatigue and weight loss.  HENT: Negative for hearing loss and tinnitus.   Eyes: Negative for blurred vision and double vision.  Respiratory: Negative for cough, shortness of breath and wheezing.   Cardiovascular: Negative for chest pain, palpitations, orthopnea, claudication and leg swelling.  Gastrointestinal: Negative for abdominal pain, blood in stool, constipation, diarrhea, heartburn, melena, nausea and vomiting.  Genitourinary: Negative.   Musculoskeletal: Negative for joint  pain and myalgias.  Skin: Negative for rash.  Neurological: Negative for dizziness, tingling, sensory change, weakness and headaches.  Endo/Heme/Allergies: Negative for polydipsia.  Psychiatric/Behavioral: Negative.   All other systems reviewed and are negative.     Physical Exam: BP (!) 144/80   Pulse 64   Temp (!) 97.2 F (36.2 C)   Wt 168 lb (76.2 kg)   SpO2 99%   BMI 30.24 kg/m  Wt Readings from Last 3 Encounters:  02/28/20 168 lb (76.2 kg)  11/22/19 175 lb (79.4 kg)  07/22/19 174 lb (78.9 kg)   General Appearance: Well nourished, in no apparent distress. Eyes: PERRLA, EOMs, conjunctiva no swelling or erythema Sinuses: No Frontal/maxillary tenderness ENT/Mouth: Ext aud canals clear, TMs without erythema, bulging. No erythema, swelling, or exudate on post pharynx.  Tonsils not swollen or erythematous. Hearing normal.  Neck: Supple, thyroid normal. Respiratory: Respiratory effort normal, BS equal bilaterally without rales, rhonchi, wheezing or stridor.  Cardio: RRR with no MRGs. Brisk peripheral pulses without edema.  Abdomen: Soft, + BS.  Non tender, no guarding, rebound, hernias, masses. Lymphatics: Non tender without lymphadenopathy.  Musculoskeletal: Full ROM, 5/5 strength, Normal gait Skin: Warm, dry without rashes, lesions, ecchymosis.  Neuro: Cranial nerves intact. No cerebellar symptoms.  Psych: Awake and oriented X 3, normal affect, Insight and Judgment appropriate.    Izora Ribas, NP 10:50 AM [facility]

## 2020-02-28 ENCOUNTER — Other Ambulatory Visit: Payer: Self-pay

## 2020-02-28 ENCOUNTER — Ambulatory Visit: Payer: 59 | Admitting: Adult Health

## 2020-02-28 ENCOUNTER — Encounter: Payer: Self-pay | Admitting: Adult Health

## 2020-02-28 VITALS — BP 146/78 | HR 64 | Temp 97.2°F | Wt 168.0 lb

## 2020-02-28 DIAGNOSIS — E559 Vitamin D deficiency, unspecified: Secondary | ICD-10-CM | POA: Diagnosis not present

## 2020-02-28 DIAGNOSIS — Z79899 Other long term (current) drug therapy: Secondary | ICD-10-CM

## 2020-02-28 DIAGNOSIS — R7303 Prediabetes: Secondary | ICD-10-CM | POA: Diagnosis not present

## 2020-02-28 DIAGNOSIS — F17200 Nicotine dependence, unspecified, uncomplicated: Secondary | ICD-10-CM | POA: Diagnosis not present

## 2020-02-28 DIAGNOSIS — E663 Overweight: Secondary | ICD-10-CM

## 2020-02-28 DIAGNOSIS — E890 Postprocedural hypothyroidism: Secondary | ICD-10-CM | POA: Diagnosis not present

## 2020-02-28 DIAGNOSIS — I10 Essential (primary) hypertension: Secondary | ICD-10-CM

## 2020-02-28 DIAGNOSIS — E782 Mixed hyperlipidemia: Secondary | ICD-10-CM | POA: Diagnosis not present

## 2020-02-28 DIAGNOSIS — C73 Malignant neoplasm of thyroid gland: Secondary | ICD-10-CM

## 2020-02-28 LAB — CBC WITH DIFFERENTIAL/PLATELET
Absolute Monocytes: 461 cells/uL (ref 200–950)
Basophils Absolute: 22 cells/uL (ref 0–200)
Basophils Relative: 0.3 %
Eosinophils Absolute: 58 cells/uL (ref 15–500)
Eosinophils Relative: 0.8 %
HCT: 42.4 % (ref 35.0–45.0)
Hemoglobin: 14 g/dL (ref 11.7–15.5)
Lymphs Abs: 1318 cells/uL (ref 850–3900)
MCH: 32.3 pg (ref 27.0–33.0)
MCHC: 33 g/dL (ref 32.0–36.0)
MCV: 97.7 fL (ref 80.0–100.0)
MPV: 12.1 fL (ref 7.5–12.5)
Monocytes Relative: 6.4 %
Neutro Abs: 5342 cells/uL (ref 1500–7800)
Neutrophils Relative %: 74.2 %
Platelets: 276 10*3/uL (ref 140–400)
RBC: 4.34 10*6/uL (ref 3.80–5.10)
RDW: 12.4 % (ref 11.0–15.0)
Total Lymphocyte: 18.3 %
WBC: 7.2 10*3/uL (ref 3.8–10.8)

## 2020-02-28 LAB — COMPLETE METABOLIC PANEL WITH GFR
AG Ratio: 1.6 (calc) (ref 1.0–2.5)
ALT: 30 U/L — ABNORMAL HIGH (ref 6–29)
AST: 19 U/L (ref 10–35)
Albumin: 4.2 g/dL (ref 3.6–5.1)
Alkaline phosphatase (APISO): 86 U/L (ref 37–153)
BUN: 8 mg/dL (ref 7–25)
CO2: 31 mmol/L (ref 20–32)
Calcium: 8.9 mg/dL (ref 8.6–10.4)
Chloride: 103 mmol/L (ref 98–110)
Creat: 0.93 mg/dL (ref 0.50–0.99)
GFR, Est African American: 76 mL/min/{1.73_m2} (ref >=60)
GFR, Est Non African American: 65 mL/min/{1.73_m2} (ref >=60)
Globulin: 2.7 g/dL (calc) (ref 1.9–3.7)
Glucose, Bld: 96 mg/dL (ref 65–99)
Potassium: 4.7 mmol/L (ref 3.5–5.3)
Sodium: 140 mmol/L (ref 135–146)
Total Bilirubin: 0.4 mg/dL (ref 0.2–1.2)
Total Protein: 6.9 g/dL (ref 6.1–8.1)

## 2020-02-28 LAB — LIPID PANEL
Cholesterol: 151 mg/dL (ref <200)
HDL: 73 mg/dL (ref >=50)
LDL Cholesterol (Calc): 62 mg/dL (calc)
Non-HDL Cholesterol (Calc): 78 mg/dL (calc) (ref <130)
Total CHOL/HDL Ratio: 2.1 (calc) (ref <5.0)
Triglycerides: 79 mg/dL (ref <150)

## 2020-02-28 LAB — MAGNESIUM: Magnesium: 2.3 mg/dL (ref 1.5–2.5)

## 2020-02-28 LAB — TSH: TSH: 1.4 mIU/L (ref 0.40–4.50)

## 2020-02-28 NOTE — Patient Instructions (Addendum)
Goals    . Blood Pressure < 130/80    . Exercise 150 min/wk Moderate Activity    . Weight (lb) < 160 lb (72.6 kg)       If mostly in 130-140s, go ahead and increase olmesartan to whole tab    HYPERTENSION INFORMATION  Monitor your blood pressure at home, please keep a record and bring that in with you to your next office visit.   Go to the ER if any CP, SOB, nausea, dizziness, severe HA, changes vision/speech  Testing/Procedures: HOW TO TAKE YOUR BLOOD PRESSURE:  Rest 5 minutes before taking your blood pressure.  Don't smoke or drink caffeinated beverages for at least 30 minutes before.  Take your blood pressure before (not after) you eat.  Sit comfortably with your back supported and both feet on the floor (don't cross your legs).  Elevate your arm to heart level on a table or a desk.  Use the proper sized cuff. It should fit smoothly and snugly around your bare upper arm. There should be enough room to slip a fingertip under the cuff. The bottom edge of the cuff should be 1 inch above the crease of the elbow.  Due to a recent study, SPRINT, we have changed our goal for the systolic or top blood pressure number. Ideally we want your top number at 120.  In the [facility] Trial, 5000 people were randomized to a goal BP of 120 and 5000 people were randomized to a goal BP of less than 140. The patients with the goal BP at 120 had LESS DEMENTIA, LESS HEART ATTACKS, AND LESS STROKES, AS WELL AS OVERALL DECREASED MORTALITY OR DEATH RATE.   There was another study that showed taking your blood pressure medications at night decrease cardiovascular events.  However if you are on a fluid pill, please take this in the morning.   If you are willing, our goal BP is the top number of 120.   Your most recent BP: BP: (!) 146/78   Take your medications faithfully as instructed. Maintain a healthy weight. Get at least 150 minutes of aerobic exercise per week. Minimize salt intake. Minimize  alcohol intake  DASH Eating Plan DASH stands for "Dietary Approaches to Stop Hypertension." The DASH eating plan is a healthy eating plan that has been shown to reduce high blood pressure (hypertension). Additional health benefits may include reducing the risk of type 2 diabetes mellitus, heart disease, and stroke. The DASH eating plan may also help with weight loss. WHAT DO I NEED TO KNOW ABOUT THE DASH EATING PLAN? For the DASH eating plan, you will follow these general guidelines:  Choose foods with a percent daily value for sodium of less than 5% (as listed on the food label).  Use salt-free seasonings or herbs instead of table salt or sea salt.  Check with your health care provider or pharmacist before using salt substitutes.  Eat lower-sodium products, often labeled as "lower sodium" or "no salt added."  Eat fresh foods.  Eat more vegetables, fruits, and low-fat dairy products.  Choose whole grains. Look for the word "whole" as the first word in the ingredient list.  Choose fish and skinless chicken or [place] more often than red meat. Limit fish, poultry, and meat to 6 oz (170 g) each day.  Limit sweets, desserts, sugars, and sugary drinks.  Choose heart-healthy fats.  Limit cheese to 1 oz (28 g) per day.  Eat more home-cooked food and less restaurant, buffet, and fast  food.  Limit fried foods.  Cook foods using methods other than frying.  Limit canned vegetables. If you do use them, rinse them well to decrease the sodium.  When eating at a restaurant, ask that your food be prepared with less salt, or no salt if possible. WHAT FOODS CAN I EAT? Seek help from a dietitian for individual calorie needs. Grains Whole grain or whole wheat bread. Brown rice. Whole grain or whole wheat pasta. Quinoa, bulgur, and whole grain cereals. Low-sodium cereals. Corn or whole wheat flour tortillas. Whole grain cornbread. Whole grain crackers. Low-sodium crackers. Vegetables Fresh or  frozen vegetables (raw, steamed, roasted, or grilled). Low-sodium or reduced-sodium tomato and vegetable juices. Low-sodium or reduced-sodium tomato sauce and paste. Low-sodium or reduced-sodium canned vegetables.  Fruits All fresh, canned (in natural juice), or frozen fruits. Meat and Other Protein Products Ground beef (85% or leaner), grass-fed beef, or beef trimmed of fat. Skinless chicken or [place]. Ground chicken or [place]. Pork trimmed of fat. All fish and seafood. Eggs. Dried beans, peas, or lentils. Unsalted nuts and seeds. Unsalted canned beans. Dairy Low-fat dairy products, such as skim or 1% milk, 2% or reduced-fat cheeses, low-fat ricotta or cottage cheese, or plain low-fat yogurt. Low-sodium or reduced-sodium cheeses. Fats and Oils Tub margarines without trans fats. Light or reduced-fat mayonnaise and salad dressings (reduced sodium). Avocado. Safflower, olive, or canola oils. Natural peanut or almond butter. Other Unsalted popcorn and pretzels. The items listed above may not be a complete list of recommended foods or beverages. Contact your dietitian for more options. WHAT FOODS ARE NOT RECOMMENDED? Grains White bread. White pasta. White rice. Refined cornbread. Bagels and croissants. Crackers that contain trans fat. Vegetables Creamed or fried vegetables. Vegetables in a cheese sauce. Regular canned vegetables. Regular canned tomato sauce and paste. Regular tomato and vegetable juices. Fruits Dried fruits. Canned fruit in light or heavy syrup. Fruit juice. Meat and Other Protein Products Fatty cuts of meat. Ribs, chicken wings, bacon, sausage, bologna, salami, chitterlings, fatback, hot dogs, bratwurst, and packaged luncheon meats. Salted nuts and seeds. Canned beans with salt. Dairy Whole or 2% milk, cream, half-and-half, and cream cheese. Whole-fat or sweetened yogurt. Full-fat cheeses or blue cheese. Nondairy creamers and whipped toppings. Processed cheese, cheese spreads, or  cheese curds. Condiments Onion and garlic salt, seasoned salt, table salt, and sea salt. Canned and packaged gravies. Worcestershire sauce. Tartar sauce. Barbecue sauce. Teriyaki sauce. Soy sauce, including reduced sodium. Steak sauce. Fish sauce. Oyster sauce. Cocktail sauce. Horseradish. Ketchup and mustard. Meat flavorings and tenderizers. Bouillon cubes. Hot sauce. Tabasco sauce. Marinades. Taco seasonings. Relishes. Fats and Oils Butter, stick margarine, lard, shortening, ghee, and bacon fat. Coconut, palm kernel, or palm oils. Regular salad dressings. Other Pickles and olives. Salted popcorn and pretzels. The items listed above may not be a complete list of foods and beverages to avoid. Contact your dietitian for more information. WHERE CAN I FIND MORE INFORMATION? National Heart, Lung, and Blood Institute: travelstabloid.com Document Released: 06/20/2011 Document Revised: 11/15/2013 Document Reviewed: 05/05/2013 [facility] Patient Information 2015 [place], [place]. This information is not intended to replace advice given to you by your health care provider. Make sure you discuss any questions you have with your health care provider.

## 2020-03-17 MED FILL — buPROPion HCL ER (XL) 300 M: 300 | 30 days supply | Qty: 30 | Fill #7

## 2020-03-22 ENCOUNTER — Other Ambulatory Visit: Payer: Self-pay | Admitting: Internal Medicine

## 2020-03-22 DIAGNOSIS — E890 Postprocedural hypothyroidism: Secondary | ICD-10-CM

## 2020-03-22 DIAGNOSIS — I10 Essential (primary) hypertension: Secondary | ICD-10-CM

## 2020-03-22 MED ORDER — ATENOLOL 100 MG PO TABS: ORAL_TABLET | ORAL | 0 refills | Status: DC

## 2020-03-22 MED ORDER — LEVOTHYROXINE SODIUM 112 MCG PO TABS: ORAL_TABLET | ORAL | 0 refills | Status: DC

## 2020-03-22 MED FILL — LEVOTHYROXINE 112 MCG TAB: 112 | 90 days supply | Qty: 90 | Fill #0

## 2020-03-22 MED FILL — ATENOLOL 100 MG TABLET: 100 | 90 days supply | Qty: 90 | Fill #0

## 2020-03-23 ENCOUNTER — Other Ambulatory Visit: Payer: Self-pay | Admitting: Internal Medicine

## 2020-03-23 ENCOUNTER — Other Ambulatory Visit: Payer: Self-pay | Admitting: *Deleted

## 2020-03-23 DIAGNOSIS — I10 Essential (primary) hypertension: Secondary | ICD-10-CM

## 2020-03-23 MED ORDER — OLMESARTAN MEDOXOMIL 40 MG PO TABS: ORAL_TABLET | ORAL | 3 refills | Status: DC

## 2020-03-23 MED ORDER — ATENOLOL 100 MG PO TABS: ORAL_TABLET | ORAL | 0 refills | Status: DC

## 2020-03-23 MED FILL — OLMESARTAN MEDOXOMIL 40 MG: 40 | 90 days supply | Qty: 90 | Fill #0

## 2020-03-24 ENCOUNTER — Other Ambulatory Visit: Payer: Self-pay | Admitting: *Deleted

## 2020-03-24 DIAGNOSIS — E890 Postprocedural hypothyroidism: Secondary | ICD-10-CM

## 2020-03-24 MED ORDER — LEVOTHYROXINE SODIUM 112 MCG PO TABS: ORAL_TABLET | ORAL | 3 refills | Status: DC

## 2020-03-25 ENCOUNTER — Other Ambulatory Visit: Payer: Self-pay | Admitting: Internal Medicine

## 2020-03-25 DIAGNOSIS — E890 Postprocedural hypothyroidism: Secondary | ICD-10-CM

## 2020-03-25 DIAGNOSIS — I10 Essential (primary) hypertension: Secondary | ICD-10-CM

## 2020-03-25 MED ORDER — ATENOLOL 100 MG PO TABS: ORAL_TABLET | ORAL | 0 refills | Status: DC

## 2020-03-25 MED ORDER — LEVOTHYROXINE SODIUM 112 MCG PO TABS: ORAL_TABLET | ORAL | 3 refills | Status: DC

## 2020-03-26 ENCOUNTER — Other Ambulatory Visit: Payer: Self-pay | Admitting: Internal Medicine

## 2020-03-26 DIAGNOSIS — I10 Essential (primary) hypertension: Secondary | ICD-10-CM

## 2020-03-26 DIAGNOSIS — E890 Postprocedural hypothyroidism: Secondary | ICD-10-CM

## 2020-03-26 MED ORDER — ATENOLOL 100 MG PO TABS: ORAL_TABLET | ORAL | 0 refills | Status: DC

## 2020-03-26 MED ORDER — LEVOTHYROXINE SODIUM 112 MCG PO TABS: ORAL_TABLET | ORAL | 0 refills | Status: DC

## 2020-04-23 MED FILL — FUROSEMIDE 40 MG TAB: 40 | 90 days supply | Qty: 90 | Fill #1

## 2020-04-24 MED FILL — PHENTERMINE 37.5 MG TABLET: 37.5 | 90 days supply | Qty: 90 | Fill #1

## 2020-04-26 MED FILL — buPROPion HCL ER (XL) 300 M: 300 | 30 days supply | Qty: 30 | Fill #8

## 2020-05-05 ENCOUNTER — Other Ambulatory Visit: Payer: Self-pay | Admitting: Internal Medicine

## 2020-05-05 MED FILL — ESTRADIOL 0.025 MG/24HR PTT: 0.025 | 84 days supply | Qty: 24 | Fill #1

## 2020-05-05 MED FILL — buPROPion HCL ER (XL) 300 M: 300 | 30 days supply | Qty: 30 | Fill #8

## 2020-05-06 MED FILL — EZETIMIBE 10 MG TABS: 10 | 90 days supply | Qty: 90 | Fill #0

## 2020-06-04 ENCOUNTER — Encounter: Payer: Self-pay | Admitting: Internal Medicine

## 2020-06-04 NOTE — Patient Instructions (Signed)

## 2020-06-04 NOTE — Progress Notes (Signed)
History of Present Illness:       This very nice 64 y.o. DBF presents for 6 month follow up with HTN, HLD, Pre-Diabetes and Vitamin D Deficiency.       Patient is treated for HTN  (1992) & BP has been controlled at home. Today's BP is high normal -  139/84. Patient has had no complaints of any cardiac type chest pain, palpitations, dyspnea / orthopnea / PND, dizziness, claudication, or dependent edema.      Hyperlipidemia is controlled with diet & Rosuvastatin /Ezetimibe. Patient denies myalgias or other med SE's. Last Lipids were at goal:  Lab Results  Component Value Date   CHOL 151 02/28/2020   HDL 73 02/28/2020   LDLCALC 62 02/28/2020   TRIG 79 02/28/2020   CHOLHDL 2.1 02/28/2020    Also, the patient has history of PreDiabetes (A1c 5.8% /2011)and has had no symptoms of reactive hypoglycemia, diabetic polys, paresthesias or visual blurring.  Last A1c was Normal & at goal:   Lab Results  Component Value Date   HGBA1C 5.4 11/22/2019       In 2012, patient had a subtotal thyroidectomy  for a MNG and was found to have a small foci of Thyroid cancer & has been on supressive therapy since.        Further, the patient also has history of Vitamin D Deficiency ("33" /2008) and supplements vitamin D without any suspected side-effects. Last vitamin D was slightly low (goal 70-100):  Lab Results  Component Value Date   VD25OH 47 11/22/2019    Current Outpatient Medications on File Prior to Visit  Medication Sig  . aspirin 81 MG tablet Take 8 daily.    Marland Kitchen atenolol  100 MG  Take 1 tablet     Daily       . buPROPion-XL 300 MG  Take 1 tablet Daily  . VITAMIN D 2000 units   5,000 Units daily. )  . CINNAMON  1,000 mg Take 2 ,000 mg 2 x daily.   Kendall Flack 575 MG/5ML SYRP Take daily.  Marland Kitchen ezetimibe  10 MG tablet Take     1 tablet     Daily f  . fexofenadine  180 MG  Take 1daily.   Marland Kitchen FLONASE nasal spray PLACE 2 SPRAYS INTO NOSTRILS DAILY.  .  furosemide 40 MG  TAKE 1 TABLET DAILY   . levothyroxine 112 MCG  Take 1 tablet    Daily   . Magnesium 500 MG  Take  daily.  Marland Kitchen MINIVELLE 0.025    . HAIR/SKIN/NAILS Take 3 tablets daily  . olmesartan  40 MG  TAKE 1 TABLET DAILY   . Omega-3 FISH OIL 1000 MG  Take  daily.  . phentermine  37.5 MG t Take 1/2 to 1 tablet every Morning   . rosuvastatin  40 MG tablet TAKE 1/2 TO 1 TABLET DAILY   . topiramate  50 MG tablet Take 1/2 to 1 tablet 2 x /day   . vitamin E 400 UNIT capsule Take 400 Units  daily.  . Zinc 25 MG TABS Take daily.     Allergies  Allergen Reactions  . Erythromycin Nausea And Vomiting and Diarrhea (severe)        PMHx:   Past Medical History:  Diagnosis Date  . Anxiety disorder   . Cancer ([place])    thyroid  . GERD (gastroesophageal reflux disease)   . History of thyroid cancer   . Hypertension   .  Migraine headache   . Pseudopapilledema of both optic discs    bilateral  . Vitamin D deficiency     Immunization History  Administered Date(s) Administered  . DTaP 12/14/1994  . Hepatitis B 07/15/1989  . Influenza-Unspecified 04/29/2017  . PFIZER SARS-COV-2 Vaccination 07/05/2019, 07/23/2019  . PPD Test 07/18/2014, 08/04/2015, 10/05/2018, 11/22/2019  . Pneumococcal Polysaccharide-23 10/09/1998  . Tdap 05/03/2015    Past Surgical History:  Procedure Laterality Date  . ABDOMINAL HYSTERECTOMY  2005  . THYROIDECTOMY  04/25/11    FHx:    Reviewed / unchanged  SHx:    Reviewed / unchanged   Systems Review:  Constitutional: Denies fever, chills, wt changes, headaches, insomnia, fatigue, night sweats, change in appetite. Eyes: Denies redness, blurred vision, diplopia, discharge, itchy, watery eyes.  ENT: Denies discharge, congestion, post nasal drip, epistaxis, sore throat, earache, hearing loss, dental pain, tinnitus, vertigo, sinus pain, snoring.  CV: Denies chest pain, palpitations, irregular heartbeat, syncope, dyspnea, diaphoresis, orthopnea, PND,  claudication or edema. Respiratory: denies cough, dyspnea, DOE, pleurisy, hoarseness, laryngitis, wheezing.  Gastrointestinal: Denies dysphagia, odynophagia, heartburn, reflux, water brash, abdominal pain or cramps, nausea, vomiting, bloating, diarrhea, constipation, hematemesis, melena, hematochezia  or hemorrhoids. Genitourinary: Denies dysuria, frequency, urgency, nocturia, hesitancy, discharge, hematuria or flank pain. Musculoskeletal: Denies arthralgias, myalgias, stiffness, jt. swelling, pain, limping or strain/sprain.  Skin: Denies pruritus, rash, hives, warts, acne, eczema or change in skin lesion(s). Neuro: No weakness, tremor, incoordination, spasms, paresthesia or pain. Psychiatric: Denies confusion, memory loss or sensory loss. Endo: Denies change in weight, skin or hair change.  Heme/Lymph: No excessive bleeding, bruising or enlarged lymph nodes.  Physical Exam  BP 139/84   Pulse 61   Temp (!) 96.7 F (35.9 C)   Resp 16   Ht 5' 2.5" (1.588 m)   Wt 170 lb 9.6 oz (77.4 kg)   SpO2 99%   BMI 30.71 kg/m   Appears  well nourished, well groomed  and in no distress.  Eyes: PERRLA, EOMs, conjunctiva no swelling or erythema. Sinuses: No frontal/maxillary tenderness ENT/Mouth: EAC's clear, TM's nl w/o erythema, bulging. Nares clear w/o erythema, swelling, exudates. Oropharynx clear without erythema or exudates. Oral hygiene is good. Tongue normal, non obstructing. Hearing intact.  Neck: Supple. Thyroid not palpable. Car 2+/2+ without bruits, nodes or JVD. Chest: Respirations nl with BS clear & equal w/o rales, rhonchi, wheezing or stridor.  Cor: Heart sounds normal w/ regular rate and rhythm without sig. murmurs, gallops, clicks or rubs. Peripheral pulses normal and equal  without edema.  Abdomen: Soft & bowel sounds normal. Non-tender w/o guarding, rebound, hernias, masses or organomegaly.  Lymphatics: Unremarkable.  Musculoskeletal: Full ROM all peripheral extremities, joint  stability, 5/5 strength and normal gait.  Skin: Warm, dry without exposed rashes, lesions or ecchymosis apparent.  Neuro: Cranial nerves intact, reflexes equal bilaterally. Sensory-motor testing grossly intact. Tendon reflexes grossly intact.  Pysch: Alert & oriented x 3.  Insight and judgement nl & appropriate. No ideations.  Assessment and Plan:  1. Essential hypertension  - Continue medication, monitor blood pressure at home.  - Continue DASH diet.  Reminder to go to the ER if any CP,  SOB, nausea, dizziness, severe HA, changes vision/speech.  - CBC with Differential/Platelet - COMPLETE METABOLIC PANEL WITH GFR - Magnesium - TSH  2. Hyperlipidemia, mixed  - Continue diet/meds, exercise,& lifestyle modifications.  - Continue monitor periodic cholesterol/liver & renal functions   - Lipid panel - TSH  3. Abnormal glucose  - Continue diet, exercise  -  Lifestyle modifications.  - Monitor appropriate labs.  - Hemoglobin A1c - Insulin, random  4. Vitamin D deficiency  - Continue supplementation.  - VITAMIN D 25 Hydroxy   5. Hypothyroidism  - TSH  6. Medication management  - CBC with Differential/Platelet - COMPLETE METABOLIC PANEL WITH GFR - Magnesium - Lipid panel - TSH - Hemoglobin A1c - Insulin, random - VITAMIN D 25 Hydroxy        Discussed  regular exercise, BP monitoring, weight control to achieve/maintain BMI less than 25 and discussed med and SE's. Recommended labs to assess and monitor clinical status with further disposition pending results of labs.  I discussed the assessment and treatment plan with the patient. The patient was provided an opportunity to ask questions and all were answered. The patient agreed with the plan and demonstrated an understanding of the instructions.  I provided over 30 minutes of exam, counseling, chart review and  complex critical decision making.   Kirtland Bouchard, MD

## 2020-06-05 ENCOUNTER — Other Ambulatory Visit: Payer: Self-pay

## 2020-06-05 ENCOUNTER — Ambulatory Visit (INDEPENDENT_AMBULATORY_CARE_PROVIDER_SITE_OTHER): Payer: 59 | Admitting: Internal Medicine

## 2020-06-05 ENCOUNTER — Encounter: Payer: Self-pay | Admitting: Internal Medicine

## 2020-06-05 VITALS — BP 139/84 | HR 61 | Temp 96.7°F | Resp 16 | Ht 62.5 in | Wt 170.6 lb

## 2020-06-05 DIAGNOSIS — Z79899 Other long term (current) drug therapy: Secondary | ICD-10-CM

## 2020-06-05 DIAGNOSIS — I10 Essential (primary) hypertension: Secondary | ICD-10-CM

## 2020-06-05 DIAGNOSIS — E782 Mixed hyperlipidemia: Secondary | ICD-10-CM | POA: Diagnosis not present

## 2020-06-05 DIAGNOSIS — R7309 Other abnormal glucose: Secondary | ICD-10-CM | POA: Diagnosis not present

## 2020-06-05 DIAGNOSIS — E039 Hypothyroidism, unspecified: Secondary | ICD-10-CM | POA: Diagnosis not present

## 2020-06-05 DIAGNOSIS — E559 Vitamin D deficiency, unspecified: Secondary | ICD-10-CM | POA: Diagnosis not present

## 2020-06-06 LAB — LIPID PANEL
Cholesterol: 204 mg/dL — ABNORMAL HIGH (ref <200)
HDL: 76 mg/dL (ref >=50)
LDL Cholesterol (Calc): 106 mg/dL (calc) — ABNORMAL HIGH
Non-HDL Cholesterol (Calc): 128 mg/dL (calc) (ref <130)
Total CHOL/HDL Ratio: 2.7 (calc) (ref <5.0)
Triglycerides: 120 mg/dL (ref <150)

## 2020-06-06 LAB — COMPLETE METABOLIC PANEL WITH GFR
AG Ratio: 1.6 (calc) (ref 1.0–2.5)
ALT: 18 U/L (ref 6–29)
AST: 18 U/L (ref 10–35)
Albumin: 4.2 g/dL (ref 3.6–5.1)
Alkaline phosphatase (APISO): 86 U/L (ref 37–153)
BUN/Creatinine Ratio: 11 (calc) (ref 6–22)
BUN: 11 mg/dL (ref 7–25)
CO2: 31 mmol/L (ref 20–32)
Calcium: 9.3 mg/dL (ref 8.6–10.4)
Chloride: 102 mmol/L (ref 98–110)
Creat: 1.01 mg/dL — ABNORMAL HIGH (ref 0.50–0.99)
GFR, Est African American: 69 mL/min/{1.73_m2} (ref >=60)
GFR, Est Non African American: 59 mL/min/{1.73_m2} — ABNORMAL LOW (ref >=60)
Globulin: 2.7 g/dL (calc) (ref 1.9–3.7)
Glucose, Bld: 89 mg/dL (ref 65–99)
Potassium: 4.2 mmol/L (ref 3.5–5.3)
Sodium: 141 mmol/L (ref 135–146)
Total Bilirubin: 0.4 mg/dL (ref 0.2–1.2)
Total Protein: 6.9 g/dL (ref 6.1–8.1)

## 2020-06-06 LAB — CBC WITH DIFFERENTIAL/PLATELET
Absolute Monocytes: 339 cells/uL (ref 200–950)
Basophils Absolute: 42 cells/uL (ref 0–200)
Basophils Relative: 0.8 %
Eosinophils Absolute: 90 cells/uL (ref 15–500)
Eosinophils Relative: 1.7 %
HCT: 40.7 % (ref 35.0–45.0)
Hemoglobin: 13.4 g/dL (ref 11.7–15.5)
Lymphs Abs: 1585 cells/uL (ref 850–3900)
MCH: 31.5 pg (ref 27.0–33.0)
MCHC: 32.9 g/dL (ref 32.0–36.0)
MCV: 95.5 fL (ref 80.0–100.0)
MPV: 12.1 fL (ref 7.5–12.5)
Monocytes Relative: 6.4 %
Neutro Abs: 3244 cells/uL (ref 1500–7800)
Neutrophils Relative %: 61.2 %
Platelets: 272 10*3/uL (ref 140–400)
RBC: 4.26 10*6/uL (ref 3.80–5.10)
RDW: 12.8 % (ref 11.0–15.0)
Total Lymphocyte: 29.9 %
WBC: 5.3 10*3/uL (ref 3.8–10.8)

## 2020-06-06 LAB — HEMOGLOBIN A1C
Hgb A1c MFr Bld: 5.7 % of total Hgb — ABNORMAL HIGH (ref <5.7)
Mean Plasma Glucose: 117 (calc)
eAG (mmol/L): 6.5 (calc)

## 2020-06-06 LAB — TSH: TSH: 2.53 mIU/L (ref 0.40–4.50)

## 2020-06-06 LAB — VITAMIN D 25 HYDROXY (VIT D DEFICIENCY, FRACTURES): Vit D, 25-Hydroxy: 61 ng/mL (ref 30–100)

## 2020-06-06 LAB — MAGNESIUM: Magnesium: 1.9 mg/dL (ref 1.5–2.5)

## 2020-06-06 LAB — INSULIN, RANDOM: Insulin: 12.2 u[IU]/mL

## 2020-06-06 NOTE — Progress Notes (Signed)
========================================================== -   Test results slightly outside the reference range are not unusual. If there is anything important, I will review this with you,  otherwise it is considered normal test values.  If you have further questions,  please do not hesitate to contact me at the office or via My Chart.  ==========================================================  -  Total Chol 204 is OK ,since have such a high level of  "Good" HDL Chol = 76 - Great  !   - The bad LDL Chol = 106 is higher  (Ideal or Goal LDL is less than 70 !  )  - So . . . Marland Kitchen  need a stricter low Chol diet    - Cholesterol only comes from animal sources  - ie. meat, dairy, egg yolks  - Eat all the vegetables you want.  - Avoid meat, especially red meat - Beef AND Pork .  - Avoid cheese & dairy - milk & ice cream.     - Cheese is the most concentrated form of trans-fats which  is the worst thing to clog up our arteries.   - Veggie cheese is OK which can be found in the fresh  produce section at [employer] or [employer] or [employer] ==========================================================  -  Thyroid is Normal - So please keep dose same ==========================================================  -  Vitamin D = 61 -Great  ==========================================================  -  All Else - CBC - Kidneys - Electrolytes - Liver - Magnesium & Thyroid    - all  Normal / OK ==========================================================

## 2020-06-12 ENCOUNTER — Other Ambulatory Visit: Payer: Self-pay | Admitting: Internal Medicine

## 2020-06-12 MED FILL — buPROPion HCL ER (XL) 300 M: 300 | 90 days supply | Qty: 90 | Fill #0

## 2020-07-28 ENCOUNTER — Other Ambulatory Visit: Payer: Self-pay | Admitting: Internal Medicine

## 2020-07-28 DIAGNOSIS — E6609 Other obesity due to excess calories: Secondary | ICD-10-CM

## 2020-07-28 MED FILL — OLMESARTAN MEDOXOMIL 40 MG: 40 | 90 days supply | Qty: 90 | Fill #1

## 2020-07-29 MED FILL — PHENTERMINE 37.5 MG TABLET: 37.5 | 90 days supply | Qty: 90 | Fill #0

## 2020-08-03 MED FILL — ATENOLOL 100 MG TABLET: 100 | 90 days supply | Qty: 90 | Fill #0

## 2020-08-29 ENCOUNTER — Other Ambulatory Visit (HOSPITAL_COMMUNITY): Payer: Self-pay | Admitting: Gynecology

## 2020-08-29 DIAGNOSIS — Z780 Asymptomatic menopausal state: Secondary | ICD-10-CM | POA: Diagnosis not present

## 2020-08-29 DIAGNOSIS — Z01419 Encounter for gynecological examination (general) (routine) without abnormal findings: Secondary | ICD-10-CM | POA: Diagnosis not present

## 2020-08-29 DIAGNOSIS — Z6829 Body mass index (BMI) 29.0-29.9, adult: Secondary | ICD-10-CM | POA: Diagnosis not present

## 2020-08-29 DIAGNOSIS — Z130 Encounter for screening for diseases of the blood and blood-forming organs and certain disorders involving the immune mechanism: Secondary | ICD-10-CM | POA: Diagnosis not present

## 2020-08-29 DIAGNOSIS — Z79890 Hormone replacement therapy (postmenopausal): Secondary | ICD-10-CM | POA: Diagnosis not present

## 2020-08-29 DIAGNOSIS — Z1231 Encounter for screening mammogram for malignant neoplasm of breast: Secondary | ICD-10-CM | POA: Diagnosis not present

## 2020-08-29 MED FILL — ESTRADIOL 0.025 MG/24HR PTT: 0.025 | 84 days supply | Qty: 24 | Fill #0

## 2020-09-07 ENCOUNTER — Other Ambulatory Visit: Payer: Self-pay | Admitting: Internal Medicine

## 2020-09-07 ENCOUNTER — Other Ambulatory Visit: Payer: Self-pay | Admitting: Adult Health Nurse Practitioner

## 2020-09-07 MED FILL — EZETIMIBE 10 MG TABS: 10 | 90 days supply | Qty: 90 | Fill #0

## 2020-09-11 ENCOUNTER — Other Ambulatory Visit: Payer: Self-pay | Admitting: Adult Health

## 2020-09-11 ENCOUNTER — Other Ambulatory Visit: Payer: Self-pay | Admitting: Internal Medicine

## 2020-09-11 MED FILL — buPROPion HCL ER (XL) 300 M: 300 | 90 days supply | Qty: 90 | Fill #0

## 2020-09-25 ENCOUNTER — Ambulatory Visit: Payer: 59 | Admitting: Adult Health Nurse Practitioner

## 2020-10-10 ENCOUNTER — Ambulatory Visit: Payer: Federal, State, Local not specified - PPO | Admitting: Adult Health Nurse Practitioner

## 2020-10-10 ENCOUNTER — Other Ambulatory Visit: Payer: Self-pay | Admitting: Adult Health Nurse Practitioner

## 2020-10-10 ENCOUNTER — Other Ambulatory Visit: Payer: Self-pay

## 2020-10-10 ENCOUNTER — Encounter: Payer: Self-pay | Admitting: Adult Health Nurse Practitioner

## 2020-10-10 VITALS — BP 126/80 | HR 52 | Temp 97.7°F | Wt 174.4 lb

## 2020-10-10 DIAGNOSIS — E039 Hypothyroidism, unspecified: Secondary | ICD-10-CM | POA: Diagnosis not present

## 2020-10-10 DIAGNOSIS — E782 Mixed hyperlipidemia: Secondary | ICD-10-CM

## 2020-10-10 DIAGNOSIS — I10 Essential (primary) hypertension: Secondary | ICD-10-CM | POA: Diagnosis not present

## 2020-10-10 DIAGNOSIS — E559 Vitamin D deficiency, unspecified: Secondary | ICD-10-CM | POA: Diagnosis not present

## 2020-10-10 DIAGNOSIS — R7309 Other abnormal glucose: Secondary | ICD-10-CM | POA: Diagnosis not present

## 2020-10-10 MED ORDER — BUPROPION HCL ER (XL) 150 MG PO TB24
150.0000 mg | ORAL_TABLET | ORAL | 2 refills | Status: DC
Start: 2020-10-10 — End: 2020-10-10

## 2020-10-10 NOTE — Progress Notes (Signed)
FOLLOW UP 3 MONTH  Assessment and Plan:   Hypertension Continue Atenolol 100mg , half tablet, furosemide 40mg , olmesartan 40mg , half tablet Monitor blood pressure at home; patient to call if consistently greater than 130/80 Continue DASH diet - emphasized low sodium and increasing fluid intake  Reminder to go to the ER if any CP, SOB, nausea, dizziness, severe HA, changes vision/speech, left arm numbness and tingling and jaw pain.  Cholesterol Currently on zetia 10 mg,  stopped rosuvastatin  40mg  Continue low cholesterol diet and exercise; discussed increasing soluble fiber or adding supplement Check lipid panel.   Prediabetes Well controlled by lifestyle changes  Continue diet and exercise.  Defer A1C this visit to CPE; check CMP  Hypothyroidism/ hx of thyroid cancer s/p resection on suppression therapy  continue medications the same Taking levothyroxine 112 mcg whole tablet three days a week and half tablet four days a week. reminded to take on an empty stomach 30-52mins before food.  check TSH level  Vitamin D Def/ osteoporosis prevention Continue supplementation Has been stable at goal at last few visits; defer vit D level  Tobacco use Discussed risks associated with tobacco use and advised to reduce or quit Patient is not ready to do so, but advised to consider strongly Will follow up at the next visit   Continue diet and meds as discussed. Further disposition pending results of labs. Discussed med's effects and SE's.   Over 30 minutes of face to face interview, exam, counseling, chart review, and critical decision making was performed.   Future Appointments  Date Time Provider [place]  01/18/2021 11:00 AM Unk Pinto, MD [facility] None    ----------------------------------------------------------------------------------------------------------------------  HPI 65 y.o. female  presents for 3 month follow up on HTN, HLD, history of prediabetes, post  surgical hypothyroid (following thyroid cancer) and vitamin D deficiency.   Patient had been diagnosed in the past with Pseudotumor cerebri consequent of a HA evaluated LP with slightly elevated CSF OP and was treated with Diamox. Last Eval at [facility] discounted that Dx with a Dx/o anomalous Optic Discs and not indeed papilloedema. Has been released by neurology and only follow up if needed.   she currently continues to smoke 0-2 cigarettes a day; discussed risks associated with smoking, patient is ready to quit.   She is taking wellbutrin 300mg  XL daily to help with this.  She would like to reduce this dose. Discussed taking 150XL daily.   BMI is Body mass index is 31.39 kg/m., she has been working on diet and exercise, She reports she typically gets in 8000-10000 steps daily.  She was started on phentermine and topiramate; denies SE with phentermine as long as she doesn't drink too much caffeine in the AM. Wt Readings from Last 3 Encounters:  10/10/20 174 lb 6.4 oz (79.1 kg)  06/05/20 170 lb 9.6 oz (77.4 kg)  02/28/20 168 lb (76.2 kg)   Her blood pressure has been controlled at home, taking atenolol 50 mg BID lasix 20 mg, daily, olmesartan 20 mg, today their BP is BP: 126/80  She reports BP at home range from 120-140/70s.   She does not workout. She denies chest pain, shortness of breath, dizziness, fatigue.    She is on cholesterol medication (zetia 10 mg daily, stopped rosuvastatin 40 mg  and denies myalgias. Her cholesterol is not at goal. The cholesterol last visit was:   Lab Results  Component Value Date   CHOL 204 (H) 06/05/2020   HDL 76 06/05/2020  [place] 106 (H) 06/05/2020   TRIG 120 06/05/2020   CHOLHDL 2.7 06/05/2020    She has been working on diet and exercise for history of prediabetes, and denies increased appetite, nausea, paresthesia of the feet, polydipsia, polyuria, visual disturbances and vomiting. Last A1C in the office was:  Lab Results  Component  Value Date   HGBA1C 5.7 (H) 06/05/2020   In 2012, patient had a subtotal thyroidectomy for a MNG and was found to have a small foci of Thyroid cancer. She has been on supressive therapy since. 112 mcg full tab MWF, 1/2 tab all other days- has been stable for a while on this dose   Her medication was not changed last visit.   Lab Results  Component Value Date   TSH 2.53 06/05/2020  . Patient is on Vitamin D supplement and at goal:    Lab Results  Component Value Date   VD25OH 61 06/05/2020      Current Medications:  Current Outpatient Medications on File Prior to Visit  Medication Sig  . aspirin 81 MG tablet Take 81 mg by mouth daily.  Marland Kitchen atenolol (TENORMIN) 100 MG tablet Take 1 tablet     Daily     for BP  . buPROPion (WELLBUTRIN XL) 300 MG 24 hr tablet TAKE 1 TABLET BY MOUTH DAILY FOR MOOD, FOCUS & CONCENTRATION  . Cholecalciferol (VITAMIN D) 2000 units CAPS take 3 caps (6,000 units) / daily (Patient taking differently: 5,000 Units daily.)  . CINNAMON PO Take 1,000 mg by mouth 2 (two) times daily.   Marland Kitchen ezetimibe (ZETIA) 10 MG tablet TAKE 1 TABLET DAILY FOR CHOLESTEROL  . fexofenadine (ALLEGRA) 180 MG tablet Take 180 mg by mouth daily. Patient OTC Allegra PRN  . fluticasone (FLONASE) 50 MCG/ACT nasal spray PLACE 2 SPRAYS INTO BOTH NOSTRILS DAILY.  . furosemide (LASIX) 40 MG tablet TAKE 1 TABLET DAILY FOR BLOOD PRESSURE & FLUID RETENTION (Patient taking differently: TAKE 1/2 TABLET DAILY FOR BLOOD PRESSURE & FLUID RETENTION)  . levothyroxine (SYNTHROID) 112 MCG tablet Take 1 tablet    Daily    on an empty stomach with only water for 30 minutes & no Antacid meds, Calcium or Magnesium for 4 hours & avoid Biotin  . Magnesium 500 MG CAPS Take by mouth daily.  Marland Kitchen MINIVELLE 0.025 MG/24HR   . Multiple Vitamins-Minerals (HAIR/SKIN/NAILS PO) Take by mouth daily. 3 tablets daily  . olmesartan (BENICAR) 40 MG tablet TAKE 1 TABLET BY MOUTH DAILY FOR BLOOD PRESSURE  . Omega-3 Fatty Acids (FISH OIL)  1000 MG CAPS Take by mouth daily.  . phentermine (ADIPEX-P) 37.5 MG tablet Take 1/2 to 1 tablet   Daily    for Dieting & Weight Loss  . rosuvastatin (CRESTOR) 40 MG tablet TAKE 1/2 TO 1 TABLET BY MOUTH DAILY OR AS DIRECTED FOR CHOLESTEROL  . vitamin E 400 UNIT capsule Take 400 Units by mouth daily.  . Zinc 25 MG TABS Take by mouth daily.   No current facility-administered medications on file prior to visit.     Allergies:  Allergies  Allergen Reactions  . Erythromycin Nausea And Vomiting and Other (See Comments)    Diarrhea - more severe     Medical History:  Past Medical History:  Diagnosis Date  . Anxiety disorder   . Cancer ([place])    thyroid  . GERD (gastroesophageal reflux disease)   . History of thyroid cancer   . Hypertension   . Migraine headache   . Pseudopapilledema of  both optic discs    bilateral  . Vitamin D deficiency    Family history- Reviewed and unchanged Social history- Reviewed and unchanged   Review of Systems:  Review of Systems  Constitutional: Negative for malaise/fatigue and weight loss.  HENT: Negative for hearing loss and tinnitus.   Eyes: Negative for blurred vision and double vision.  Respiratory: Negative for cough, shortness of breath and wheezing.   Cardiovascular: Negative for chest pain, palpitations, orthopnea, claudication and leg swelling.  Gastrointestinal: Negative for abdominal pain, blood in stool, constipation, diarrhea, heartburn, melena, nausea and vomiting.  Genitourinary: Negative.   Musculoskeletal: Negative for joint pain and myalgias.  Skin: Negative for rash.  Neurological: Negative for dizziness, tingling, sensory change, weakness and headaches.  Endo/Heme/Allergies: Negative for polydipsia.  Psychiatric/Behavioral: Negative.   All other systems reviewed and are negative.   Physical Exam: BP 126/80   Pulse (!) 52   Temp 97.7 F (36.5 C)   Wt 174 lb 6.4 oz (79.1 kg)   SpO2 100%   BMI 31.39 kg/m  Wt Readings  from Last 3 Encounters:  10/10/20 174 lb 6.4 oz (79.1 kg)  06/05/20 170 lb 9.6 oz (77.4 kg)  02/28/20 168 lb (76.2 kg)   General Appearance: Well nourished, in no apparent distress. Eyes: PERRLA, EOMs, conjunctiva no swelling or erythema Sinuses: No Frontal/maxillary tenderness ENT/Mouth: Ext aud canals clear, TMs without erythema, bulging. No erythema, swelling, or exudate on post pharynx.  Tonsils not swollen or erythematous. Hearing normal.  Neck: Supple, thyroid normal. Respiratory: Respiratory effort normal, BS equal bilaterally without rales, rhonchi, wheezing or stridor.  Cardio: RRR with no MRGs. Brisk peripheral pulses without edema.  Abdomen: Soft, + BS.  Non tender, no guarding, rebound, hernias, masses. Lymphatics: Non tender without lymphadenopathy.  Musculoskeletal: Full ROM, 5/5 strength, Normal gait Skin: Warm, dry without rashes, lesions, ecchymosis.  Neuro: Cranial nerves intact. No cerebellar symptoms.  Psych: Awake and oriented X 3, normal affect, Insight and Judgment appropriate.    Garnet Sierras, NP 12:04 PM [facility]

## 2020-10-11 LAB — COMPLETE METABOLIC PANEL WITH GFR
AG Ratio: 1.4 (calc) (ref 1.0–2.5)
ALT: 31 U/L — ABNORMAL HIGH (ref 6–29)
AST: 19 U/L (ref 10–35)
Albumin: 4 g/dL (ref 3.6–5.1)
Alkaline phosphatase (APISO): 76 U/L (ref 37–153)
BUN: 13 mg/dL (ref 7–25)
CO2: 30 mmol/L (ref 20–32)
Calcium: 8.9 mg/dL (ref 8.6–10.4)
Chloride: 101 mmol/L (ref 98–110)
Creat: 0.98 mg/dL (ref 0.50–0.99)
GFR, Est African American: 71 mL/min/{1.73_m2} (ref >=60)
GFR, Est Non African American: 61 mL/min/{1.73_m2} (ref >=60)
Globulin: 2.9 g/dL (calc) (ref 1.9–3.7)
Glucose, Bld: 94 mg/dL (ref 65–99)
Potassium: 4 mmol/L (ref 3.5–5.3)
Sodium: 139 mmol/L (ref 135–146)
Total Bilirubin: 0.3 mg/dL (ref 0.2–1.2)
Total Protein: 6.9 g/dL (ref 6.1–8.1)

## 2020-10-11 LAB — CBC WITH DIFFERENTIAL/PLATELET
Absolute Monocytes: 302 cells/uL (ref 200–950)
Basophils Absolute: 18 cells/uL (ref 0–200)
Basophils Relative: 0.4 %
Eosinophils Absolute: 122 cells/uL (ref 15–500)
Eosinophils Relative: 2.7 %
HCT: 39.9 % (ref 35.0–45.0)
Hemoglobin: 13.1 g/dL (ref 11.7–15.5)
Lymphs Abs: 1305 cells/uL (ref 850–3900)
MCH: 31.2 pg (ref 27.0–33.0)
MCHC: 32.8 g/dL (ref 32.0–36.0)
MCV: 95 fL (ref 80.0–100.0)
MPV: 11.7 fL (ref 7.5–12.5)
Monocytes Relative: 6.7 %
Neutro Abs: 2754 cells/uL (ref 1500–7800)
Neutrophils Relative %: 61.2 %
Platelets: 261 10*3/uL (ref 140–400)
RBC: 4.2 10*6/uL (ref 3.80–5.10)
RDW: 12.6 % (ref 11.0–15.0)
Total Lymphocyte: 29 %
WBC: 4.5 10*3/uL (ref 3.8–10.8)

## 2020-10-11 LAB — LIPID PANEL
Cholesterol: 214 mg/dL — ABNORMAL HIGH (ref <200)
HDL: 71 mg/dL (ref >=50)
LDL Cholesterol (Calc): 125 mg/dL (calc) — ABNORMAL HIGH
Non-HDL Cholesterol (Calc): 143 mg/dL (calc) — ABNORMAL HIGH (ref <130)
Total CHOL/HDL Ratio: 3 (calc) (ref <5.0)
Triglycerides: 83 mg/dL (ref <150)

## 2020-10-11 LAB — TSH: TSH: 1.77 mIU/L (ref 0.40–4.50)

## 2020-10-14 ENCOUNTER — Other Ambulatory Visit (HOSPITAL_COMMUNITY): Payer: Self-pay

## 2020-10-14 MED FILL — Bupropion HCl Tab ER 24HR 150 MG: ORAL | 90 days supply | Qty: 90 | Fill #0 | Status: AC

## 2020-10-15 ENCOUNTER — Other Ambulatory Visit: Payer: Self-pay

## 2020-10-16 ENCOUNTER — Other Ambulatory Visit (HOSPITAL_COMMUNITY): Payer: Self-pay

## 2020-10-16 ENCOUNTER — Other Ambulatory Visit: Payer: Self-pay | Admitting: Internal Medicine

## 2020-10-16 DIAGNOSIS — I10 Essential (primary) hypertension: Secondary | ICD-10-CM

## 2020-10-16 MED ORDER — ATENOLOL 100 MG PO TABS
ORAL_TABLET | Freq: Every day | ORAL | 2 refills | Status: DC
  Filled 2020-10-16: qty 90, 90d supply, fill #0
  Filled 2021-02-19: qty 90, 90d supply, fill #1
  Filled 2021-05-30: qty 90, 90d supply, fill #2

## 2020-10-16 MED FILL — Furosemide Tab 40 MG: ORAL | 90 days supply | Qty: 90 | Fill #0 | Status: AC

## 2020-11-13 ENCOUNTER — Other Ambulatory Visit (HOSPITAL_COMMUNITY): Payer: Self-pay

## 2020-11-13 MED ORDER — LEVOTHYROXINE SODIUM 112 MCG PO TABS
112.0000 ug | ORAL_TABLET | Freq: Every day | ORAL | 3 refills | Status: DC
  Filled 2020-11-13: qty 90, 90d supply, fill #0

## 2020-11-13 MED FILL — Olmesartan Medoxomil Tab 40 MG: ORAL | 90 days supply | Qty: 90 | Fill #0 | Status: AC

## 2020-11-22 ENCOUNTER — Encounter: Payer: 59 | Admitting: Internal Medicine

## 2020-12-18 DIAGNOSIS — H52222 Regular astigmatism, left eye: Secondary | ICD-10-CM | POA: Diagnosis not present

## 2020-12-18 DIAGNOSIS — H524 Presbyopia: Secondary | ICD-10-CM | POA: Diagnosis not present

## 2020-12-18 DIAGNOSIS — H5201 Hypermetropia, right eye: Secondary | ICD-10-CM | POA: Diagnosis not present

## 2020-12-18 DIAGNOSIS — G932 Benign intracranial hypertension: Secondary | ICD-10-CM | POA: Diagnosis not present

## 2020-12-18 DIAGNOSIS — H5202 Hypermetropia, left eye: Secondary | ICD-10-CM | POA: Diagnosis not present

## 2020-12-22 ENCOUNTER — Other Ambulatory Visit (HOSPITAL_COMMUNITY): Payer: Self-pay

## 2020-12-22 ENCOUNTER — Other Ambulatory Visit: Payer: Self-pay

## 2020-12-25 ENCOUNTER — Other Ambulatory Visit (HOSPITAL_COMMUNITY): Payer: Self-pay

## 2020-12-28 ENCOUNTER — Other Ambulatory Visit (HOSPITAL_COMMUNITY): Payer: Self-pay

## 2020-12-28 ENCOUNTER — Other Ambulatory Visit: Payer: Self-pay | Admitting: Internal Medicine

## 2020-12-28 DIAGNOSIS — E782 Mixed hyperlipidemia: Secondary | ICD-10-CM

## 2020-12-28 MED ORDER — EZETIMIBE 10 MG PO TABS
ORAL_TABLET | ORAL | 3 refills | Status: DC
  Filled 2020-12-28 – 2021-01-12 (×2): qty 90, 90d supply, fill #0
  Filled 2021-06-05 – 2021-06-13 (×2): qty 90, 90d supply, fill #1
  Filled 2021-09-30: qty 90, 90d supply, fill #2

## 2020-12-29 ENCOUNTER — Other Ambulatory Visit (HOSPITAL_COMMUNITY): Payer: Self-pay

## 2020-12-30 ENCOUNTER — Other Ambulatory Visit (HOSPITAL_COMMUNITY): Payer: Self-pay

## 2021-01-01 ENCOUNTER — Other Ambulatory Visit (HOSPITAL_COMMUNITY): Payer: Self-pay

## 2021-01-09 ENCOUNTER — Other Ambulatory Visit (HOSPITAL_COMMUNITY): Payer: Self-pay

## 2021-01-12 ENCOUNTER — Other Ambulatory Visit (HOSPITAL_COMMUNITY): Payer: Self-pay

## 2021-01-17 ENCOUNTER — Encounter: Payer: Self-pay | Admitting: Internal Medicine

## 2021-01-17 NOTE — Progress Notes (Signed)
Annual Screening/Preventative Visit & Comprehensive Evaluation &  Examination  Future Appointments  Date Time Provider [place]  01/18/2021 11:00 AM Unk Pinto, MD [facility] None  01/18/2022 11:00 AM Unk Pinto, MD [facility] None        This very nice 65 y.o. DBF presents for a Screening /Preventative Visit & comprehensive evaluation and management of multiple medical co-morbidities.  Patient has been followed for HTN, HLD, Prediabetes  and Vitamin D Deficiency.    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 Last Eval at [facility] discounted that Dx with a Dx/o anomalous Optic Discs and NOT PAPILLEDEMA.]]]        HTN predates circa 1992.  Patient's BP has been controlled at home and patient denies any cardiac symptoms as chest pain, palpitations, shortness of breath, dizziness or ankle swelling. Today's BP is at goal - 126/76.        Patient's hyperlipidemia is not controlled with diet and Rosuvastatin. Patient denies myalgias or other medication SE's. Last lipids were not at goal ( ? off meds):  Lab Results  Component Value Date   CHOL 214 (H) 10/10/2020   HDL 71 10/10/2020   LDLCALC 125 (H) 10/10/2020   TRIG 83 10/10/2020   CHOLHDL 3.0 10/10/2020        Patient has hx/o prediabetes (A1c 5.8% /2011) and patient denies reactive hypoglycemic symptoms, visual blurring, diabetic polys or paresthesias. 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 She has been on supressive therapy since.        Finally, patient has history of Vitamin D Deficiency and last Vitamin D was at goal:  Lab Results  Component Value Date   VD25OH 61 06/05/2020      Current Outpatient Medications on File Prior to Visit  Medication Sig   aspirin 81 MG tablet Take  daily.   atenolol (TENORMIN) 100 MG tablet TAKE 1 TABLET DAILY   buPROPion-XL  150 MG  TAKE 1 TABLET  DAILY   VITAMIN D 2000 units CAPS Takes 5,000 Units daily   CINNAMON 1,000 mg  Take  2 times daily.    estradiol (VIVELLE-DOT) 0.025 MG/24HR APPLY 1 PATCH TWICE A WEEK   ezetimibe  10 MG tablet Take 1 tablet daily for cholesterol   fexofenadine 180 MG tablet Take daily.    fFLONASE  nasal spray PLACE 2 SPRAYS INTO BOTH NOSTRILS DAILY.   furosemide (LASIX) 40 MG tablet TAKE 1 TABLET DAILY    levothyroxine 112 MCG tablet Take 1 tablet daily    Magnesium 500 MG CAPS Take daily.   MINIVELLE 0.025 MG/24HR    HAIR/SKIN/NAILS  Take 3 tablets daily   olmesartan 40 MG tablet TAKE 1/2 TO 1 TABLET DAILY    Omega-3 FISH OIL 1000 MG CAPS Take daily.   phentermine 37.5 MG tablet TAKE 1/2 TO 1 TABLET DAILY    rosuvastatin 40 MG tablet TAKE 1/2 TO 1 TABLET DAILY  vitamin E 400 UNIT Take daily.   Zinc 25 MG TABS Take by mouth daily.     Allergies  Allergen Reactions   Erythromycin Nausea And Vomiting and Other (See Comments)    Diarrhea - more severe    Past Medical History:  Diagnosis Date   Anxiety disorder    Cancer ([place])    thyroid   GERD (gastroesophageal reflux disease)    History of thyroid cancer    Hypertension    Migraine headache    Pseudopapilledema of both optic discs    bilateral   Vitamin D deficiency      Health Maintenance  Topic Date Due   Pneumococcal Vaccine 63-58 Years old (1 - PCV) Never done   Zoster Vaccines- Shingrix (1 of 2) Never done   PAP SMEAR-Modifier  04/18/2018   MAMMOGRAM  06/02/2019   COVID-19 Vaccine (3 - Pfizer risk series) 08/20/2019   INFLUENZA VACCINE  02/12/2021   TETANUS/TDAP  05/02/2025   COLONOSCOPY (Pts 45-59yrs Insurance coverage will need to be confirmed)  08/12/2027   Hepatitis C Screening  Completed   HIV Screening   Completed   HPV VACCINES  Aged Out     Immunization History  Administered Date(s) Administered   DTaP 12/14/1994   Hepatitis B 07/15/1989   Influenza-Unspecified 04/29/2017   PFIZER(Purple Top)SARS-COV-2 Vaccination 07/05/2019, 07/23/2019   PPD Test 07/18/2014, 08/04/2015, 10/05/2018, 11/22/2019   Pneumococcal Polysaccharide-23 10/09/1998   Tdap 05/03/2015    Last Colon - 08/11/2017 - Dr Collene Mares - recommended 10 yr f/u due Feb 2029   Last MGM  & Pap Jan 2021 - Dr Raliegh Ip  [facility]   Past Surgical History:  Procedure Laterality Date   ABDOMINAL HYSTERECTOMY  2005   THYROIDECTOMY  04/25/11    Family History  Problem Relation Age of Onset   Diabetes Mother    Heart disease Mother    Hypertension Mother    Hyperlipidemia Mother    COPD Mother    Depression Father    Suicidality Father    Stroke Brother    Heart disease Brother    Asthma Son    Cancer Maternal Aunt        lung    Social History   Tobacco Use   Smoking status: Light Smoker    Packs/day: 0.20    Pack years: 0.00    Types: Cigarettes   Smokeless tobacco: Never   Tobacco comments:    1-2 cigarettes per day  Substance Use Topics   Alcohol use: Yes    Comment: weekends   Drug use: No      ROS Constitutional: Denies fever, chills, weight loss/gain, headaches, insomnia,  night sweats, and change in appetite. Does c/o fatigue. Eyes: Denies redness, blurred vision, diplopia, discharge, itchy, watery eyes.  ENT: Denies discharge, congestion, post nasal drip, epistaxis, sore throat, earache, hearing loss, dental pain, Tinnitus, Vertigo, Sinus pain, snoring.  Cardio: Denies chest pain, palpitations, irregular heartbeat, syncope, dyspnea, diaphoresis, orthopnea, PND, claudication, edema Respiratory: denies cough, dyspnea, DOE, pleurisy, hoarseness, laryngitis, wheezing.  Gastrointestinal: Denies dysphagia, heartburn, reflux, water brash, pain, cramps, nausea, vomiting, bloating, diarrhea,  constipation, hematemesis, melena, hematochezia, jaundice, hemorrhoids Genitourinary: Denies dysuria, frequency, urgency, nocturia, hesitancy, discharge, hematuria, flank pain Breast: Breast lumps, nipple discharge, bleeding.  Musculoskeletal: Denies arthralgia, myalgia, stiffness, Jt. Swelling, pain, limp, and strain/sprain. Denies falls. Skin: Denies puritis, rash, hives, warts, acne, eczema, changing in skin lesion Neuro: No weakness, tremor, incoordination, spasms, paresthesia, pain Psychiatric: Denies confusion, memory loss,  sensory loss. Denies Depression. Endocrine: Denies change in weight, skin, hair change, nocturia, and paresthesia, diabetic polys, visual blurring, hyper / hypo glycemic episodes.  Heme/Lymph: No excessive bleeding, bruising, enlarged lymph nodes.  Physical Exam  BP 126/76   Pulse 61   Temp 97.7 F (36.5 C)   Resp 16   Ht 5\' 2"  (1.575 m)   Wt 176 lb 3.2 oz (79.9 kg)   SpO2 99%   BMI 32.23 kg/m   General Appearance: Well nourished, well groomed and in no apparent distress.  Eyes: PERRLA, EOMs, conjunctiva no swelling or erythema, normal fundi and vessels. Sinuses: No frontal/maxillary tenderness ENT/Mouth: EACs patent / TMs  nl. Nares clear without erythema, swelling, mucoid exudates. Oral hygiene is good. No erythema, swelling, or exudate. Tongue normal, non-obstructing. Tonsils not swollen or erythematous. Hearing normal.  Neck: Supple, thyroid not palpable. No bruits, nodes or JVD. Respiratory: Respiratory effort normal.  BS equal and clear bilateral without rales, rhonci, wheezing or stridor. Cardio: Heart sounds are normal with regular rate and rhythm and no murmurs, rubs or gallops. Peripheral pulses are normal and equal bilaterally without edema. No aortic or femoral bruits. Chest: symmetric with normal excursions and percussion. Breasts: Symmetric, without lumps, nipple discharge, retractions, or fibrocystic changes.  Abdomen: Flat, soft with bowel  sounds active. Nontender, no guarding, rebound, hernias, masses, or organomegaly.  Lymphatics: Non tender without lymphadenopathy.  Musculoskeletal: Full ROM all peripheral extremities, joint stability, 5/5 strength, and normal gait. Skin: Warm and dry without rashes, lesions, cyanosis, clubbing or  ecchymosis.  Neuro: Cranial nerves intact, reflexes equal bilaterally. Normal muscle tone, no cerebellar symptoms. Sensation intact.  Pysch: Alert and oriented X 3, normal affect, Insight and Judgment appropriate.    Assessment and Plan  1. Annual Preventative Screening Examination   2. Essential hypertension  - EKG 12-Lead - [place], RETROPERITNL ABD,  LTD - Urinalysis, Routine w reflex microscopic - Microalbumin / creatinine urine ratio - CBC with Differential/Platelet - COMPLETE METABOLIC PANEL WITH GFR - Magnesium - TSH  3. Hyperlipidemia, mixed  - EKG 12-Lead - [place], RETROPERITNL ABD,  LTD - Lipid panel  4. Abnormal glucose  - EKG 12-Lead - [place], RETROPERITNL ABD,  LTD - Hemoglobin A1c - Insulin, random  5. Vitamin D deficiency  - VITAMIN D 25 Hydroxy   6. Hypothyroidism  - TSH  7. Screening-pulmonary TB  - TB Skin Test  8. Screening for colorectal cancer  - POC Hemoccult Bld/Stl   9. Screening for ischemic heart disease  - EKG 12-Lead  10. FHx: heart disease  - EKG 12-Lead - [place], RETROPERITNL ABD,  LTD  11. Smoker  - EKG 12-Lead - [place], RETROPERITNL ABD,  LTD  12. Screening for AAA (aortic abdominal aneurysm)  - [place], RETROPERITNL ABD,  LTD  13. Fatigue, unspecified type  - Iron, Total/Total Iron Binding Cap - Vitamin B12 - CBC with Differential/Platelet - TSH  14. Medication management  - CBC with Differential/Platelet - COMPLETE METABOLIC PANEL WITH GFR - Magnesium - Lipid panel - TSH - Hemoglobin A1c - Insulin, random - VITAMIN D 25 Hydroxy            Patient was counseled in prudent diet to achieve/maintain BMI less than 25 for weight  control, BP monitoring, regular exercise and medications. Discussed med's effects and SE's. Screening labs and tests as requested with regular follow-up as recommended. Over 40 minutes of exam, counseling, chart review and high complex critical decision making was performed.   Lendon Ka  Melford Aase, MD

## 2021-01-17 NOTE — Patient Instructions (Signed)
Due to recent changes in healthcare laws, you may see the results of your imaging and laboratory studies on MyChart before your provider has had a chance to review them.  We understand that in some cases there may be results that are confusing or concerning to you. Not all laboratory results come back in the same time frame and the provider may be waiting for multiple results in order to interpret others.  Please give [place] 48 hours in order for your provider to thoroughly review all the results before contacting the office for clarification of your results.   ++++++++++++++++++++++++++++++  Vit D  & Vit C 1,000 mg   are recommended to help protect  against the Covid-19 and other Corona viruses.    Also it's recommended  to take  Zinc 50 mg  to help  protect against the Covid-19   and best place to get  is also on [email or web address]  and don't pay more than 6-8 cents /pill !  ================================ Coronavirus (COVID-19) Are you at risk?  Are you at risk for the Coronavirus (COVID-19)?  To be considered HIGH RISK for Coronavirus (COVID-19), you have to meet the following criteria:  Traveled to [place], [place], [place], [place] or [place]; or in the [place] to [place], [place], [place]  or [place]; and have fever, cough, and shortness of breath within the last 2 weeks of travel OR Been in close contact with a person diagnosed with COVID-19 within the last 2 weeks and have  fever, cough,and shortness of breath  IF YOU DO NOT MEET THESE CRITERIA, YOU ARE CONSIDERED LOW RISK FOR COVID-19.  What to do if you are HIGH RISK for COVID-19?  If you are having a medical emergency, call 911. Seek medical care right away. Before you go to a doctor's office, urgent care or emergency department,  call ahead and tell them about your recent travel, contact with someone diagnosed with COVID-19   and your symptoms.  You should receive instructions from your physician's office regarding  next steps of care.  When you arrive at healthcare provider, tell the healthcare staff immediately you have returned from  visiting [place], [place], [place], [place] or [place]; or traveled in the [place] to [place], [place],  [place] or [place] in the last two weeks or you have been in close contact with a person diagnosed with  COVID-19 in the last 2 weeks.   Tell the health care staff about your symptoms: fever, cough and shortness of breath. After you have been seen by a medical provider, you will be either: Tested for (COVID-19) and discharged home on quarantine except to seek medical care if  symptoms worsen, and asked to  Stay home and avoid contact with others until you get your results (4-5 days)  Avoid travel on public transportation if possible (such as bus, train, or airplane) or Sent to the Emergency Department by EMS for evaluation, COVID-19 testing  and  possible admission depending on your condition and test results.  What to do if you are LOW RISK for COVID-19?  Reduce your risk of any infection by using the same precautions used for avoiding the common cold or flu:  Wash your hands often with soap and warm water for at least 20 seconds.  If soap and water are not readily available,  use an alcohol-based hand sanitizer with at least 60% alcohol.  If coughing or sneezing, cover your mouth and nose by coughing  or sneezing into the elbow areas of your shirt or coat,  into a tissue or into your sleeve (not your hands). Avoid shaking hands with others and consider head nods or verbal greetings only. Avoid touching your eyes, nose, or mouth with unwashed hands.  Avoid close contact with people who are sick. Avoid places or events with large numbers of people in one location, like concerts or sporting events. Carefully consider travel plans you have or are making. If you are planning any travel outside or inside the [place], visit the CDC's Travelers' Health webpage for  the latest health notices. If you have some symptoms but not all symptoms, continue to monitor at home and seek medical attention  if your symptoms worsen. If you are having a medical emergency, call 911. >>>>>>>>>>>>>>>>>>>>>>> Preventive Care for Adults  A healthy lifestyle and preventive care can promote health and wellness. Preventive health guidelines for women include the following key practices. A routine yearly physical is a good way to check with your health care provider about your health and preventive screening. It is a chance to share any concerns and updates on your health and to receive a thorough exam. Visit your dentist for a routine exam and preventive care every 6 months. Brush your teeth twice a day and floss once a day. Good oral hygiene prevents tooth decay and gum disease. The frequency of eye exams is based on your age, health, family medical history, use of contact lenses, and other factors. Follow your health care provider's recommendations for frequency of eye exams. Eat a healthy diet. Foods like vegetables, fruits, whole grains, low-fat dairy products, and lean protein foods contain the nutrients you need without too many calories. Decrease your intake of foods high in solid fats, added sugars, and salt. Eat the right amount of calories for you. Get information about a proper diet from your health care provider, if necessary. Regular physical exercise is one of the most important things you can do for your health. Most adults should get at least 150 minutes of moderate-intensity exercise (any activity that increases your heart rate and causes you to sweat) each week. In addition, most adults need muscle-strengthening exercises on 2 or more days a week. Maintain a healthy weight. The body mass index (BMI) is a screening tool to identify possible weight problems. It provides an estimate of body fat based on height and weight. Your health care provider can find your BMI and can  help you achieve or maintain a healthy weight. For adults 20 years and older: A BMI below 18.5 is considered underweight. A BMI of 18.5 to 24.9 is normal. A BMI of 25 to 29.9 is considered overweight. A BMI of 30 and above is considered obese. Maintain normal blood lipids and cholesterol levels by exercising and minimizing your intake of saturated fat. Eat a balanced diet with plenty of fruit and vegetables. Blood tests for lipids and cholesterol should begin at age 43 and be repeated every 5 years. If your lipid or cholesterol levels are high, you are over 50, or you are at high risk for heart disease, you may need your cholesterol levels checked more frequently. Ongoing high lipid and cholesterol levels should be treated with medicines if diet and exercise are not working. If you smoke, find out from your health care provider how to quit. If you do not use tobacco, do not start. Lung cancer screening is recommended for adults aged 11-80 years who are at high risk for developing  lung cancer because of a history of smoking. A yearly low-dose CT scan of the lungs is recommended for people who have at least a 30-pack-year history of smoking and are a current smoker or have quit within the past 15 years. A pack year of smoking is smoking an average of 1 pack of cigarettes a day for 1 year (for example: 1 pack a day for 30 years or 2 packs a day for 15 years). Yearly screening should continue until the smoker has stopped smoking for at least 15 years. Yearly screening should be stopped for people who develop a health problem that would prevent them from having lung cancer treatment. High blood pressure causes heart disease and increases the risk of stroke. Your blood pressure should be checked at least every 1 to 2 years. Ongoing high blood pressure should be treated with medicines if weight loss and exercise do not work. If you are 32-3 years old, ask your health care provider if you should take aspirin to  prevent strokes. Diabetes screening involves taking a blood sample to check your fasting blood sugar level. This should be done once every 3 years, after age 32, if you are within normal weight and without risk factors for diabetes. Testing should be considered at a younger age or be carried out more frequently if you are overweight and have at least 1 risk factor for diabetes. Breast cancer screening is essential preventive care for women. You should practice "breast self-awareness." This means understanding the normal appearance and feel of your breasts and may include breast self-examination. Any changes detected, no matter how small, should be reported to a health care provider. Women in their 45s and 30s should have a clinical breast exam (CBE) by a health care provider as part of a regular health exam every 1 to 3 years. After age 83, women should have a CBE every year. Starting at age 92, women should consider having a mammogram (breast X-ray test) every year. Women who have a family history of breast cancer should talk to their health care provider about genetic screening. Women at a high risk of breast cancer should talk to their health care providers about having an MRI and a mammogram every year. Breast cancer gene (BRCA)-related cancer risk assessment is recommended for women who have family members with BRCA-related cancers. BRCA-related cancers include breast, ovarian, tubal, and peritoneal cancers. Having family members with these cancers may be associated with an increased risk for harmful changes (mutations) in the breast cancer genes BRCA1 and BRCA2. Results of the assessment will determine the need for genetic counseling and BRCA1 and BRCA2 testing. Routine pelvic exams to screen for cancer are no longer recommended for nonpregnant women who are considered low risk for cancer of the pelvic organs (ovaries, uterus, and vagina) and who do not have symptoms. Ask your health care provider if a  screening pelvic exam is right for you. If you have had past treatment for cervical cancer or a condition that could lead to cancer, you need Pap tests and screening for cancer for at least 20 years after your treatment. If Pap tests have been discontinued, your risk factors (such as having a new sexual partner) need to be reassessed to determine if screening should be resumed. Some women have medical problems that increase the chance of getting cervical cancer. In these cases, your health care provider may recommend more frequent screening and Pap tests. Colorectal cancer can be detected and often prevented. Most routine colorectal  cancer screening begins at the age of 50 years and continues through age 75 years. However, your health care provider may recommend screening at an earlier age if you have risk factors for colon cancer. On a yearly basis, your health care provider may provide home test kits to check for hidden blood in the stool. Use of a small camera at the end of a tube, to directly examine the colon (sigmoidoscopy or colonoscopy), can detect the earliest forms of colorectal cancer. Talk to your health care provider about this at age 50, when routine screening begins.  Direct exam of the colon should be repeated every 5-10 years through age 75 years, unless early forms of pre-cancerous polyps or small growths are found. Hepatitis C blood testing is recommended for all people born from 1945 through 1965 and any individual with known risks for hepatitis C. Pra Osteoporosis is a disease in which the bones lose minerals and strength with aging. This can result in serious bone fractures or breaks. The risk of osteoporosis can be identified using a bone density scan. Women ages 65 years and over and women at risk for fractures or osteoporosis should discuss screening with their health care providers. Ask your health care provider whether you should take a calcium supplement or vitamin D to reduce the  rate of osteoporosis. Menopause can be associated with physical symptoms and risks. Hormone replacement therapy is available to decrease symptoms and risks. You should talk to your health care provider about whether hormone replacement therapy is right for you. Use sunscreen. Apply sunscreen liberally and repeatedly throughout the day. You should seek shade when your shadow is shorter than you. Protect yourself by wearing long sleeves, pants, a wide-brimmed hat, and sunglasses year round, whenever you are outdoors. Once a month, do a whole body skin exam, using a mirror to look at the skin on your back. Tell your health care provider of new moles, moles that have irregular borders, moles that are larger than a pencil eraser, or moles that have changed in shape or color. Stay current with required vaccines (immunizations). Influenza vaccine. All adults should be immunized every year. Tetanus, diphtheria, and acellular pertussis (Td, Tdap) vaccine. Pregnant women should receive 1 dose of Tdap vaccine during each pregnancy. The dose should be obtained regardless of the length of time since the last dose. Immunization is preferred during the 27th-36th week of gestation. An adult who has not previously received Tdap or who does not know her vaccine status should receive 1 dose of Tdap. This initial dose should be followed by tetanus and diphtheria toxoids (Td) booster doses every 10 years. Adults with an unknown or incomplete history of completing a 3-dose immunization series with Td-containing vaccines should begin or complete a primary immunization series including a Tdap dose. Adults should receive a Td booster every 10 years. Varicella vaccine. An adult without evidence of immunity to varicella should receive 2 doses or a second dose if she has previously received 1 dose. Pregnant females who do not have evidence of immunity should receive the first dose after pregnancy. This first dose should be obtained  before leaving the health care facility. The second dose should be obtained 4-8 weeks after the first dose. Human papillomavirus (HPV) vaccine. Females aged 13-26 years who have not received the vaccine previously should obtain the 3-dose series. The vaccine is not recommended for use in pregnant females. However, pregnancy testing is not needed before receiving a dose. If a female is found to   be pregnant after receiving a dose, no treatment is needed. In that case, the remaining doses should be delayed until after the pregnancy. Immunization is recommended for any person with an immunocompromised condition through the age of 26 years if she did not get any or all doses earlier. During the 3-dose series, the second dose should be obtained 4-8 weeks after the first dose. The third dose should be obtained 24 weeks after the first dose and 16 weeks after the second dose. Zoster vaccine. One dose is recommended for adults aged 60 years or older unless certain conditions are present. Measles, mumps, and rubella (MMR) vaccine. Adults born before 1957 generally are considered immune to measles and mumps. Adults born in 1957 or later should have 1 or more doses of MMR vaccine unless there is a contraindication to the vaccine or there is laboratory evidence of immunity to each of the three diseases. A routine second dose of MMR vaccine should be obtained at least 28 days after the first dose for students attending postsecondary schools, health care workers, or international travelers. People who received inactivated measles vaccine or an unknown type of measles vaccine during 1963-1967 should receive 2 doses of MMR vaccine. People who received inactivated mumps vaccine or an unknown type of mumps vaccine before 1979 and are at high risk for mumps infection should consider immunization with 2 doses of MMR vaccine. For females of childbearing age, rubella immunity should be determined. If there is no evidence of immunity,  females who are not pregnant should be vaccinated. If there is no evidence of immunity, females who are pregnant should delay immunization until after pregnancy. Unvaccinated health care workers born before 1957 who lack laboratory evidence of measles, mumps, or rubella immunity or laboratory confirmation of disease should consider measles and mumps immunization with 2 doses of MMR vaccine or rubella immunization with 1 dose of MMR vaccine. Pneumococcal 13-valent conjugate (PCV13) vaccine. When indicated, a person who is uncertain of her immunization history and has no record of immunization should receive the PCV13 vaccine. An adult aged 19 years or older who has certain medical conditions and has not been previously immunized should receive 1 dose of PCV13 vaccine. This PCV13 should be followed with a dose of pneumococcal polysaccharide (PPSV23) vaccine. The PPSV23 vaccine dose should be obtained at least 1 or more year(s) after the dose of PCV13 vaccine. An adult aged 19 years or older who has certain medical conditions and previously received 1 or more doses of PPSV23 vaccine should receive 1 dose of PCV13. The PCV13 vaccine dose should be obtained 1 or more years after the last PPSV23 vaccine dose.  Pneumococcal polysaccharide (PPSV23) vaccine. When PCV13 is also indicated, PCV13 should be obtained first. All adults aged 65 years and older should be immunized. An adult younger than age 65 years who has certain medical conditions should be immunized. Any person who resides in a nursing home or long-term care facility should be immunized. An adult smoker should be immunized. People with an immunocompromised condition and certain other conditions should receive both PCV13 and PPSV23 vaccines. People with human immunodeficiency virus (HIV) infection should be immunized as soon as possible after diagnosis. Immunization during chemotherapy or radiation therapy should be avoided. Routine use of PPSV23 vaccine is  not recommended for American Indians, [place] Natives, or people younger than 65 years unless there are medical conditions that require PPSV23 vaccine. When indicated, people who have unknown immunization and have no record of immunization should receive   PPSV23 vaccine. One-time revaccination 5 years after the first dose of PPSV23 is recommended for people aged 19-64 years who have chronic kidney failure, nephrotic syndrome, asplenia, or immunocompromised conditions. People who received 1-2 doses of PPSV23 before age 56 years should receive another dose of PPSV23 vaccine at age 89 years or later if at least 5 years have passed since the previous dose. Doses of PPSV23 are not needed for people immunized with PPSV23 at or after age 70 years.  Preventive Services / Frequency  Ages 96 to 55 years Blood pressure check. Lipid and cholesterol check. Lung cancer screening. / Every year if you are aged 80-80 years and have a 30-pack-year history of smoking and currently smoke or have quit within the past 15 years. Yearly screening is stopped once you have quit smoking for at least 15 years or develop a health problem that would prevent you from having lung cancer treatment. Clinical breast exam.** / Every year after age 23 years.  BRCA-related cancer risk assessment.** / For women who have family members with a BRCA-related cancer (breast, ovarian, tubal, or peritoneal cancers). Mammogram.** / Every year beginning at age 27 years and continuing for as long as you are in good health. Consult with your health care provider. Pap test.** / Every 3 years starting at age 89 years through age 28 or 28 years with a history of 3 consecutive normal Pap tests. HPV screening.** / Every 3 years from ages 52 years through ages 70 to 10 years with a history of 3 consecutive normal Pap tests. Fecal occult blood test (FOBT) of stool. / Every year beginning at age 67 years and continuing until age 43 years. You may not need to do  this test if you get a colonoscopy every 10 years. Flexible sigmoidoscopy or colonoscopy.** / Every 5 years for a flexible sigmoidoscopy or every 10 years for a colonoscopy beginning at age 47 years and continuing until age 39 years. Hepatitis C blood test.** / For all people born from [birth year] through 1965 and any individual with known risks for hepatitis C. Skin self-exam. / Monthly. Influenza vaccine. / Every year. Tetanus, diphtheria, and acellular pertussis (Tdap/Td) vaccine.** / Consult your health care provider. Pregnant women should receive 1 dose of Tdap vaccine during each pregnancy. 1 dose of Td every 10 years. Varicella vaccine.** / Consult your health care provider. Pregnant females who do not have evidence of immunity should receive the first dose after pregnancy. Zoster vaccine.** / 1 dose for adults aged 32 years or older. Pneumococcal 13-valent conjugate (PCV13) vaccine.** / Consult your health care provider. Pneumococcal polysaccharide (PPSV23) vaccine.** / 1 to 2 doses if you smoke cigarettes or if you have certain conditions. Meningococcal vaccine.** / Consult your health care provider. Hepatitis A vaccine.** / Consult your health care provider. Hepatitis B vaccine.** / Consult your health care provider. Screening for abdominal aortic aneurysm (AAA)  by ultrasound is recommended for people over 50 who have history of high blood pressure or who are current or former smokers. ++++++++++++++++++ Recommend Adult Low Dose Aspirin or  coated  Aspirin 81 mg daily  To reduce risk of Colon Cancer 40 %,  Skin Cancer 26 % ,  Melanoma 46%  and  Pancreatic cancer 60% +++++++++++++++++++ Vitamin D goal  is between 70-100.  Please make sure that you are taking your Vitamin D as directed.  It is very important as a natural anti-inflammatory  helping hair, skin, and nails, as well as reducing stroke and heart  attack risk.  It helps your bones and helps with mood. It also decreases  numerous cancer risks so please take it as directed.  Low Vit D is associated with a 200-300% higher risk for CANCER  and 200-300% higher risk for HEART   ATTACK  &  STROKE.   ...................................... It is also associated with higher death rate at younger ages,  autoimmune diseases like Rheumatoid arthritis, Lupus, Multiple Sclerosis.    Also many other serious conditions, like depression, Alzheimer's Dementia, infertility, muscle aches, fatigue, fibromyalgia - just to name a few. ++++++++++++++++++ Recommend the book "The END of DIETING" by Dr Joel Fuhrman  & the book "The END of DIABETES " by Dr Joel Fuhrman At [employer] - get book & Audio CD's    Being diabetic has a  300% increased risk for heart attack, stroke, cancer, and alzheimer- type vascular dementia. It is very important that you work harder with diet by avoiding all foods that are white. Avoid white rice (brown & wild rice is OK), white potatoes (sweetpotatoes in moderation is OK), White bread or wheat bread or anything made out of white flour like bagels, donuts, rolls, buns, biscuits, cakes, pastries, cookies, pizza crust, and pasta (made from white flour & egg whites) - vegetarian pasta or spinach or wheat pasta is OK. Multigrain breads like Arnold's or [employer], or multigrain sandwich thins or flatbreads.  Diet, exercise and weight loss can reverse and cure diabetes in the early stages.  Diet, exercise and weight loss is very important in the control and prevention of complications of diabetes which affects every system in your body, ie. Brain - dementia/stroke, eyes - glaucoma/blindness, heart - heart attack/heart failure, kidneys - dialysis, stomach - gastric paralysis, intestines - malabsorption, nerves - severe painful neuritis, circulation - gangrene & loss of a leg(s), and finally cancer and Alzheimers.    I recommend avoid fried & greasy foods,  sweets/candy, white rice (brown or wild rice or Quinoa is  OK), white potatoes (sweet potatoes are OK) - anything made from white flour - bagels, doughnuts, rolls, buns, biscuits,white and wheat breads, pizza crust and traditional pasta made of white flour & egg white(vegetarian pasta or spinach or wheat pasta is OK).  Multi-grain bread is OK - like multi-grain flat bread or sandwich thins. Avoid alcohol in excess. Exercise is also important.    Eat all the vegetables you want - avoid meat, especially red meat and dairy - especially cheese.  Cheese is the most concentrated form of trans-fats which is the worst thing to clog up our arteries. Veggie cheese is OK which can be found in the fresh produce section at [employer] or [employer] or [employer]  ++++++++++++++++++++++ DASH Eating Plan  DASH stands for "Dietary Approaches to Stop Hypertension."   The DASH eating plan is a healthy eating plan that has been shown to reduce high blood pressure (hypertension). Additional health benefits may include reducing the risk of type 2 diabetes mellitus, heart disease, and stroke. The DASH eating plan may also help with weight loss. WHAT DO I NEED TO KNOW ABOUT THE DASH EATING PLAN? For the DASH eating plan, you will follow these general guidelines: Choose foods with a percent daily value for sodium of less than 5% (as listed on the food label). Use salt-free seasonings or herbs instead of table salt or sea salt. Check with your health care provider or pharmacist before using salt substitutes. Eat lower-sodium products, often labeled as "lower sodium" or "no   salt added." Eat fresh foods. Eat more vegetables, fruits, and low-fat dairy products. Choose whole grains. Look for the word "whole" as the first word in the ingredient list. Choose fish  Limit sweets, desserts, sugars, and sugary drinks. Choose heart-healthy fats. Eat veggie cheese  Eat more home-cooked food and less restaurant, buffet, and fast food. Limit fried foods. Cook foods using methods other  than frying. Limit canned vegetables. If you do use them, rinse them well to decrease the sodium. When eating at a restaurant, ask that your food be prepared with less salt, or no salt if possible.                      WHAT FOODS CAN I EAT? Read Dr Joel Fuhrman's books on The End of Dieting & The End of Diabetes  Grains Whole grain or whole wheat bread. Brown rice. Whole grain or whole wheat pasta. Quinoa, bulgur, and whole grain cereals. Low-sodium cereals. Corn or whole wheat flour tortillas. Whole grain cornbread. Whole grain crackers. Low-sodium crackers.  Vegetables Fresh or frozen vegetables (raw, steamed, roasted, or grilled). Low-sodium or reduced-sodium tomato and vegetable juices. Low-sodium or reduced-sodium tomato sauce and paste. Low-sodium or reduced-sodium canned vegetables.   Fruits All fresh, canned (in natural juice), or frozen fruits.  Protein Products  All fish and seafood.  Dried beans, peas, or lentils. Unsalted nuts and seeds. Unsalted canned beans.  Dairy Low-fat dairy products, such as skim or 1% milk, 2% or reduced-fat cheeses, low-fat ricotta or cottage cheese, or plain low-fat yogurt. Low-sodium or reduced-sodium cheeses.  Fats and Oils Tub margarines without trans fats. Light or reduced-fat mayonnaise and salad dressings (reduced sodium). Avocado. Safflower, olive, or canola oils. Natural peanut or almond butter.  Other Unsalted popcorn and pretzels. The items listed above may not be a complete list of recommended foods or beverages. Contact your dietitian for more options.  ++++++++++++++++++  WHAT FOODS ARE NOT RECOMMENDED? Grains/ White flour or wheat flour White bread. White pasta. White rice. Refined cornbread. Bagels and croissants. Crackers that contain trans fat.  Vegetables  Creamed or fried vegetables. Vegetables in a . Regular canned vegetables. Regular canned tomato sauce and paste. Regular tomato and vegetable juices.  Fruits Dried  fruits. Canned fruit in light or heavy syrup. Fruit juice.  Meat and Other Protein Products Meat in general - RED meat & White meat.  Fatty cuts of meat. Ribs, chicken wings, all processed meats as bacon, sausage, bologna, salami, fatback, hot dogs, bratwurst and packaged luncheon meats.  Dairy Whole or 2% milk, cream, half-and-half, and cream cheese. Whole-fat or sweetened yogurt. Full-fat cheeses or blue cheese. Non-dairy creamers and whipped toppings. Processed cheese, cheese spreads, or cheese curds.  Condiments Onion and garlic salt, seasoned salt, table salt, and sea salt. Canned and packaged gravies. Worcestershire sauce. Tartar sauce. Barbecue sauce. Teriyaki sauce. Soy sauce, including reduced sodium. Steak sauce. Fish sauce. Oyster sauce. Cocktail sauce. Horseradish. Ketchup and mustard. Meat flavorings and tenderizers. Bouillon cubes. Hot sauce. Tabasco sauce. Marinades. Taco seasonings. Relishes.  Fats and Oils Butter, stick margarine, lard, shortening and bacon fat. Coconut, palm kernel, or palm oils. Regular salad dressings.  Pickles and olives. Salted popcorn and pretzels.  The items listed above may not be a complete list of foods and beverages to avoid.   

## 2021-01-18 ENCOUNTER — Ambulatory Visit (INDEPENDENT_AMBULATORY_CARE_PROVIDER_SITE_OTHER): Payer: Federal, State, Local not specified - PPO | Admitting: Internal Medicine

## 2021-01-18 ENCOUNTER — Encounter: Payer: Self-pay | Admitting: Internal Medicine

## 2021-01-18 ENCOUNTER — Other Ambulatory Visit: Payer: Self-pay

## 2021-01-18 VITALS — BP 126/76 | HR 61 | Temp 97.7°F | Resp 16 | Ht 62.0 in | Wt 176.2 lb

## 2021-01-18 DIAGNOSIS — Z79899 Other long term (current) drug therapy: Secondary | ICD-10-CM | POA: Diagnosis not present

## 2021-01-18 DIAGNOSIS — F17200 Nicotine dependence, unspecified, uncomplicated: Secondary | ICD-10-CM

## 2021-01-18 DIAGNOSIS — E559 Vitamin D deficiency, unspecified: Secondary | ICD-10-CM

## 2021-01-18 DIAGNOSIS — Z8249 Family history of ischemic heart disease and other diseases of the circulatory system: Secondary | ICD-10-CM | POA: Diagnosis not present

## 2021-01-18 DIAGNOSIS — Z0000 Encounter for general adult medical examination without abnormal findings: Secondary | ICD-10-CM

## 2021-01-18 DIAGNOSIS — Z0001 Encounter for general adult medical examination with abnormal findings: Secondary | ICD-10-CM

## 2021-01-18 DIAGNOSIS — Z136 Encounter for screening for cardiovascular disorders: Secondary | ICD-10-CM

## 2021-01-18 DIAGNOSIS — E039 Hypothyroidism, unspecified: Secondary | ICD-10-CM

## 2021-01-18 DIAGNOSIS — I10 Essential (primary) hypertension: Secondary | ICD-10-CM

## 2021-01-18 DIAGNOSIS — Z1389 Encounter for screening for other disorder: Secondary | ICD-10-CM

## 2021-01-18 DIAGNOSIS — E782 Mixed hyperlipidemia: Secondary | ICD-10-CM

## 2021-01-18 DIAGNOSIS — R7309 Other abnormal glucose: Secondary | ICD-10-CM

## 2021-01-18 DIAGNOSIS — Z1329 Encounter for screening for other suspected endocrine disorder: Secondary | ICD-10-CM | POA: Diagnosis not present

## 2021-01-18 DIAGNOSIS — Z111 Encounter for screening for respiratory tuberculosis: Secondary | ICD-10-CM

## 2021-01-18 DIAGNOSIS — R5383 Other fatigue: Secondary | ICD-10-CM

## 2021-01-18 DIAGNOSIS — Z131 Encounter for screening for diabetes mellitus: Secondary | ICD-10-CM | POA: Diagnosis not present

## 2021-01-18 DIAGNOSIS — Z1211 Encounter for screening for malignant neoplasm of colon: Secondary | ICD-10-CM

## 2021-01-18 DIAGNOSIS — Z130 Encounter for screening for diseases of the blood and blood-forming organs and certain disorders involving the immune mechanism: Secondary | ICD-10-CM

## 2021-01-18 DIAGNOSIS — Z13220 Encounter for screening for lipoid disorders: Secondary | ICD-10-CM | POA: Diagnosis not present

## 2021-01-19 LAB — CBC WITH DIFFERENTIAL/PLATELET
Absolute Monocytes: 358 cells/uL (ref 200–950)
Basophils Absolute: 28 cells/uL (ref 0–200)
Basophils Relative: 0.5 %
Eosinophils Absolute: 83 cells/uL (ref 15–500)
Eosinophils Relative: 1.5 %
HCT: 42.3 % (ref 35.0–45.0)
Hemoglobin: 13.7 g/dL (ref 11.7–15.5)
Lymphs Abs: 1485 cells/uL (ref 850–3900)
MCH: 31.6 pg (ref 27.0–33.0)
MCHC: 32.4 g/dL (ref 32.0–36.0)
MCV: 97.7 fL (ref 80.0–100.0)
MPV: 11.9 fL (ref 7.5–12.5)
Monocytes Relative: 6.5 %
Neutro Abs: 3548 cells/uL (ref 1500–7800)
Neutrophils Relative %: 64.5 %
Platelets: 282 10*3/uL (ref 140–400)
RBC: 4.33 10*6/uL (ref 3.80–5.10)
RDW: 12.6 % (ref 11.0–15.0)
Total Lymphocyte: 27 %
WBC: 5.5 10*3/uL (ref 3.8–10.8)

## 2021-01-19 LAB — LIPID PANEL
Cholesterol: 178 mg/dL (ref <200)
HDL: 80 mg/dL (ref >=50)
LDL Cholesterol (Calc): 80 mg/dL (calc)
Non-HDL Cholesterol (Calc): 98 mg/dL (calc) (ref <130)
Total CHOL/HDL Ratio: 2.2 (calc) (ref <5.0)
Triglycerides: 96 mg/dL (ref <150)

## 2021-01-19 LAB — COMPLETE METABOLIC PANEL WITH GFR
AG Ratio: 1.5 (calc) (ref 1.0–2.5)
ALT: 28 U/L (ref 6–29)
AST: 20 U/L (ref 10–35)
Albumin: 4.3 g/dL (ref 3.6–5.1)
Alkaline phosphatase (APISO): 85 U/L (ref 37–153)
BUN/Creatinine Ratio: 12 (calc) (ref 6–22)
BUN: 14 mg/dL (ref 7–25)
CO2: 29 mmol/L (ref 20–32)
Calcium: 9.3 mg/dL (ref 8.6–10.4)
Chloride: 104 mmol/L (ref 98–110)
Creat: 1.15 mg/dL — ABNORMAL HIGH (ref 0.50–0.99)
GFR, Est African American: 58 mL/min/{1.73_m2} — ABNORMAL LOW (ref >=60)
GFR, Est Non African American: 50 mL/min/{1.73_m2} — ABNORMAL LOW (ref >=60)
Globulin: 2.9 g/dL (calc) (ref 1.9–3.7)
Glucose, Bld: 92 mg/dL (ref 65–99)
Potassium: 4.6 mmol/L (ref 3.5–5.3)
Sodium: 142 mmol/L (ref 135–146)
Total Bilirubin: 0.4 mg/dL (ref 0.2–1.2)
Total Protein: 7.2 g/dL (ref 6.1–8.1)

## 2021-01-19 LAB — URINALYSIS, ROUTINE W REFLEX MICROSCOPIC
Bilirubin Urine: NEGATIVE
Glucose, UA: NEGATIVE
Hgb urine dipstick: NEGATIVE
Ketones, ur: NEGATIVE
Leukocytes,Ua: NEGATIVE
Nitrite: NEGATIVE
Protein, ur: NEGATIVE
Specific Gravity, Urine: 1.01 (ref 1.001–1.035)
pH: 6 (ref 5.0–8.0)

## 2021-01-19 LAB — TSH: TSH: 4.04 mIU/L (ref 0.40–4.50)

## 2021-01-19 LAB — VITAMIN B12: Vitamin B-12: 879 pg/mL (ref 200–1100)

## 2021-01-19 LAB — IRON, TOTAL/TOTAL IRON BINDING CAP
%SAT: 29 % (calc) (ref 16–45)
Iron: 101 ug/dL (ref 45–160)
TIBC: 353 mcg/dL (calc) (ref 250–450)

## 2021-01-19 LAB — MAGNESIUM: Magnesium: 2.3 mg/dL (ref 1.5–2.5)

## 2021-01-19 LAB — MICROALBUMIN / CREATININE URINE RATIO
Creatinine, Urine: 28 mg/dL (ref 20–275)
Microalb, Ur: <0.2 mg/dL

## 2021-01-19 LAB — INSULIN, RANDOM: Insulin: 13 u[IU]/mL

## 2021-01-19 LAB — HEMOGLOBIN A1C
Hgb A1c MFr Bld: 5.5 % of total Hgb (ref <5.7)
Mean Plasma Glucose: 111 mg/dL
eAG (mmol/L): 6.2 mmol/L

## 2021-01-19 LAB — VITAMIN D 25 HYDROXY (VIT D DEFICIENCY, FRACTURES): Vit D, 25-Hydroxy: 54 ng/mL (ref 30–100)

## 2021-01-19 NOTE — Progress Notes (Signed)
============================================================ -   Test results slightly outside the reference range are not unusual. If there is anything important, I will review this with you,  otherwise it is considered normal test values.  If you have further questions,  please do not hesitate to contact me at the office or via My Chart.  ============================================================ ============================================================  -  Iron & Vitamin B12 levels both Normal & OK  ============================================================ ============================================================  - Kidney functions still look a little dehydrated        GFR 58 - Stage 3a, but almost back in Stage 2    Very important to drink adequate amounts of fluids to prevent permanent damage    - Recommend drink at least 6 bottles (16 ounces) of fluids /water /day = 96 Oz ~100 oz  - 100 oz = 3,000 cc or 3 liters / day  - >> That's 1 &1/2 bottles of a 2 liter soda bottle /day !  ============================================================ ============================================================  -  Lipid s much much BETTER !   Total Chol down fro 214 to 178    And   - Bad /[place] LDL Chol  - down from 125 to now 80  -  Excellent   - Very low risk for Heart Attack  / Stroke ============================================================ ============================================================  -  A1c = 5.5% - Back in Normal, nonDiabetic range - Wonderful ! ============================================================ ============================================================  -  Vitamin D = 54 - a little low   -   Vitamin D goal is between 70-100.   - Please Increase your Vitamin D 5,000 unit capsules to                                                                     2 capsules = 10,000 units /day   - It is very important as a natural  anti-inflammatory and helping the  immune system protect against viral infections, like the Covid-19    helping hair, skin, and nails, as well as reducing stroke and  heart attack risk.   - It helps your bones and helps with mood.  - It also decreases numerous cancer risks so please  take it as directed.   - Low Vit D is associated with a 200-300% higher risk for  CANCER   and 200-300% higher risk for HEART   ATTACK  &  STROKE.    - It is also associated with higher death rate at younger ages,   autoimmune diseases like Rheumatoid arthritis, Lupus,  Multiple Sclerosis.     - Also many other serious conditions, like depression, Alzheimer's  Dementia, infertility, muscle aches, fatigue, fibromyalgia   - just to name a few. ============================================================ ============================================================  -  All Else - CBC - Kidneys - Electrolytes - Liver - Magnesium & Thyroid    - all  Normal / OK ============================================================ ============================================================

## 2021-01-22 ENCOUNTER — Other Ambulatory Visit (INDEPENDENT_AMBULATORY_CARE_PROVIDER_SITE_OTHER): Payer: Federal, State, Local not specified - PPO

## 2021-01-22 DIAGNOSIS — Z0000 Encounter for general adult medical examination without abnormal findings: Secondary | ICD-10-CM

## 2021-01-22 DIAGNOSIS — Z111 Encounter for screening for respiratory tuberculosis: Secondary | ICD-10-CM | POA: Diagnosis not present

## 2021-01-23 ENCOUNTER — Other Ambulatory Visit: Payer: Self-pay | Admitting: Internal Medicine

## 2021-01-23 DIAGNOSIS — E6609 Other obesity due to excess calories: Secondary | ICD-10-CM

## 2021-01-24 MED ORDER — PHENTERMINE HCL 37.5 MG PO TABS
ORAL_TABLET | ORAL | 1 refills | Status: DC
  Filled 2021-01-24: qty 90, 90d supply, fill #0

## 2021-01-25 ENCOUNTER — Other Ambulatory Visit (HOSPITAL_COMMUNITY): Payer: Self-pay

## 2021-02-06 ENCOUNTER — Other Ambulatory Visit: Payer: Self-pay

## 2021-02-06 DIAGNOSIS — Z1211 Encounter for screening for malignant neoplasm of colon: Secondary | ICD-10-CM

## 2021-02-06 LAB — POC HEMOCCULT BLD/STL (HOME/3-CARD/SCREEN)
Card #2 Fecal Occult Blod, POC: NEGATIVE
Card #3 Fecal Occult Blood, POC: NEGATIVE
Fecal Occult Blood, POC: NEGATIVE

## 2021-02-16 ENCOUNTER — Other Ambulatory Visit (HOSPITAL_COMMUNITY): Payer: Self-pay

## 2021-02-16 MED FILL — Olmesartan Medoxomil Tab 40 MG: ORAL | 90 days supply | Qty: 90 | Fill #1 | Status: AC

## 2021-02-19 ENCOUNTER — Other Ambulatory Visit (HOSPITAL_COMMUNITY): Payer: Self-pay

## 2021-02-19 MED FILL — Estradiol TD Patch Twice Weekly 0.025 MG/24HR: TRANSDERMAL | 84 days supply | Qty: 24 | Fill #0 | Status: AC

## 2021-02-19 MED FILL — Bupropion HCl Tab ER 24HR 150 MG: ORAL | 90 days supply | Qty: 90 | Fill #1 | Status: AC

## 2021-03-15 ENCOUNTER — Other Ambulatory Visit: Payer: Self-pay | Admitting: Internal Medicine

## 2021-03-15 ENCOUNTER — Other Ambulatory Visit: Payer: Self-pay | Admitting: Adult Health

## 2021-03-15 ENCOUNTER — Other Ambulatory Visit (HOSPITAL_COMMUNITY): Payer: Self-pay

## 2021-03-15 DIAGNOSIS — Z85850 Personal history of malignant neoplasm of thyroid: Secondary | ICD-10-CM

## 2021-03-15 DIAGNOSIS — R1319 Other dysphagia: Secondary | ICD-10-CM

## 2021-03-15 DIAGNOSIS — R1314 Dysphagia, pharyngoesophageal phase: Secondary | ICD-10-CM

## 2021-03-15 MED ORDER — FUROSEMIDE 40 MG PO TABS
ORAL_TABLET | ORAL | 3 refills | Status: DC
  Filled 2021-03-15 – 2021-03-29 (×2): qty 90, 90d supply, fill #0
  Filled 2021-08-29: qty 90, 90d supply, fill #1
  Filled 2022-01-05: qty 90, 90d supply, fill #2

## 2021-03-23 ENCOUNTER — Other Ambulatory Visit (HOSPITAL_COMMUNITY): Payer: Self-pay

## 2021-03-29 ENCOUNTER — Other Ambulatory Visit (HOSPITAL_COMMUNITY): Payer: Self-pay

## 2021-04-02 ENCOUNTER — Ambulatory Visit (HOSPITAL_COMMUNITY)
Admission: RE | Admit: 2021-04-02 | Discharge: 2021-04-02 | Disposition: A | Discharge status: Home / Self Care | Payer: Federal, State, Local not specified - PPO | Source: Ambulatory Visit | Attending: Internal Medicine | Admitting: Internal Medicine

## 2021-04-02 ENCOUNTER — Other Ambulatory Visit: Payer: Self-pay

## 2021-04-02 DIAGNOSIS — Z85850 Personal history of malignant neoplasm of thyroid: Secondary | ICD-10-CM | POA: Insufficient documentation

## 2021-04-02 DIAGNOSIS — R1314 Dysphagia, pharyngoesophageal phase: Secondary | ICD-10-CM | POA: Insufficient documentation

## 2021-04-02 DIAGNOSIS — E890 Postprocedural hypothyroidism: Secondary | ICD-10-CM | POA: Diagnosis not present

## 2021-04-02 NOTE — Progress Notes (Signed)
============================================================ ============================================================  -    Thyroid U/S looks OK - No Suspicious Changes  - Great News  ============================================================ ============================================================

## 2021-04-09 ENCOUNTER — Other Ambulatory Visit: Payer: Self-pay | Admitting: Internal Medicine

## 2021-04-09 ENCOUNTER — Other Ambulatory Visit (HOSPITAL_COMMUNITY): Payer: Self-pay

## 2021-04-09 MED ORDER — LEVOTHYROXINE SODIUM 112 MCG PO TABS
112.0000 ug | ORAL_TABLET | Freq: Every day | ORAL | 3 refills | Status: DC
  Filled 2021-04-09: qty 90, 90d supply, fill #0
  Filled 2021-08-09: qty 90, 90d supply, fill #1
  Filled 2021-12-04: qty 90, 90d supply, fill #2
  Filled 2022-04-01: qty 90, 90d supply, fill #3

## 2021-05-09 ENCOUNTER — Other Ambulatory Visit (HOSPITAL_COMMUNITY): Payer: Self-pay

## 2021-05-09 ENCOUNTER — Other Ambulatory Visit: Payer: Self-pay | Admitting: Internal Medicine

## 2021-05-09 MED ORDER — FLUTICASONE PROPIONATE 50 MCG/ACT NA SUSP
2.0000 | Freq: Every day | NASAL | 1 refills | Status: AC
  Filled 2021-05-09: qty 32, 60d supply, fill #0
  Filled 2021-05-18: qty 48, 90d supply, fill #0

## 2021-05-17 ENCOUNTER — Other Ambulatory Visit (HOSPITAL_COMMUNITY): Payer: Self-pay

## 2021-05-18 ENCOUNTER — Other Ambulatory Visit (HOSPITAL_COMMUNITY): Payer: Self-pay

## 2021-05-30 ENCOUNTER — Other Ambulatory Visit: Payer: Self-pay | Admitting: Internal Medicine

## 2021-05-30 ENCOUNTER — Other Ambulatory Visit (HOSPITAL_COMMUNITY): Payer: Self-pay

## 2021-05-30 DIAGNOSIS — I10 Essential (primary) hypertension: Secondary | ICD-10-CM

## 2021-05-30 MED ORDER — OLMESARTAN MEDOXOMIL 40 MG PO TABS
ORAL_TABLET | ORAL | 3 refills | Status: DC
  Filled 2021-05-30: qty 90, 90d supply, fill #0
  Filled 2021-09-30: qty 90, 90d supply, fill #1
  Filled 2022-01-07: qty 90, 90d supply, fill #2
  Filled 2022-05-21: qty 90, 90d supply, fill #3

## 2021-05-30 MED FILL — Bupropion HCl Tab ER 24HR 150 MG: ORAL | 90 days supply | Qty: 90 | Fill #2 | Status: AC

## 2021-06-05 ENCOUNTER — Other Ambulatory Visit (HOSPITAL_COMMUNITY): Payer: Self-pay

## 2021-06-13 ENCOUNTER — Other Ambulatory Visit (HOSPITAL_COMMUNITY): Payer: Self-pay

## 2021-07-22 ENCOUNTER — Encounter: Payer: Self-pay | Admitting: Internal Medicine

## 2021-07-22 NOTE — Progress Notes (Addendum)
Future Appointments  Date Time Provider Department  07/23/2021 11:30 AM Unk Pinto, MD [facility]  01/18/2022       CPE  11:00 AM Unk Pinto, MD [facility]    History of Present Illness:       This very nice 66 y.o. MBF  presents for 6  month follow up with HTN, HLD, Pre-Diabetes and Vitamin D Deficiency.        Patient is treated for HTN  (1992) & BP has been controlled at home. Todays BP is at goal -  136/74. Patient has had no complaints of any cardiac type chest pain, palpitations, dyspnea Vertell Limber /PND, dizziness, claudication, or dependent edema.       Hyperlipidemia is controlled with diet & Rosuvastatin. Patient denies myalgias or other med SEs. Last Lipids were at goal :  Lab Results  Component Value Date   CHOL 178 01/18/2021   HDL 80 01/18/2021   LDLCALC 80 01/18/2021   TRIG 96 01/18/2021   CHOLHDL 2.2 01/18/2021     Also, the patient has history of PreDiabetes (A1c 5.8% /2011) and has had no symptoms of reactive hypoglycemia, diabetic polys, paresthesias or visual blurring.  Last A1c was at goal :  Lab Results  Component Value Date   HGBA1C 5.5 01/18/2021                                                        Further, the patient also has history of Vitamin D Deficiency and supplements vitamin D without any suspected side-effects. Last vitamin D was sl low (goal 70-100) :   Lab Results  Component Value Date   VD25OH 54 01/18/2021     Current Outpatient Medications on File Prior to Visit  Medication Sig   aspirin 81 MG tablet Take daily.   atenolol  100 MG tablet TAKE 1 TABLET DAILY   buPROPion XL 150 MG  TAKE 1 TABLET DAILY IN THE MORNING   VITAMIN D 2000 units CAPS 5,000 Units daily   CINNAMON PO Take 1,000 mg 2 times daily.    estradiol (VIVELLE-DOT) 0.025 MG/24HR APPLY 1 PATCH TWICE A WEEK   ezetimibe \\10  MG tablet Take 1 tablet daily for cholesterol   fexofenadine 180 MG tablet Take  Allegra PRN   Flonase  PLACE 2 SPRAYS INTO   NOSTRILS DAILY.   furosemide 40 MG tablet TAKE 1 TABLET DAILY    levothyroxine  112 MCG tablet Take 1 tablet daily    Magnesium 500 MG CAPS Take  daily.   MINIVELLE 0.025 MG/24HR    Multiple Vitamins-Minerals  Take  3 tablets daily   olmesartan  40 MG tablet Take 1 tablet  daily for blood pressure.   Omega-3 Fatty Acids 1000 MG CAPS Take daily.   phentermine  37.5 MG tablet Take  1/2 to 1 tablet very morning    rosuvastatin  40 MG tablet TAKE 1/2 TO 1 TABLET  DAILY    vitamin E 400 UNIT capsule Take  daily.   Zinc 25 MG TABS Take  daily.     Allergies  Allergen Reactions   Erythromycin Nausea And Vomiting     Diarrhea - more severe    PMHx:   Diagnosis Date   Anxiety disorder     Cancer (HCC)      thyroid  GERD (gastroesophageal reflux disease)     History of thyroid cancer     Hypertension     Migraine headache     Pseudopapilledema of both optic discs      bilateral   Vitamin D deficiency                                   Immunization History  Administered Date(s) Administered   DTaP 12/14/1994   Hepatitis B 07/15/1989   Influenza 04/29/2017   PFIZER SARS-COV-2 Vacc 07/05/2019, 07/23/2019   PPD Test 10/05/2018, 11/22/2019, 01/18/2021   Pneumococcal-23 10/09/1998   Tdap 05/03/2015    Past Surgical History:  Procedure Laterality Date   ABDOMINAL HYSTERECTOMY  2005   THYROIDECTOMY  04/25/11    FHx:    Reviewed / unchanged  SHx:    Reviewed / unchanged   Systems Review:  Constitutional: Denies fever, chills, wt changes, headaches, insomnia, fatigue, night sweats, change in appetite. Eyes: Denies redness, blurred vision, diplopia, discharge, itchy, watery eyes.  ENT: Denies discharge, congestion, post nasal drip, epistaxis, sore throat, earache, hearing loss, dental pain, tinnitus, vertigo, sinus pain, snoring.  CV: Denies chest pain, palpitations, irregular heartbeat, syncope, dyspnea, diaphoresis, orthopnea, PND, claudication or edema. Respiratory: denies  cough, dyspnea, DOE, pleurisy, hoarseness, laryngitis, wheezing.  Gastrointestinal: Denies dysphagia, odynophagia, heartburn, reflux, water brash, abdominal pain or cramps, nausea, vomiting, bloating, diarrhea, constipation, hematemesis, melena, hematochezia  or hemorrhoids. Genitourinary: Denies dysuria, frequency, urgency, nocturia, hesitancy, discharge, hematuria or flank pain. Musculoskeletal: Denies arthralgias, myalgias, stiffness, jt. swelling, pain, limping or strain/sprain.  Skin: Denies pruritus, rash, hives, warts, acne, eczema or change in skin lesion(s). Neuro: No weakness, tremor, incoordination, spasms, paresthesia or pain. Psychiatric: Denies confusion, memory loss or sensory loss. Endo: Denies change in weight, skin or hair change.  Heme/Lymph: No excessive bleeding, bruising or enlarged lymph nodes.  Physical Exam  BP 136/74    Pulse 67    Temp 97.9 F (36.6 C)    Resp 16    Ht 5\' 2"  (1.575 m)    Wt 180 lb 12.8 oz (82 kg)    SpO2 97%    BMI 33.07 kg/m   Appears  well nourished, well groomed  and in no distress.  Eyes: PERRLA, EOMs, conjunctiva no swelling or erythema. Sinuses: No frontal/maxillary tenderness ENT/Mouth: EAC's clear, TM's nl w/o erythema, bulging. Nares clear w/o erythema, swelling, exudates. Oropharynx clear without erythema or exudates. Oral hygiene is good. Tongue normal, non obstructing. Hearing intact.  Neck: Supple. Thyroid not palpable. Car 2+/2+ without bruits, nodes or JVD. Chest: Respirations nl with BS clear & equal w/o rales, rhonchi, wheezing or stridor.  Cor: Heart sounds normal w/ regular rate and rhythm without sig. murmurs, gallops, clicks or rubs. Peripheral pulses normal and equal  without edema.  Abdomen: Soft & bowel sounds normal. Non-tender w/o guarding, rebound, hernias, masses or organomegaly.  Lymphatics: Unremarkable.  Musculoskeletal: Full ROM all peripheral extremities, joint stability, 5/5 strength and normal gait.  Skin: Warm,  dry without exposed rashes, lesions or ecchymosis apparent.  Neuro: Cranial nerves intact, reflexes equal bilaterally. Sensory-motor testing grossly intact. Tendon reflexes grossly intact.  Pysch: Alert & oriented x 3.  Insight and judgement nl & appropriate. No ideations.  Assessment and Plan:  1. Essential hypertension  - Continue medication, monitor blood pressure at home.  - Continue DASH diet.  Reminder to go to the ER if any CP,  SOB, nausea, dizziness, severe HA, changes vision/speech.   - CBC with Differential/Platelet - COMPLETE METABOLIC PANEL WITH GFR - Magnesium  2. Hyperlipidemia, mixed  - Continue diet/meds, exercise,& lifestyle modifications.  - Continue monitor periodic cholesterol/liver & renal functions    - Lipid panel  3. Hypothyroidism  - TSH  4. Abnormal glucose  - Continue diet, exercise  - Lifestyle modifications.  - Monitor appropriate labs.  - hemoglobin A1c - Insulin, random  5. Vitamin D deficiency  - Continue supplementation.  6. Medication management  - CBC with Differential/Platelet - COMPLETE METABOLIC PANEL WITH GFR - Magnesium - Lipid panel - TSH - Hemoglobin A1c - Insulin, random          Discussed  regular exercise, BP monitoring, weight control to achieve/maintain BMI less than 25 and discussed med and SE's. Recommended labs to assess and monitor clinical status with further disposition pending results of labs.  I discussed the assessment and treatment plan with the patient. The patient was provided an opportunity to ask questions and all were answered. The patient agreed with the plan and demonstrated an understanding of the instructions.  I provided over 30 minutes of exam, counseling, chart review and  complex critical decision making.        The patient was advised to call back or seek an in-person evaluation if the symptoms worsen or if the condition fails to improve as anticipated.   Kirtland Bouchard, MD

## 2021-07-22 NOTE — Patient Instructions (Signed)

## 2021-07-23 ENCOUNTER — Other Ambulatory Visit: Payer: Self-pay

## 2021-07-23 ENCOUNTER — Ambulatory Visit (INDEPENDENT_AMBULATORY_CARE_PROVIDER_SITE_OTHER): Payer: Federal, State, Local not specified - PPO | Admitting: Internal Medicine

## 2021-07-23 ENCOUNTER — Encounter: Payer: Self-pay | Admitting: Internal Medicine

## 2021-07-23 VITALS — BP 136/74 | HR 67 | Temp 97.9°F | Resp 16 | Ht 62.0 in | Wt 180.8 lb

## 2021-07-23 DIAGNOSIS — E782 Mixed hyperlipidemia: Secondary | ICD-10-CM | POA: Diagnosis not present

## 2021-07-23 DIAGNOSIS — R7309 Other abnormal glucose: Secondary | ICD-10-CM

## 2021-07-23 DIAGNOSIS — Z79899 Other long term (current) drug therapy: Secondary | ICD-10-CM

## 2021-07-23 DIAGNOSIS — E039 Hypothyroidism, unspecified: Secondary | ICD-10-CM

## 2021-07-23 DIAGNOSIS — E559 Vitamin D deficiency, unspecified: Secondary | ICD-10-CM

## 2021-07-23 DIAGNOSIS — I10 Essential (primary) hypertension: Secondary | ICD-10-CM | POA: Diagnosis not present

## 2021-07-24 LAB — COMPLETE METABOLIC PANEL WITH GFR
AG Ratio: 1.8 (calc) (ref 1.0–2.5)
ALT: 29 U/L (ref 6–29)
AST: 21 U/L (ref 10–35)
Albumin: 4.6 g/dL (ref 3.6–5.1)
Alkaline phosphatase (APISO): 89 U/L (ref 37–153)
BUN: 14 mg/dL (ref 7–25)
CO2: 28 mmol/L (ref 20–32)
Calcium: 9.3 mg/dL (ref 8.6–10.4)
Chloride: 103 mmol/L (ref 98–110)
Creat: 0.99 mg/dL (ref 0.50–1.05)
Globulin: 2.5 g/dL (calc) (ref 1.9–3.7)
Glucose, Bld: 89 mg/dL (ref 65–99)
Potassium: 4 mmol/L (ref 3.5–5.3)
Sodium: 143 mmol/L (ref 135–146)
Total Bilirubin: 0.3 mg/dL (ref 0.2–1.2)
Total Protein: 7.1 g/dL (ref 6.1–8.1)
eGFR: 64 mL/min/{1.73_m2} (ref >=60)

## 2021-07-24 LAB — CBC WITH DIFFERENTIAL/PLATELET
Absolute Monocytes: 375 cells/uL (ref 200–950)
Basophils Absolute: 28 cells/uL (ref 0–200)
Basophils Relative: 0.5 %
Eosinophils Absolute: 90 cells/uL (ref 15–500)
Eosinophils Relative: 1.6 %
HCT: 41.2 % (ref 35.0–45.0)
Hemoglobin: 13.4 g/dL (ref 11.7–15.5)
Lymphs Abs: 1400 cells/uL (ref 850–3900)
MCH: 31.2 pg (ref 27.0–33.0)
MCHC: 32.5 g/dL (ref 32.0–36.0)
MCV: 95.8 fL (ref 80.0–100.0)
MPV: 12.1 fL (ref 7.5–12.5)
Monocytes Relative: 6.7 %
Neutro Abs: 3707 cells/uL (ref 1500–7800)
Neutrophils Relative %: 66.2 %
Platelets: 266 10*3/uL (ref 140–400)
RBC: 4.3 10*6/uL (ref 3.80–5.10)
RDW: 12.9 % (ref 11.0–15.0)
Total Lymphocyte: 25 %
WBC: 5.6 10*3/uL (ref 3.8–10.8)

## 2021-07-24 LAB — LIPID PANEL
Cholesterol: 153 mg/dL (ref <200)
HDL: 80 mg/dL (ref >=50)
LDL Cholesterol (Calc): 53 mg/dL (calc)
Non-HDL Cholesterol (Calc): 73 mg/dL (calc) (ref <130)
Total CHOL/HDL Ratio: 1.9 (calc) (ref <5.0)
Triglycerides: 110 mg/dL (ref <150)

## 2021-07-24 LAB — INSULIN, RANDOM: Insulin: 31.3 u[IU]/mL — ABNORMAL HIGH

## 2021-07-24 LAB — HEMOGLOBIN A1C
Hgb A1c MFr Bld: 5.6 % of total Hgb (ref <5.7)
Mean Plasma Glucose: 114 mg/dL
eAG (mmol/L): 6.3 mmol/L

## 2021-07-24 LAB — TSH: TSH: 2.53 mIU/L (ref 0.40–4.50)

## 2021-07-24 LAB — MAGNESIUM: Magnesium: 2.1 mg/dL (ref 1.5–2.5)

## 2021-07-24 NOTE — Progress Notes (Signed)
============================================================ °-   Test results slightly outside the reference range are not unusual. If there is anything important, I will review this with you,  otherwise it is considered normal test values.  If you have further questions,  please do not hesitate to contact me at the office or via My Chart.  ============================================================ ============================================================  -  Total Chol = 153   &   LDL Chol = 53 - Both  Excellent   - Very low risk for Heart Attack  / Stroke ============================================================ ============================================================  -   TSH = Thyroid is Normal - Keep dose same  ============================================================ ============================================================  -  A1c -Normal  - No Diabetes - Great ! ============================================================ ============================================================  - All Else - CBC - Kidneys - Electrolytes - Liver - Magnesium & Thyroid    - all  Normal / OK ============================================================ ============================================================  -  Keep up the [place] work !  ============================================================ ============================================================

## 2021-08-09 ENCOUNTER — Other Ambulatory Visit (HOSPITAL_COMMUNITY): Payer: Self-pay

## 2021-08-29 ENCOUNTER — Other Ambulatory Visit (HOSPITAL_COMMUNITY): Payer: Self-pay

## 2021-09-04 ENCOUNTER — Other Ambulatory Visit: Payer: Self-pay | Admitting: Adult Health Nurse Practitioner

## 2021-09-04 ENCOUNTER — Other Ambulatory Visit: Payer: Self-pay | Admitting: Internal Medicine

## 2021-09-04 ENCOUNTER — Other Ambulatory Visit: Payer: Self-pay

## 2021-09-04 ENCOUNTER — Other Ambulatory Visit (HOSPITAL_COMMUNITY): Payer: Self-pay

## 2021-09-04 DIAGNOSIS — Z1231 Encounter for screening mammogram for malignant neoplasm of breast: Secondary | ICD-10-CM | POA: Diagnosis not present

## 2021-09-04 DIAGNOSIS — Z780 Asymptomatic menopausal state: Secondary | ICD-10-CM | POA: Diagnosis not present

## 2021-09-04 DIAGNOSIS — Z01419 Encounter for gynecological examination (general) (routine) without abnormal findings: Secondary | ICD-10-CM | POA: Diagnosis not present

## 2021-09-04 DIAGNOSIS — E6609 Other obesity due to excess calories: Secondary | ICD-10-CM

## 2021-09-04 DIAGNOSIS — N951 Menopausal and female climacteric states: Secondary | ICD-10-CM | POA: Diagnosis not present

## 2021-09-04 DIAGNOSIS — Z79890 Hormone replacement therapy (postmenopausal): Secondary | ICD-10-CM | POA: Diagnosis not present

## 2021-09-04 DIAGNOSIS — Z130 Encounter for screening for diseases of the blood and blood-forming organs and certain disorders involving the immune mechanism: Secondary | ICD-10-CM | POA: Diagnosis not present

## 2021-09-04 LAB — HM MAMMOGRAPHY

## 2021-09-04 MED ORDER — ESTRADIOL 0.025 MG/24HR TD PTTW
MEDICATED_PATCH | TRANSDERMAL | 4 refills | Status: DC
  Filled 2021-09-04 (×2): qty 24, 84d supply, fill #0

## 2021-09-04 MED ORDER — PHENTERMINE HCL 37.5 MG PO TABS
ORAL_TABLET | ORAL | 0 refills | Status: DC
  Filled 2021-09-04: qty 90, 90d supply, fill #0

## 2021-09-04 MED FILL — Bupropion HCl Tab ER 24HR 150 MG: ORAL | 90 days supply | Qty: 90 | Fill #0 | Status: AC

## 2021-09-05 ENCOUNTER — Other Ambulatory Visit (HOSPITAL_COMMUNITY): Payer: Self-pay

## 2021-09-07 ENCOUNTER — Other Ambulatory Visit (HOSPITAL_COMMUNITY): Payer: Self-pay

## 2021-09-30 ENCOUNTER — Other Ambulatory Visit: Payer: Self-pay | Admitting: Adult Health Nurse Practitioner

## 2021-09-30 ENCOUNTER — Other Ambulatory Visit: Payer: Self-pay

## 2021-09-30 DIAGNOSIS — I10 Essential (primary) hypertension: Secondary | ICD-10-CM

## 2021-09-30 MED FILL — Atenolol Tab 100 MG: ORAL | 90 days supply | Qty: 90 | Fill #0 | Status: AC

## 2021-10-01 ENCOUNTER — Other Ambulatory Visit (HOSPITAL_COMMUNITY): Payer: Self-pay

## 2021-10-19 ENCOUNTER — Other Ambulatory Visit (HOSPITAL_COMMUNITY): Payer: Self-pay

## 2021-10-30 ENCOUNTER — Encounter: Payer: Self-pay | Admitting: Adult Health

## 2021-10-30 NOTE — Progress Notes (Deleted)
?MEDICARE ANNUAL WELLNESS VISIT AND FOLLOW UP ? ?Assessment:  ? ?Diagnoses and all orders for this visit: ? ?Welcome to Medicare preventive visit ? ?Essential hypertension ? ?Benign intracranial hypertension ? ?History of thyroid cancer ( 06/2011 )  ? ?Postoperative hypothyroidism ? ?Hyperlipidemia, mixed ? ?Other abnormal glucose (prediabetes) ? ?Vitamin D deficiency ? ?Medication management ? ?Laryngopharyngeal reflux ? ?Smoker (light, 1-2 daily, unmotivated to quit) ? ?Overweight (BMI 25.0-29.9) ? ?Encounter for routine adult health examination with abnormal findings ? ?Screening for cardiovascular condition ? ?Screening for AAA (abdominal aortic aneurysm) ? ? ? ? ? ? ?Over 40 minutes of exam, counseling, chart review and critical decision making was performed ?Future Appointments  ?Date Time Provider [place]  ?10/31/2021 11:30 AM Liane Comber, NP [facility] None  ?02/07/2022  2:00 PM Unk Pinto, MD [facility] None  ? ? ? ?Plan:  ? ?During the course of the visit the patient was educated and counseled about appropriate screening and preventive services including:  ? ?Pneumococcal vaccine  ?Prevnar 13 ?Influenza vaccine ?Td vaccine ?Screening electrocardiogram ?Bone densitometry screening ?Colorectal cancer screening ?Diabetes screening ?Glaucoma screening ?Nutrition counseling  ?Advanced directives: requested ? ? ?Subjective:  ?Tammy Duran is a 66 y.o. female who presents for Medicare Annual Wellness Visit and 3 month follow up. She has Hypothyroidism; Pseudotumor cerebri; Essential hypertension; Hyperlipidemia, mixed; Other abnormal glucose (prediabetes); Vitamin D deficiency; Medication management; Benign intracranial hypertension; Laryngopharyngeal reflux; Overweight (BMI 25.0-29.9); Smoker (light, 1-2 daily, unmotivated to quit); and History of thyroid cancer ( 06/2011 )  on their problem list. ? ? ?Patient had been diagnosed in the past with Pseudotumor cerebri in 2014 consequent of a HA  evaluated LP with slightly elevated CSF OP and was treated with Diamox. Last Eval at [facility] discounted that Dx with a Dx/o anomalous Optic Discs and not indeed papilloedema. Has been released by neurology and only follow up if needed.  ? ?she currently continues to smoke 0-2 cigarettes a day; discussed risks associated with smoking, patient is ready to quit.   She is taking wellbutrin '300mg'$  XL daily to help with this.  She would like to reduce this dose. Discussed taking 150XL daily. ? ?BMI is There is no height or weight on file to calculate BMI., she {HAS HAS VQM:08676} been working on diet and exercise. ?Wt Readings from Last 3 Encounters:  ?07/23/21 180 lb 12.8 oz (82 kg)  ?01/18/21 176 lb 3.2 oz (79.9 kg)  ?10/10/20 174 lb 6.4 oz (79.1 kg)  ? ? ?Her blood pressure {HAS HAS NOT:18834} been controlled at home, today their BP is   ?She {DOES_DOES PPJ:09326} workout. She denies chest pain, shortness of breath, dizziness.  ? ?She {ACTION; IS/IS ZTI:[id number]} on cholesterol medication (***) and denies myalgias. Her cholesterol {ACTION; IS/IS NOT:[id number]} at goal. The cholesterol last visit was:   ?Lab Results  ?Component Value Date  ? CHOL 153 07/23/2021  ? HDL 80 07/23/2021  ? [place] 53 07/23/2021  ? TRIG 110 07/23/2021  ? CHOLHDL 1.9 07/23/2021  ? ?She {Has/has not:18111} been working on diet and exercise for hx of prediabetes, and denies {Symptoms; diabetes w/o none:19199}. Last A1C in the office was:  ?Lab Results  ?Component Value Date  ? HGBA1C 5.6 07/23/2021  ? ?In 2012, patient had a subtotal thyroidectomy for a MNG and was found to have a small foci of Thyroid cancer. She has been on supressive therapy since. 112 mcg full tab MWF, 1/2 tab all other days- has been stable for a  while on this dose  ? Her medication was not changed last visit.   ?Lab Results  ?Component Value Date  ? TSH 2.53 07/23/2021  ? ?Patient is on Vitamin D supplement.   ?Lab Results  ?Component Value Date  ? VD25OH 54  01/18/2021  ?   ? ?Medication Review: ?Current Outpatient Medications on File Prior to Visit  ?Medication Sig Dispense Refill  ? aspirin 81 MG tablet Take 81 mg by mouth daily.    ? atenolol (TENORMIN) 100 MG tablet Take 1 tablet  Daily  for BP 90 tablet 3  ? buPROPion (WELLBUTRIN XL) 150 MG 24 hr tablet TAKE 1 TABLET BY MOUTH ONCE DAILY IN THE MORNING 90 tablet 2  ? Cholecalciferol (VITAMIN D) 2000 units CAPS take 3 caps (6,000 units) / daily (Patient taking differently: 5,000 Units daily.) 30 capsule   ? CINNAMON PO Take 1,000 mg by mouth 2 (two) times daily.     ? estradiol (VIVELLE-DOT) 0.025 MG/24HR APPLY 1 PATCH TWICE A WEEK 24 patch 4  ? estradiol (VIVELLE-DOT) 0.025 MG/24HR APPLY 1 PATCH TWICE A WEEK 24 patch 4  ? ezetimibe (ZETIA) 10 MG tablet Take 1 tablet by mouth daily for cholesterol 90 tablet 3  ? fexofenadine (ALLEGRA) 180 MG tablet Take 180 mg by mouth daily. Patient OTC Allegra PRN    ? fluticasone (FLONASE) 50 MCG/ACT nasal spray PLACE 2 SPRAYS INTO BOTH NOSTRILS DAILY. 48 g 1  ? furosemide (LASIX) 40 MG tablet TAKE 1 TABLET BY MOUTH DAILY FOR BLOOD PRESSURE & FLUID RETENTION 90 tablet 3  ? levothyroxine (SYNTHROID) 112 MCG tablet Take 1 tablet (112 mcg total) by mouth daily on an empty stomach with only water for 30 minutes and no antacids, calcium or magnesium meds for 4 hours and avoid biotin 90 tablet 3  ? Magnesium 500 MG CAPS Take by mouth daily.    ? MINIVELLE 0.025 MG/24HR   3  ? Multiple Vitamins-Minerals (HAIR/SKIN/NAILS PO) Take by mouth daily. 3 tablets daily    ? olmesartan (BENICAR) 40 MG tablet Take 1 tablet by mouth daily for blood pressure. 90 tablet 3  ? Omega-3 Fatty Acids (FISH OIL) 1000 MG CAPS Take by mouth daily.    ? phentermine (ADIPEX-P) 37.5 MG tablet Take  1/2 to 1 tablet  by mouth every morning  for dieting & weight loss 90 tablet 0  ? rosuvastatin (CRESTOR) 40 MG tablet TAKE 1/2 TO 1 TABLET BY MOUTH DAILY OR AS DIRECTED FOR CHOLESTEROL 90 tablet 1  ? vitamin E 400 UNIT  capsule Take 400 Units by mouth daily.    ? Zinc 25 MG TABS Take by mouth daily.    ? ?No current facility-administered medications on file prior to visit.  ? ? ?Allergies  ?Allergen Reactions  ? Erythromycin Nausea And Vomiting and Other (See Comments)  ?  Diarrhea - more severe  ? ? ?Current Problems (verified) ?Patient Active Problem List  ? Diagnosis Date Noted  ? History of thyroid cancer ( 06/2011 )  03/15/2021  ? Smoker (light, 1-2 daily, unmotivated to quit) 01/12/2019  ? Overweight (BMI 25.0-29.9) 07/03/2018  ? Medication management 07/17/2014  ? Essential hypertension 06/28/2013  ? Hyperlipidemia, mixed 06/28/2013  ? Other abnormal glucose (prediabetes) 06/28/2013  ? Vitamin D deficiency 06/28/2013  ? Benign intracranial hypertension 06/14/2013  ? Pseudotumor cerebri 04/19/2013  ? Laryngopharyngeal reflux 12/17/2011  ? Hypothyroidism 02/11/2011  ? ? ?Screening Tests ?Immunization History  ?Administered Date(s) Administered  ? DTaP 12/14/1994  ?  Hepatitis B 07/15/1989  ? Influenza-Unspecified 04/29/2017  ? PFIZER(Purple Top)SARS-COV-2 Vaccination 07/05/2019, 07/23/2019  ? PPD Test 07/18/2014, 08/04/2015, 10/05/2018, 11/22/2019, 01/18/2021  ? Pneumococcal Polysaccharide-23 10/09/1998  ? Tdap 05/03/2015  ? ?Health Maintenance  ?Topic Date Due  ? Zoster Vaccines- Shingrix (1 of 2) Never done  ? Pneumonia Vaccine 66+ Years old (2 - PCV) 10/09/1999  ? PAP SMEAR-Modifier  04/18/2018  ? MAMMOGRAM  06/02/2019  ? COVID-19 Vaccine (3 - Pfizer risk series) 08/20/2019  ? INFLUENZA VACCINE  02/12/2022  ? TETANUS/TDAP  05/02/2025  ? COLONOSCOPY (Pts 45-36yr Insurance coverage will need to be confirmed)  08/12/2027  ? DEXA SCAN  Completed  ? Hepatitis C Screening  Completed  ? HIV Screening  Completed  ? HPV VACCINES  Aged Out  ? ? ?Last colonoscopy: *** ?Mammograms: getting at *** ?Last pap smear/pelvic exam: ***   ?DEXA:*** ? ?Names of Other Physician/Practitioners you currently use: ?1. [facility] here for primary care ?2. ***, eye doctor, last visit *** ?3. ***, dentist, last visit *** ? ?Patient Care Team: ?MUnk Pinto MD as PCP - General (Internal Medicine) ? ?SURGICAL

## 2021-10-31 ENCOUNTER — Ambulatory Visit: Payer: Federal, State, Local not specified - PPO | Admitting: Adult Health

## 2021-10-31 ENCOUNTER — Encounter: Payer: Self-pay | Admitting: Adult Health

## 2021-10-31 VITALS — BP 108/64 | HR 65 | Temp 97.7°F | Wt 182.0 lb

## 2021-10-31 DIAGNOSIS — E890 Postprocedural hypothyroidism: Secondary | ICD-10-CM

## 2021-10-31 DIAGNOSIS — Z0000 Encounter for general adult medical examination without abnormal findings: Secondary | ICD-10-CM

## 2021-10-31 DIAGNOSIS — Z0001 Encounter for general adult medical examination with abnormal findings: Secondary | ICD-10-CM

## 2021-10-31 DIAGNOSIS — Z79899 Other long term (current) drug therapy: Secondary | ICD-10-CM

## 2021-10-31 DIAGNOSIS — I10 Essential (primary) hypertension: Secondary | ICD-10-CM | POA: Diagnosis not present

## 2021-10-31 DIAGNOSIS — E782 Mixed hyperlipidemia: Secondary | ICD-10-CM | POA: Diagnosis not present

## 2021-10-31 DIAGNOSIS — R7309 Other abnormal glucose: Secondary | ICD-10-CM

## 2021-10-31 DIAGNOSIS — Z85850 Personal history of malignant neoplasm of thyroid: Secondary | ICD-10-CM

## 2021-10-31 DIAGNOSIS — Z136 Encounter for screening for cardiovascular disorders: Secondary | ICD-10-CM

## 2021-10-31 DIAGNOSIS — E663 Overweight: Secondary | ICD-10-CM

## 2021-10-31 DIAGNOSIS — G932 Benign intracranial hypertension: Secondary | ICD-10-CM

## 2021-10-31 DIAGNOSIS — K219 Gastro-esophageal reflux disease without esophagitis: Secondary | ICD-10-CM

## 2021-10-31 DIAGNOSIS — F17200 Nicotine dependence, unspecified, uncomplicated: Secondary | ICD-10-CM

## 2021-10-31 DIAGNOSIS — E559 Vitamin D deficiency, unspecified: Secondary | ICD-10-CM

## 2021-10-31 NOTE — Progress Notes (Signed)
?FOLLOW UP 3 MONTH ? ?Assessment and Plan:  ? ?Hypertension ?Continue current meds ?Monitor blood pressure at home; patient to call if consistently greater than 130/80 ?Continue DASH diet - emphasized low sodium and increasing fluid intake  ?Reminder to go to the ER if any CP, SOB, nausea, dizziness, severe HA, changes vision/speech, left arm numbness and tingling and jaw pain. ? ?Cholesterol ?Currently on zetia 10 mg,  rosuvastatin 20 mg 2 days a week ?Discussed pill burden; patient is interested in trying rosuvastation 20 mg  days a week and stopping zetia ?Continue low cholesterol diet and exercise; discussed increasing soluble fiber or adding supplement ?Check lipid panel.  ? ?Prediabetes ?Well controlled by lifestyle changes  ?Continue diet and exercise.  ?Defer A1C this visit to CPE; check CMP ? ?Hypothyroidism/ hx of thyroid cancer s/p resection on suppression therapy  ?continue medications the same ?reminded to take on an empty stomach 30-24mns before food.  ?check TSH level ? ?Vitamin D Def/ osteoporosis prevention ?Continue supplementation ?Has been stable at goal at last few visits; defer vit D level ? ?Tobacco use ?Discussed risks associated with tobacco use and advised to reduce or quit ?Patient is not ready to do so, but advised to consider strongly ?Will follow up at the next visit ? ?Need pneumonia vaccine ?After discussion she would like to do prevnar 13 /pneum 23 ?We no longer carry 13 valent; she can get at pharmacy and forward records ?Will plan to complete 23 valent in 1 year here ? ? ?Continue diet and meds as discussed. Further disposition pending results of labs. Discussed med's effects and SE's.   ?Over 30 minutes of face to face interview, exam, counseling, chart review, and critical decision making was performed.  ? ?Future Appointments  ?Date Time Provider [place] ?02/07/2022  2:00 PM MUnk Pinto MD [facility] None  ?08/09/2022 11:00 AM CLiane Comber NP [facility] None   ? ? ?---------------------------------------------------------------------------------------------------------------------- ? ?HPI ?66y.o. female  presents for 3 month follow up on HTN, HLD, history of prediabetes, post surgical hypothyroid (following thyroid cancer) and vitamin D deficiency. ? ?Patient had been diagnosed in the past with Pseudotumor cerebri consequent of a HA evaluated LP with slightly elevated CSF OP and was treated with Di[facility]ox. [facility]x with a Dx/o anomalous Optic Discs and not indeed papilloedema. Has been released by neurology and only follow up if needed.  ? ?she currently continues to smoke 0-2 cigarettes a day; discussed risks associated with smoking, patient is not ready to quit.   She is taking wellbutrin 150 mg XL for mood and doing well with this.  ? ?BMI is Body mass index is 33.29 kg/m?., she has been working on diet and exercise, She reports she typically gets in 8000-10000 steps daily.  ?She was started on phentermine and topiramate; denies SE with phentermine as long as she doesn't drink too much caffeine in the AM. ?Wt Readings from Last 3 Encounters:  ?10/31/21 182 lb (82.6 kg)  ?07/23/21 180 lb 12.8 oz (82 kg)  ?01/18/21 176 lb 3.2 oz (79.9 kg)  ? ?Her blood pressure has been controlled at home, taking atenolol 50 mg BID lasix 20 mg, daily, olmesartan 20 mg, today their BP is BP: 108/64  ?She reports BP at home range from 120-140/70s.  ? She does not workout. She denies chest pain, shortness of breath, dizziness, fatigue.  ? ? She is on cholesterol medication (zetia 10 mg daily, taking rosuvastatin 2 days  and denies myalgias. Her cholesterol is not at goal. The cholesterol last visit was:   ?Lab Results  ?Component Value Date  ? CHOL 153 07/23/2021  ? HDL 80 07/23/2021  ? [place] 53 07/23/2021  ? TRIG 110 07/23/2021  ? CHOLHDL 1.9 07/23/2021  ? ? She has been working on diet and exercise for history of prediabetes, and denies  increased appetite, nausea, paresthesia of the feet, polydipsia, polyuria, visual disturbances and vomiting. Last A1C in the office was:  ?Lab Results  ?Component Value Date  ? HGBA1C 5.6 07/23/2021  ? ?In 2012, patient had a subtotal thyroidectomy for a MNG and was found to have a small foci of Thyroid cancer. She has been on supressive therapy since. 112 mcg full tab MWF, 1/2 tab all other days- has been stable for a while on this dose  ? Her medication was not changed last visit.   ?Lab Results  ?Component Value Date  ? TSH 2.53 07/23/2021  ?Marland Kitchen ?Patient is on Vitamin D supplement (5000 IU daily) and at goal:    ?Lab Results  ?Component Value Date  ? VD25OH 54 01/18/2021  ?   ? ? ?Current Medications:  ?Current Outpatient Medications on File Prior to Visit  ?Medication Sig  ? aspirin 81 MG tablet Take 81 mg by mouth daily.  ? atenolol (TENORMIN) 100 MG tablet Take 1 tablet  Daily  for BP  ? buPROPion (WELLBUTRIN XL) 150 MG 24 hr tablet TAKE 1 TABLET BY MOUTH ONCE DAILY IN THE MORNING  ? Cholecalciferol (VITAMIN D) 2000 units CAPS take 3 caps (6,000 units) / daily (Patient taking differently: 5,000 Units daily.)  ? CINNAMON PO Take 1,000 mg by mouth 2 (two) times daily.   ? estradiol (VIVELLE-DOT) 0.025 MG/24HR APPLY 1 PATCH TWICE A WEEK  ? estradiol (VIVELLE-DOT) 0.025 MG/24HR APPLY 1 PATCH TWICE A WEEK  ? ezetimibe (ZETIA) 10 MG tablet Take 1 tablet by mouth daily for cholesterol  ? fexofenadine (ALLEGRA) 180 MG tablet Take 180 mg by mouth daily. Patient OTC Allegra PRN  ? fluticasone (FLONASE) 50 MCG/ACT nasal spray PLACE 2 SPRAYS INTO BOTH NOSTRILS DAILY.  ? furosemide (LASIX) 40 MG tablet TAKE 1 TABLET BY MOUTH DAILY FOR BLOOD PRESSURE & FLUID RETENTION  ? levothyroxine (SYNTHROID) 112 MCG tablet Take 1 tablet (112 mcg total) by mouth daily on an empty stomach with only water for 30 minutes and no antacids, calcium or magnesium meds for 4 hours and avoid biotin  ? Magnesium 500 MG CAPS Take by mouth daily.  ?  Multiple Vitamins-Minerals (HAIR/SKIN/NAILS PO) Take by mouth daily. 3 tablets daily  ? olmesartan (BENICAR) 40 MG tablet Take 1 tablet by mouth daily for blood pressure.  ? Omega-3 Fatty Acids (FISH OIL) 1000 MG CAPS Take by mouth daily.  ? phentermine (ADIPEX-P) 37.5 MG tablet Take  1/2 to 1 tablet  by mouth every morning  for dieting & weight loss  ? rosuvastatin (CRESTOR) 40 MG tablet TAKE 1/2 TO 1 TABLET BY MOUTH DAILY OR AS DIRECTED FOR CHOLESTEROL  ? vitamin C (ASCORBIC ACID) 500 MG tablet Take 500 mg by mouth daily.  ? vitamin E 400 UNIT capsule Take 400 Units by mouth daily.  ? Zinc 25 MG TABS Take by mouth daily.  ? MINIVELLE 0.025 MG/24HR   ? ?No current facility-administered medications on file prior to visit.  ? ? ? ?Allergies:  ?Allergies  ?Allergen Reactions  ? Erythromycin Nausea And Vomiting and Other (See Comments)  ?  Diarrhea - more severe  ?  ? ?Medical History:  ?Past Medical History:  ?Diagnosis Date  ? Anxiety disorder   ? Cancer [facility])   ? thyroid  ? GERD (gastroesophageal reflux disease)   ? History of thyroid cancer   ? Hypertension   ? Migraine headache   ? 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 Vitamin D deficiency   ? ?Family history- Reviewed and unchanged ?Social history- Reviewed and unchanged ? ? ?Review of Systems:  ?Review of Systems  ?Constitutional:  Negative for malaise/fatigue and weight loss.  ?HENT:  Negative for hearing loss and tinnitus.   ?Eyes:  Negative for blurred vision and double vision.  ?Respiratory:  Negative for cough, shortness of breath and wheezing.   ?Cardiovascular:  Negative for chest pain, palpitations, orthopnea, claudication and leg swelling.  ?Gastrointestinal:  Negative for abdominal pain, blood in stool, constipation, diarrhea, heartburn, melena, nausea and vomiting.  ?Genitourinary: Negative.   ?Musculoskeletal:  Negative for joint pain and myalgias.  ?Skin:  Negative for rash.   ?Neurological:  Negative for dizziness, tingling, sensory change, weakness and headaches.  ?Endo/Heme/Allergies:  Negative for polydipsia.  ?Psychiatric/Behavioral: Negative.    ?All other systems reviewed and

## 2021-11-01 LAB — CBC WITH DIFFERENTIAL/PLATELET
Absolute Monocytes: 384 cells/uL (ref 200–950)
Basophils Absolute: 38 cells/uL (ref 0–200)
Basophils Relative: 0.6 %
Eosinophils Absolute: 101 cells/uL (ref 15–500)
Eosinophils Relative: 1.6 %
HCT: 42.7 % (ref 35.0–45.0)
Hemoglobin: 14.1 g/dL (ref 11.7–15.5)
Lymphs Abs: 1462 cells/uL (ref 850–3900)
MCH: 31.5 pg (ref 27.0–33.0)
MCHC: 33 g/dL (ref 32.0–36.0)
MCV: 95.3 fL (ref 80.0–100.0)
MPV: 12.1 fL (ref 7.5–12.5)
Monocytes Relative: 6.1 %
Neutro Abs: 4316 cells/uL (ref 1500–7800)
Neutrophils Relative %: 68.5 %
Platelets: 291 10*3/uL (ref 140–400)
RBC: 4.48 10*6/uL (ref 3.80–5.10)
RDW: 12.3 % (ref 11.0–15.0)
Total Lymphocyte: 23.2 %
WBC: 6.3 10*3/uL (ref 3.8–10.8)

## 2021-11-01 LAB — COMPLETE METABOLIC PANEL WITH GFR
AG Ratio: 1.6 (calc) (ref 1.0–2.5)
ALT: 28 U/L (ref 6–29)
AST: 22 U/L (ref 10–35)
Albumin: 4.4 g/dL (ref 3.6–5.1)
Alkaline phosphatase (APISO): 74 U/L (ref 37–153)
BUN/Creatinine Ratio: 14 (calc) (ref 6–22)
BUN: 16 mg/dL (ref 7–25)
CO2: 26 mmol/L (ref 20–32)
Calcium: 9.2 mg/dL (ref 8.6–10.4)
Chloride: 101 mmol/L (ref 98–110)
Creat: 1.16 mg/dL — ABNORMAL HIGH (ref 0.50–1.05)
Globulin: 2.8 g/dL (calc) (ref 1.9–3.7)
Glucose, Bld: 104 mg/dL — ABNORMAL HIGH (ref 65–99)
Potassium: 4.2 mmol/L (ref 3.5–5.3)
Sodium: 139 mmol/L (ref 135–146)
Total Bilirubin: 0.5 mg/dL (ref 0.2–1.2)
Total Protein: 7.2 g/dL (ref 6.1–8.1)
eGFR: 52 mL/min/{1.73_m2} — ABNORMAL LOW (ref >=60)

## 2021-11-01 LAB — SPECIMEN COMPROMISED

## 2021-11-01 LAB — TSH: TSH: 2.89 mIU/L (ref 0.40–4.50)

## 2021-11-01 LAB — LIPID PANEL
Cholesterol: 141 mg/dL (ref <200)
HDL: 73 mg/dL (ref >=50)
LDL Cholesterol (Calc): 54 mg/dL (calc)
Non-HDL Cholesterol (Calc): 68 mg/dL (calc) (ref <130)
Total CHOL/HDL Ratio: 1.9 (calc) (ref <5.0)
Triglycerides: 68 mg/dL (ref <150)

## 2021-11-01 LAB — MAGNESIUM: Magnesium: 2.2 mg/dL (ref 1.5–2.5)

## 2021-11-15 ENCOUNTER — Encounter: Payer: Self-pay | Admitting: Internal Medicine

## 2021-11-22 ENCOUNTER — Encounter: Payer: Self-pay | Admitting: Internal Medicine

## 2021-12-04 ENCOUNTER — Other Ambulatory Visit (HOSPITAL_COMMUNITY): Payer: Self-pay

## 2021-12-04 MED FILL — Bupropion HCl Tab ER 24HR 150 MG: ORAL | 90 days supply | Qty: 90 | Fill #1 | Status: AC

## 2021-12-19 ENCOUNTER — Other Ambulatory Visit (HOSPITAL_COMMUNITY): Payer: Self-pay

## 2021-12-19 MED ORDER — CYCLOSPORINE 0.05 % OP EMUL
1.0000 [drp] | Freq: Two times a day (BID) | OPHTHALMIC | 3 refills | Status: AC
  Filled 2021-12-19: qty 60, 30d supply, fill #0
  Filled 2022-07-12: qty 180, 90d supply, fill #0

## 2021-12-21 ENCOUNTER — Other Ambulatory Visit (HOSPITAL_COMMUNITY): Payer: Self-pay

## 2021-12-21 MED ORDER — RESTASIS 0.05 % OP EMUL
OPHTHALMIC | 0 refills | Status: AC
  Filled 2021-12-21: qty 60, 30d supply, fill #0
  Filled 2021-12-27: qty 180, 90d supply, fill #0

## 2021-12-27 ENCOUNTER — Other Ambulatory Visit (HOSPITAL_COMMUNITY): Payer: Self-pay

## 2021-12-27 ENCOUNTER — Encounter: Payer: Self-pay | Admitting: Internal Medicine

## 2021-12-28 ENCOUNTER — Other Ambulatory Visit (HOSPITAL_COMMUNITY): Payer: Self-pay

## 2021-12-31 ENCOUNTER — Other Ambulatory Visit (HOSPITAL_COMMUNITY): Payer: Self-pay

## 2022-01-05 ENCOUNTER — Other Ambulatory Visit (HOSPITAL_COMMUNITY): Payer: Self-pay

## 2022-01-07 ENCOUNTER — Other Ambulatory Visit (HOSPITAL_COMMUNITY): Payer: Self-pay

## 2022-01-07 MED FILL — Atenolol Tab 100 MG: ORAL | 90 days supply | Qty: 90 | Fill #1 | Status: AC

## 2022-01-18 ENCOUNTER — Encounter: Payer: Federal, State, Local not specified - PPO | Admitting: Internal Medicine

## 2022-02-04 ENCOUNTER — Other Ambulatory Visit (HOSPITAL_COMMUNITY): Payer: Self-pay

## 2022-02-04 ENCOUNTER — Other Ambulatory Visit: Payer: Self-pay | Admitting: Nurse Practitioner

## 2022-02-04 ENCOUNTER — Other Ambulatory Visit: Payer: Self-pay | Admitting: Internal Medicine

## 2022-02-04 DIAGNOSIS — E782 Mixed hyperlipidemia: Secondary | ICD-10-CM

## 2022-02-04 DIAGNOSIS — E6609 Other obesity due to excess calories: Secondary | ICD-10-CM

## 2022-02-04 MED ORDER — PHENTERMINE HCL 37.5 MG PO TABS
ORAL_TABLET | ORAL | 0 refills | Status: DC
  Filled 2022-02-04: qty 30, 30d supply, fill #0

## 2022-02-06 ENCOUNTER — Encounter: Payer: Self-pay | Admitting: Internal Medicine

## 2022-02-06 ENCOUNTER — Other Ambulatory Visit (HOSPITAL_COMMUNITY): Payer: Self-pay

## 2022-02-06 NOTE — Patient Instructions (Signed)
Due to recent changes in healthcare laws, you Dudash see the results of your imaging and laboratory studies on MyChart before your provider has had a chance to review them.  We understand that in some cases there Gin be results that are confusing or concerning to you. Not all laboratory results come back in the same time frame and the provider Hoback be waiting for multiple results in order to interpret others.  Please give [place] 48 hours in order for your provider to thoroughly review all the results before contacting the office for clarification of your results.   +++++++++++++++++++++++++  Vit D  & Vit C 1,000 mg   are recommended to help protect  against the Covid-19 and other Corona viruses.    Also it's recommended  to take  Zinc 50 mg  to help  protect against the Covid-19   and best place to get  is also on [email or web address]  and don't pay more than 6-8 cents /pill !  ================================ Coronavirus (COVID-19) Are you at risk?  Are you at risk for the Coronavirus (COVID-19)?  To be considered HIGH RISK for Coronavirus (COVID-19), you have to meet the following criteria:  Traveled to [place], [place], [place], [place] or [place]; or in the [place] to [place], [place], [place]  or [place]; and have fever, cough, and shortness of breath within the last 2 weeks of travel OR Been in close contact with a person diagnosed with COVID-19 within the last 2 weeks and have  fever, cough,and shortness of breath  IF YOU DO NOT MEET THESE CRITERIA, YOU ARE CONSIDERED LOW RISK FOR COVID-19.  What to do if you are HIGH RISK for COVID-19?  If you are having a medical emergency, call 911. Seek medical care right away. Before you go to a doctor's office, urgent care or emergency department,  call ahead and tell them about your recent travel, contact with someone diagnosed with COVID-19   and your symptoms.  You should receive instructions from your physician's office regarding next  steps of care.  When you arrive at healthcare provider, tell the healthcare staff immediately you have returned from  visiting [place], [place], [place], [place] or [place]; or traveled in the [place] to [place], [place],  [place] or [place] in the last two weeks or you have been in close contact with a person diagnosed with  COVID-19 in the last 2 weeks.   Tell the health care staff about your symptoms: fever, cough and shortness of breath. After you have been seen by a medical provider, you will be either: Tested for (COVID-19) and discharged home on quarantine except to seek medical care if  symptoms worsen, and asked to  Stay home and avoid contact with others until you get your results (4-5 days)  Avoid travel on public transportation if possible (such as bus, train, or airplane) or Sent to the Emergency Department by EMS for evaluation, COVID-19 testing  and  possible admission depending on your condition and test results.  What to do if you are LOW RISK for COVID-19?  Reduce your risk of any infection by using the same precautions used for avoiding the common cold or flu:  Wash your hands often with soap and warm water for at least 20 seconds.  If soap and water are not readily available,  use an alcohol-based hand sanitizer with at least 60% alcohol.  If coughing or sneezing, cover your mouth and nose by coughing  or sneezing into the elbow areas of your shirt or coat,  into a tissue or into your sleeve (not your hands). Avoid shaking hands with others and consider head nods or verbal greetings only. Avoid touching your eyes, nose, or mouth with unwashed hands.  Avoid close contact with people who are sick. Avoid places or events with large numbers of people in one location, like concerts or sporting events. Carefully consider travel plans you have or are making. If you are planning any travel outside or inside the [place], visit the CDC's Travelers' Health webpage for the  latest health notices. If you have some symptoms but not all symptoms, continue to monitor at home and seek medical attention  if your symptoms worsen. If you are having a medical emergency, call 911.   >>>>>>>>>>>>>>>>>>>>>>>>>>>>>>>>>  We Do NOT Approve of [employer] > > > > > > > > > > > > > > > > > > > > > > > > > > > > > > > > > > > > > > >  Preventive Care for Adults  A healthy lifestyle and preventive care can promote health and wellness. Preventive health guidelines for women include the following key practices. A routine yearly physical is a good way to check with your health care provider about your health and preventive screening. It is a chance to share any concerns and updates on your health and to receive a thorough exam. Visit your dentist for a routine exam and preventive care every 6 months. Brush your teeth twice a day and floss once a day. Good oral hygiene prevents tooth decay and gum disease. The frequency of eye exams is based on your age, health, family medical history, use of contact lenses, and other factors. Follow your health care provider's recommendations for frequency of eye exams. Eat a healthy diet. Foods like vegetables, fruits, whole grains, low-fat dairy products, and lean protein foods contain the nutrients you need without too many calories. Decrease your intake of foods high in solid fats, added sugars, and salt. Eat the right amount of calories for you. Get information about a proper diet from your health care provider, if necessary. Regular physical exercise is one of the most important things you can do for your health. Most adults should get at least 150 minutes of moderate-intensity exercise (any activity that increases your heart rate and causes you to sweat) each week. In addition, most adults need muscle-strengthening exercises on 2 or more days a week. Maintain a healthy weight. The body mass index (BMI) is a screening tool to identify  possible weight problems. It provides an estimate of body fat based on height and weight. Your health care provider can find your BMI and can help you achieve or maintain a healthy weight. For adults 20 years and older: A BMI below 18.5 is considered underweight. A BMI of 18.5 to 24.9 is normal. A BMI of 25 to 29.9 is considered overweight. A BMI of 30 and above is considered obese. Maintain normal blood lipids and cholesterol levels by exercising and minimizing your intake of saturated fat. Eat a balanced diet with plenty of fruit and vegetables. If your lipid or cholesterol levels are high, you are over 50, or you are at high risk for heart disease, you may need your cholesterol levels checked more frequently. Ongoing high lipid and cholesterol levels should be treated with medicines if diet and exercise are not working. If you smoke, find out from  your health care provider how to quit. If you do not use tobacco, do not start. Lung cancer screening is recommended for adults aged 55-80 years who are at high risk for developing lung cancer because of a history of smoking. A yearly low-dose CT scan of the lungs is recommended for people who have at least a 30-pack-year history of smoking and are a current smoker or have quit within the past 15 years. A pack year of smoking is smoking an average of 1 pack of cigarettes a day for 1 year (for example: 1 pack a day for 30 years or 2 packs a day for 15 years). Yearly screening should continue until the smoker has stopped smoking for at least 15 years. Yearly screening should be stopped for people who develop a health problem that would prevent them from having lung cancer treatment. Avoid use of street drugs. Do not share needles with anyone. Ask for help if you need support or instructions about stopping the use of drugs. High blood pressure causes heart disease and increases the risk of stroke.  Ongoing high blood pressure should be treated with medicines if  weight loss and exercise do not work. If you are 55-79 years old, ask your health care provider if you should take aspirin to prevent strokes. Diabetes screening involves taking a blood sample to check your fasting blood sugar level. This should be done once every 3 years, after age 45, if you are within normal weight and without risk factors for diabetes. Testing should be considered at a younger age or be carried out more frequently if you are overweight and have at least 1 risk factor for diabetes. Breast cancer screening is essential preventive care for women. You should practice "breast self-awareness." This means understanding the normal appearance and feel of your breasts and may include breast self-examination. Any changes detected, no matter how small, should be reported to a health care provider. Women in their 20s and 30s should have a clinical breast exam (CBE) by a health care provider as part of a regular health exam every 1 to 3 years. After age 40, women should have a CBE every year. Starting at age 40, women should consider having a mammogram (breast X-ray test) every year. Women who have a family history of breast cancer should talk to their health care provider about genetic screening. Women at a high risk of breast cancer should talk to their health care providers about having an MRI and a mammogram every year. Breast cancer gene (BRCA)-related cancer risk assessment is recommended for women who have family members with BRCA-related cancers. BRCA-related cancers include breast, ovarian, tubal, and peritoneal cancers. Having family members with these cancers may be associated with an increased risk for harmful changes (mutations) in the breast cancer genes BRCA1 and BRCA2. Results of the assessment will determine the need for genetic counseling and BRCA1 and BRCA2 testing. Routine pelvic exams to screen for cancer are no longer recommended for nonpregnant women who are considered low risk for  cancer of the pelvic organs (ovaries, uterus, and vagina) and who do not have symptoms. Ask your health care provider if a screening pelvic exam is right for you. If you have had past treatment for cervical cancer or a condition that could lead to cancer, you need Pap tests and screening for cancer for at least 20 years after your treatment. If Pap tests have been discontinued, your risk factors (such as having a new sexual partner) need to be   reassessed to determine if screening should be resumed. Some women have medical problems that increase the chance of getting cervical cancer. In these cases, your health care provider may recommend more frequent screening and Pap tests.  Colorectal cancer can be detected and often prevented. Most routine colorectal cancer screening begins at the age of 66 years and continues through age 34 years. However, your health care provider may recommend screening at an earlier age if you have risk factors for colon cancer. On a yearly basis, your health care provider may provide home test kits to check for hidden blood in the stool. Use of a small camera at the end of a tube, to directly examine the colon (sigmoidoscopy or colonoscopy), can detect the earliest forms of colorectal cancer. Talk to your health care provider about this at age 31, when routine screening begins.  Direct exam of the colon should be repeated every 5-10 years through age 47 years, unless early forms of pre-cancerous polyps or small growths are found. Osteoporosis is a disease in which the bones lose minerals and strength with aging. This can result in serious bone fractures or breaks. The risk of osteoporosis can be identified using a bone density scan. Women ages 67 years and over and women at risk for fractures or osteoporosis should discuss screening with their health care providers. Ask your health care provider whether you should take a calcium supplement or vitamin D to reduce the rate of  osteoporosis. Menopause can be associated with physical symptoms and risks. Hormone replacement therapy is available to decrease symptoms and risks. You should talk to your health care provider about whether hormone replacement therapy is right for you. Use sunscreen. Apply sunscreen liberally and repeatedly throughout the day. You should seek shade when your shadow is shorter than you. Protect yourself by wearing long sleeves, pants, a wide-brimmed hat, and sunglasses year round, whenever you are outdoors. Once a month, do a whole body skin exam, using a mirror to look at the skin on your back. Tell your health care provider of new moles, moles that have irregular borders, moles that are larger than a pencil eraser, or moles that have changed in shape or color. Stay current with required vaccines (immunizations). Influenza vaccine. All adults should be immunized every year. Tetanus, diphtheria, and acellular pertussis (Td, Tdap) vaccine. Pregnant women should receive 1 dose of Tdap vaccine during each pregnancy. The dose should be obtained regardless of the length of time since the last dose. Immunization is preferred during the 27th-36th week of gestation. An adult who has not previously received Tdap or who does not know her vaccine status should receive 1 dose of Tdap. This initial dose should be followed by tetanus and diphtheria toxoids (Td) booster doses every 10 years. Adults with an unknown or incomplete history of completing a 3-dose immunization series with Td-containing vaccines should begin or complete a primary immunization series including a Tdap dose. Adults should receive a Td booster every 10 years.  Zoster vaccine. One dose is recommended for adults aged 69 years or older unless certain conditions are present.  Pneumococcal 13-valent conjugate (PCV13) vaccine. When indicated, a person who is uncertain of her immunization history and has no record of immunization should receive the PCV13  vaccine. An adult aged 43 years or older who has certain medical conditions and has not been previously immunized should receive 1 dose of PCV13 vaccine. This PCV13 should be followed with a dose of pneumococcal polysaccharide (PPSV23) vaccine. The PPSV23  vaccine dose should be obtained at least 1 or more year(s) after the dose of PCV13 vaccine. An adult aged 48 years or older who has certain medical conditions and previously received 1 or more doses of PPSV23 vaccine should receive 1 dose of PCV13. The PCV13 vaccine dose should be obtained 1 or more years after the last PPSV23 vaccine dose.  Pneumococcal polysaccharide (PPSV23) vaccine. When PCV13 is also indicated, PCV13 should be obtained first. All adults aged 41 years and older should be immunized. An adult younger than age 65 years who has certain medical conditions should be immunized. Any person who resides in a nursing home or long-term care facility should be immunized. An adult smoker should be immunized. People with an immunocompromised condition and certain other conditions should receive both PCV13 and PPSV23 vaccines. People with human immunodeficiency virus (HIV) infection should be immunized as soon as possible after diagnosis. Immunization during chemotherapy or radiation therapy should be avoided. Routine use of PPSV23 vaccine is not recommended for American Indians, [place] Natives, or people younger than 65 years unless there are medical conditions that require PPSV23 vaccine. When indicated, people who have unknown immunization and have no record of immunization should receive PPSV23 vaccine. One-time revaccination 5 years after the first dose of PPSV23 is recommended for people aged 19-64 years who have chronic kidney failure, nephrotic syndrome, asplenia, or immunocompromised conditions. People who received 1-2 doses of PPSV23 before age 28 years should receive another dose of PPSV23 vaccine at age 42 years or later if at least 5 years have  passed since the previous dose. Doses of PPSV23 are not needed for people immunized with PPSV23 at or after age 45 years.  Preventive Services / Frequency  Ages 32 years and over Blood pressure check. Lipid and cholesterol check. Lung cancer screening. / Every year if you are aged 26-80 years and have a 30-pack-year history of smoking and currently smoke or have quit within the past 15 years. Yearly screening is stopped once you have quit smoking for at least 15 years or develop a health problem that would prevent you from having lung cancer treatment. Clinical breast exam.** / Every year after age 74 years.  BRCA-related cancer risk assessment.** / For women who have family members with a BRCA-related cancer (breast, ovarian, tubal, or peritoneal cancers). Mammogram.** / Every year beginning at age 75 years and continuing for as long as you are in good health. Consult with your health care provider. Pap test.** / Every 3 years starting at age 37 years through age 42 or 28 years with 3 consecutive normal Pap tests. Testing can be stopped between 65 and 70 years with 3 consecutive normal Pap tests and no abnormal Pap or HPV tests in the past 10 years. Fecal occult blood test (FOBT) of stool. / Every year beginning at age 36 years and continuing until age 59 years. You may not need to do this test if you get a colonoscopy every 10 years. Flexible sigmoidoscopy or colonoscopy.** / Every 5 years for a flexible sigmoidoscopy or every 10 years for a colonoscopy beginning at age 67 years and continuing until age 67 years. Hepatitis C blood test.** / For all people born from [birth year] through 1964 and any individual with known risks for hepatitis C. Osteoporosis screening.** / A one-time screening for women ages 54 years and over and women at risk for fractures or osteoporosis. Skin self-exam. / Monthly. Influenza vaccine. / Every year. Tetanus, diphtheria, and acellular pertussis (Tdap/Td) vaccine.** /  1 dose  of Td every 10 years. Zoster vaccine.** / 1 dose for adults aged 60 years or older. Pneumococcal 13-valent conjugate (PCV13) vaccine.** / Consult your health care provider. Pneumococcal polysaccharide (PPSV23) vaccine.** / 1 dose for all adults aged 65 years and older. Screening for abdominal aortic aneurysm (AAA)  by ultrasound is recommended for people who have history of high blood pressure or who are current or former smokers. ++++++++++++++++++++ Recommend Adult Low Dose Aspirin or  coated  Aspirin 81 mg daily  To reduce risk of Colon Cancer 40 %,  Skin Cancer 26 % ,  Melanoma 46%  and  Pancreatic cancer 60% ++++++++++++++++++++ Vitamin D goal  is between 70-100.  Please make sure that you are taking your Vitamin D as directed.  It is very important as a natural anti-inflammatory  helping hair, skin, and nails, as well as reducing stroke and heart attack risk.  It helps your bones and helps with mood. It also decreases numerous cancer risks so please take it as directed.  Low Vit D is associated with a 200-300% higher risk for CANCER  and 200-300% higher risk for HEART   ATTACK  &  STROKE.   ...................................... It is also associated with higher death rate at younger ages,  autoimmune diseases like Rheumatoid arthritis, Lupus, Multiple Sclerosis.    Also many other serious conditions, like depression, Alzheimer's Dementia, infertility, muscle aches, fatigue, fibromyalgia - just to name a few. ++++++++++++++++++ Recommend the book "The END of DIETING" by Dr Joel Fuhrman  & the book "The END of DIABETES " by Dr Joel Fuhrman At [employer] - get book & Audio CD's    Being diabetic has a  300% increased risk for heart attack, stroke, cancer, and alzheimer- type vascular dementia. It is very important that you work harder with diet by avoiding all foods that are white. Avoid white rice (brown & wild rice is OK), white potatoes (sweetpotatoes in moderation is OK),  White bread or wheat bread or anything made out of white flour like bagels, donuts, rolls, buns, biscuits, cakes, pastries, cookies, pizza crust, and pasta (made from white flour & egg whites) - vegetarian pasta or spinach or wheat pasta is OK. Multigrain breads like Arnold's or [employer], or multigrain sandwich thins or flatbreads.  Diet, exercise and weight loss can reverse and cure diabetes in the early stages.  Diet, exercise and weight loss is very important in the control and prevention of complications of diabetes which affects every system in your body, ie. Brain - dementia/stroke, eyes - glaucoma/blindness, heart - heart attack/heart failure, kidneys - dialysis, stomach - gastric paralysis, intestines - malabsorption, nerves - severe painful neuritis, circulation - gangrene & loss of a leg(s), and finally cancer and Alzheimers.    I recommend avoid fried & greasy foods,  sweets/candy, white rice (brown or wild rice or Quinoa is OK), white potatoes (sweet potatoes are OK) - anything made from white flour - bagels, doughnuts, rolls, buns, biscuits,white and wheat breads, pizza crust and traditional pasta made of white flour & egg white(vegetarian pasta or spinach or wheat pasta is OK).  Multi-grain bread is OK - like multi-grain flat bread or sandwich thins. Avoid alcohol in excess. Exercise is also important.    Eat all the vegetables you want - avoid meat, especially red meat and dairy - especially cheese.  Cheese is the most concentrated form of trans-fats which is the worst thing to clog up our arteries. Veggie cheese is OK   which can be found in the fresh produce section at [facility] or [employer] or [employer]  +++++++++++++++++++ DASH Eating Plan  DASH stands for "Dietary Approaches to Stop Hypertension."   The DASH eating plan is a healthy eating plan that has been shown to reduce high blood pressure (hypertension). Additional health benefits may include reducing the risk of type 2  diabetes mellitus, heart disease, and stroke. The DASH eating plan may also help with weight loss. WHAT DO I NEED TO KNOW ABOUT THE DASH EATING PLAN? For the DASH eating plan, you will follow these general guidelines: Choose foods with a percent daily value for sodium of less than 5% (as listed on the food label). Use salt-free seasonings or herbs instead of table salt or sea salt. Check with your health care provider or pharmacist before using salt substitutes. Eat lower-sodium products, often labeled as "lower sodium" or "no salt added." Eat fresh foods. Eat more vegetables, fruits, and low-fat dairy products. Choose whole grains. Look for the word "whole" as the first word in the ingredient list. Choose fish  Limit sweets, desserts, sugars, and sugary drinks. Choose heart-healthy fats. Eat veggie cheese  Eat more home-cooked food and less restaurant, buffet, and fast food. Limit fried foods. Cook foods using methods other than frying. Limit canned vegetables. If you do use them, rinse them well to decrease the sodium. When eating at a restaurant, ask that your food be prepared with less salt, or no salt if possible.                      WHAT FOODS CAN I EAT? Read Dr Fara Olden Fuhrman's books on The End of Dieting & The End of Diabetes  Grains Whole grain or whole wheat bread. Brown rice. Whole grain or whole wheat pasta. Quinoa, bulgur, and whole grain cereals. Low-sodium cereals. Corn or whole wheat flour tortillas. Whole grain cornbread. Whole grain crackers. Low-sodium crackers.  Vegetables Fresh or frozen vegetables (raw, steamed, roasted, or grilled). Low-sodium or reduced-sodium tomato and vegetable juices. Low-sodium or reduced-sodium tomato sauce and paste. Low-sodium or reduced-sodium canned vegetables.   Fruits All fresh, canned (in natural juice), or frozen fruits.  Protein Products  All fish and seafood.  Dried beans, peas, or lentils. Unsalted nuts and seeds. Unsalted  canned beans.  Dairy Low-fat dairy products, such as skim or 1% milk, 2% or reduced-fat cheeses, low-fat ricotta or cottage cheese, or plain low-fat yogurt. Low-sodium or reduced-sodium cheeses.  Fats and Oils Tub margarines without trans fats. Light or reduced-fat mayonnaise and salad dressings (reduced sodium). Avocado. Safflower, olive, or canola oils. Natural peanut or almond butter.  Other Unsalted popcorn and pretzels. The items listed above may not be a complete list of recommended foods or beverages. Contact your dietitian for more options.  +++++++++++++++  WHAT FOODS ARE NOT RECOMMENDED? Grains/ White flour or wheat flour White bread. White pasta. White rice. Refined cornbread. Bagels and croissants. Crackers that contain trans fat.  Vegetables  Creamed or fried vegetables. Vegetables in a . Regular canned vegetables. Regular canned tomato sauce and paste. Regular tomato and vegetable juices.  Fruits Dried fruits. Canned fruit in light or heavy syrup. Fruit juice.  Meat and Other Protein Products Meat in general - RED meat & White meat.  Fatty cuts of meat. Ribs, chicken wings, all processed meats as bacon, sausage, bologna, salami, fatback, hot dogs, bratwurst and packaged luncheon meats.  Dairy Whole or 2% milk, cream, half-and-half, and cream cheese.  Whole-fat or sweetened yogurt. Full-fat cheeses or blue cheese. Non-dairy creamers and whipped toppings. Processed cheese, cheese spreads, or cheese curds.  Condiments Onion and garlic salt, seasoned salt, table salt, and sea salt. Canned and packaged gravies. Worcestershire sauce. Tartar sauce. Barbecue sauce. Teriyaki sauce. Soy sauce, including reduced sodium. Steak sauce. Fish sauce. Oyster sauce. Cocktail sauce. Horseradish. Ketchup and mustard. Meat flavorings and tenderizers. Bouillon cubes. Hot sauce. Tabasco sauce. Marinades. Taco seasonings. Relishes.  Fats and Oils Butter, stick margarine, lard, shortening and  bacon fat. Coconut, palm kernel, or palm oils. Regular salad dressings.  Pickles and olives. Salted popcorn and pretzels.  The items listed above may not be a complete list of foods and beverages to avoid.   

## 2022-02-06 NOTE — Progress Notes (Unsigned)
Annual Screening/Preventative Visit & Comprehensive Evaluation &  Examination  Future Appointments  Date Time Provider Department  02/07/2022               cpe  2:00 PM Unk Pinto, MD [facility]  08/09/2022              wellness 11:00 AM Wallace Going  02/12/2023               cpe  2:00 PM Unk Pinto, MD [facility]          This very nice 66 y.o. DBF presents for a Screening /Preventative Visit & comprehensive evaluation and management of multiple medical co-morbidities.  Patient has been followed for HTN, HLD, Prediabetes  and Vitamin D Deficiency.    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 Last Eval at [facility] discounted that Dx with a Dx/o anomalous Optic Discs and NOT PAPILLEDEMA.]]]        HTN predates circa 1992.  Patient's BP has been controlled at home and patient denies any cardiac symptoms as chest pain, palpitations, shortness of breath, dizziness or ankle swelling. Today's BP is at goal - 130/79.        Patient's hyperlipidemia is not controlled with diet and Rosuvastatin. Patient denies myalgias or other medication SE's. Last lipids were not at goal ( ? off meds):  Lab Results  Component Value Date   CHOL 214 (H) 10/10/2020   HDL 71 10/10/2020   LDLCALC 125 (H) 10/10/2020   TRIG 83 10/10/2020   CHOLHDL 3.0 10/10/2020        Patient has hx/o prediabetes (A1c 5.8% /2011) and patient denies reactive hypoglycemic symptoms, visual blurring, diabetic polys or paresthesias. 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 She has been on supressive therapy since.        Finally, patient has history of Vitamin D Deficiency and last Vitamin  D was at goal:  Lab Results  Component Value Date   VD25OH 61 06/05/2020     Current Outpatient Medications on File Prior to Visit  Medication Sig   aspirin 81 MG tablet Take  daily.   atenolol (TENORMIN) 100 MG tablet TAKE 1 TABLET DAILY   buPROPion-XL  150 MG  TAKE 1 TABLET  DAILY   VITAMIN D 2000 units CAPS Takes 5,000 Units daily   CINNAMON 1,000 mg  Take  2 times daily.    estradiol (VIVELLE-DOT) 0.025 MG/24HR APPLY 1 PATCH TWICE A WEEK   ezetimibe  10 MG tablet Take 1 tablet daily for cholesterol   fexofenadine 180 MG tablet Take daily.    fFLONASE  nasal spray PLACE 2 SPRAYS INTO BOTH NOSTRILS DAILY.   furosemide (LASIX) 40 MG tablet TAKE 1 TABLET DAILY    levothyroxine 112 MCG tablet Take 1 tablet daily    Magnesium 500 MG CAPS Take daily.   MINIVELLE 0.025 MG/24HR    HAIR/SKIN/NAILS  Take 3 tablets  daily   olmesartan 40 MG tablet TAKE 1/2 TO 1 TABLET DAILY    Omega-3 FISH OIL 1000 MG CAPS Take daily.   phentermine 37.5 MG tablet TAKE 1/2 TO 1 TABLET DAILY    rosuvastatin 40 MG tablet TAKE 1/2 TO 1 TABLET DAILY    vitamin E 400 UNIT Take daily.   Zinc 25 MG TABS Take by mouth daily.     Allergies  Allergen Reactions   Erythromycin Nausea And Vomiting and Other (See Comments)    Diarrhea - more severe    Past Medical History:  Diagnosis Date   Anxiety disorder    Cancer ([facility])    thyroid   GERD (gastroesophageal reflux disease)    History of thyroid cancer    Hypertension    Migraine headache    Pseudopapilledema of both optic discs    bilateral   Vitamin D deficiency      Health Maintenance  Topic Date Due   Pneumococcal Vaccine 33-58 Years old (1 - PCV) Never done   Zoster Vaccines- Shingrix (1 of 2) Never done   PAP SMEAR-Modifier  04/18/2018   MAMMOGRAM  06/02/2019   COVID-19 Vaccine (3 - Pfizer risk series) 08/20/2019   INFLUENZA VACCINE  02/12/2021   TETANUS/TDAP  05/02/2025   COLONOSCOPY (Pts 45-4yr Insurance coverage will need to be  confirmed)  08/12/2027   Hepatitis C Screening  Completed   HIV Screening  Completed   HPV VACCINES  Aged Out     Immunization History  Administered Date(s) Administered   DTaP 12/14/1994   Hepatitis B 07/15/1989   Influenza-Unspecified 04/29/2017   PFIZER(Purple Top)SARS-COV-2 Vaccination 07/05/2019, 07/23/2019   PPD Test 07/18/2014, 08/04/2015, 10/05/2018, 11/22/2019   Pneumococcal Polysaccharide-23 10/09/1998   Tdap 05/03/2015    Last Colon - 08/11/2017 - Dr MCollene Mares- recommended 10 yr f/u due Feb 2029   Last MGM  & Pap Jan 2021 - Dr KRaliegh Ip Richardson's office   Past Surgical History:  Procedure Laterality Date   ABDOMINAL HYSTERECTOMY  2005   THYROIDECTOMY  04/25/11    Family History  Problem Relation Age of Onset   Diabetes Mother    Heart disease Mother    Hypertension Mother    Hyperlipidemia Mother    COPD Mother    Depression Father    Suicidality Father    Stroke Brother    Heart disease Brother    Asthma Son    Cancer Maternal Aunt        lung    Social History   Tobacco Use   Smoking status: Light Smoker    Packs/day: 0.20    Pack years: 0.00    Types: Cigarettes   Smokeless tobacco: Never   Tobacco comments:    1-2 cigarettes per day  Substance Use Topics   Alcohol use: Yes    Comment: weekends   Drug use: No      ROS Constitutional: Denies fever, chills, weight loss/gain, headaches, insomnia,  night sweats, and change in appetite. Does c/o fatigue. Eyes: Denies redness, blurred vision, diplopia, discharge, itchy, watery eyes.  ENT: Denies discharge, congestion, post nasal drip, epistaxis, sore throat, earache, hearing loss, dental pain, Tinnitus, Vertigo, Sinus pain, snoring.  Cardio: Denies chest pain, palpitations, irregular heartbeat, syncope, dyspnea, diaphoresis, orthopnea, PND, claudication, edema Respiratory: denies cough, dyspnea, DOE, pleurisy, hoarseness, laryngitis, wheezing.  Gastrointestinal: Denies dysphagia, heartburn, reflux,  water brash, pain, cramps, nausea, vomiting, bloating, diarrhea, constipation, hematemesis, melena, hematochezia, jaundice, hemorrhoids Genitourinary: Denies dysuria, frequency,  urgency, nocturia, hesitancy, discharge, hematuria, flank pain Breast: Breast lumps, nipple discharge, bleeding.  Musculoskeletal: Denies arthralgia, myalgia, stiffness, Jt. Swelling, pain, limp, and strain/sprain. Denies falls. Skin: Denies puritis, rash, hives, warts, acne, eczema, changing in skin lesion Neuro: No weakness, tremor, incoordination, spasms, paresthesia, pain Psychiatric: Denies confusion, memory loss, sensory loss. Denies Depression. Endocrine: Denies change in weight, skin, hair change, nocturia, and paresthesia, diabetic polys, visual blurring, hyper / hypo glycemic episodes.  Heme/Lymph: No excessive bleeding, bruising, enlarged lymph nodes.  Physical Exam  BP 130/79   Pulse 75   Temp (!) 96.9 F (36.1 C)   Ht 5' 2.5" (1.588 m)   Wt 183 lb 6.4 oz (83.2 kg)   SpO2 99%   BMI 33.01 kg/m   General Appearance: Well nourished, well groomed and in no apparent distress.  Eyes: PERRLA, EOMs, conjunctiva no swelling or erythema, normal fundi and vessels. Sinuses: No frontal/maxillary tenderness ENT/Mouth: EACs patent / TMs  nl. Nares clear without erythema, swelling, mucoid exudates. Oral hygiene is good. No erythema, swelling, or exudate. Tongue normal, non-obstructing. Tonsils not swollen or erythematous. Hearing normal.  Neck: Supple, thyroid not palpable. No bruits, nodes or JVD. Respiratory: Respiratory effort normal.  BS equal and clear bilateral without rales, rhonci, wheezing or stridor. Cardio: Heart sounds are normal with regular rate and rhythm and no murmurs, rubs or gallops. Peripheral pulses are normal and equal bilaterally without edema. No aortic or femoral bruits. Chest: symmetric with normal excursions and percussion. Breasts: Symmetric, without lumps, nipple discharge, retractions,  or fibrocystic changes.  Abdomen: Flat, soft with bowel sounds active. Nontender, no guarding, rebound, hernias, masses, or organomegaly.  Lymphatics: Non tender without lymphadenopathy.  Musculoskeletal: Full ROM all peripheral extremities, joint stability, 5/5 strength, and normal gait. Skin: Warm and dry without rashes, lesions, cyanosis, clubbing or  ecchymosis.  Neuro: Cranial nerves intact, reflexes equal bilaterally. Normal muscle tone, no cerebellar symptoms. Sensation intact.  Pysch: Alert and oriented X 3, normal affect, Insight and Judgment appropriate.    Assessment and Plan  1. Annual Preventative Screening Examination   2. Essential hypertension  - EKG 12-Lead - CBC with Differential/Platelet - COMPLETE METABOLIC PANEL WITH GFR - Magnesium - TSH  3. Hyperlipidemia, mixed  - EKG 12-Lead - Lipid panel  4. Abnormal glucose  - EKG 12-Lead - Hemoglobin A1c - Insulin, random  5. Vitamin D deficiency  - VITAMIN D 25 Hydroxy   6. Hypothyroidism, unspecified type  - TSH  7. Screening for colorectal cancer  - POC Hemoccult Bld/Stl 8. FHx: heart disease  - EKG 12-Lead  9. Smoker (light, 1-2 daily, unmotivated to quit)  - EKG 12-Lead  10. Medication management  - Urinalysis, Routine w reflex microscopic - Microalbumin / creatinine urine ratio - COMPLETE METABOLIC PANEL WITH GFR - Magnesium - Lipid panel - TSH - Hemoglobin A1c - Insulin, random - VITAMIN D 25 Hydroxy           Patient was counseled in prudent diet to achieve/maintain BMI less than 25 for weight control, BP monitoring, regular exercise and medications. Discussed med's effects and SE's. Screening labs and tests as requested with regular follow-up as recommended. Over 40 minutes of exam, counseling, chart review and high complex critical decision making was performed.   Kirtland Bouchard, MD

## 2022-02-07 ENCOUNTER — Encounter: Payer: Self-pay | Admitting: Internal Medicine

## 2022-02-07 ENCOUNTER — Ambulatory Visit (INDEPENDENT_AMBULATORY_CARE_PROVIDER_SITE_OTHER): Payer: Federal, State, Local not specified - PPO | Admitting: Internal Medicine

## 2022-02-07 VITALS — BP 130/79 | HR 75 | Temp 96.9°F | Ht 62.5 in | Wt 183.4 lb

## 2022-02-07 DIAGNOSIS — Z79899 Other long term (current) drug therapy: Secondary | ICD-10-CM | POA: Diagnosis not present

## 2022-02-07 DIAGNOSIS — Z136 Encounter for screening for cardiovascular disorders: Secondary | ICD-10-CM | POA: Diagnosis not present

## 2022-02-07 DIAGNOSIS — Z131 Encounter for screening for diabetes mellitus: Secondary | ICD-10-CM

## 2022-02-07 DIAGNOSIS — Z0001 Encounter for general adult medical examination with abnormal findings: Secondary | ICD-10-CM

## 2022-02-07 DIAGNOSIS — Z0000 Encounter for general adult medical examination without abnormal findings: Secondary | ICD-10-CM | POA: Diagnosis not present

## 2022-02-07 DIAGNOSIS — E039 Hypothyroidism, unspecified: Secondary | ICD-10-CM

## 2022-02-07 DIAGNOSIS — Z1211 Encounter for screening for malignant neoplasm of colon: Secondary | ICD-10-CM

## 2022-02-07 DIAGNOSIS — E782 Mixed hyperlipidemia: Secondary | ICD-10-CM

## 2022-02-07 DIAGNOSIS — E559 Vitamin D deficiency, unspecified: Secondary | ICD-10-CM | POA: Diagnosis not present

## 2022-02-07 DIAGNOSIS — Z8249 Family history of ischemic heart disease and other diseases of the circulatory system: Secondary | ICD-10-CM

## 2022-02-07 DIAGNOSIS — Z13220 Encounter for screening for lipoid disorders: Secondary | ICD-10-CM | POA: Diagnosis not present

## 2022-02-07 DIAGNOSIS — F17200 Nicotine dependence, unspecified, uncomplicated: Secondary | ICD-10-CM

## 2022-02-07 DIAGNOSIS — R7309 Other abnormal glucose: Secondary | ICD-10-CM

## 2022-02-07 DIAGNOSIS — I10 Essential (primary) hypertension: Secondary | ICD-10-CM

## 2022-02-07 DIAGNOSIS — Z1389 Encounter for screening for other disorder: Secondary | ICD-10-CM

## 2022-02-08 LAB — COMPLETE METABOLIC PANEL WITH GFR
AG Ratio: 1.6 (calc) (ref 1.0–2.5)
ALT: 30 U/L — ABNORMAL HIGH (ref 6–29)
AST: 22 U/L (ref 10–35)
Albumin: 4.3 g/dL (ref 3.6–5.1)
Alkaline phosphatase (APISO): 85 U/L (ref 37–153)
BUN: 16 mg/dL (ref 7–25)
CO2: 32 mmol/L (ref 20–32)
Calcium: 9.5 mg/dL (ref 8.6–10.4)
Chloride: 101 mmol/L (ref 98–110)
Creat: 0.94 mg/dL (ref 0.50–1.05)
Globulin: 2.7 g/dL (calc) (ref 1.9–3.7)
Glucose, Bld: 102 mg/dL — ABNORMAL HIGH (ref 65–99)
Potassium: 4.1 mmol/L (ref 3.5–5.3)
Sodium: 141 mmol/L (ref 135–146)
Total Bilirubin: 0.4 mg/dL (ref 0.2–1.2)
Total Protein: 7 g/dL (ref 6.1–8.1)
eGFR: 67 mL/min/{1.73_m2} (ref >=60)

## 2022-02-08 LAB — LIPID PANEL
Cholesterol: 140 mg/dL (ref <200)
HDL: 72 mg/dL (ref >=50)
LDL Cholesterol (Calc): 53 mg/dL (calc)
Non-HDL Cholesterol (Calc): 68 mg/dL (calc) (ref <130)
Total CHOL/HDL Ratio: 1.9 (calc) (ref <5.0)
Triglycerides: 73 mg/dL (ref <150)

## 2022-02-08 LAB — TSH: TSH: 1.5 mIU/L (ref 0.40–4.50)

## 2022-02-08 LAB — MAGNESIUM: Magnesium: 2.3 mg/dL (ref 1.5–2.5)

## 2022-02-08 LAB — CBC WITH DIFFERENTIAL/PLATELET
Absolute Monocytes: 311 cells/uL (ref 200–950)
Basophils Absolute: 41 cells/uL (ref 0–200)
Basophils Relative: 0.8 %
Eosinophils Absolute: 82 cells/uL (ref 15–500)
Eosinophils Relative: 1.6 %
HCT: 40.6 % (ref 35.0–45.0)
Hemoglobin: 13.4 g/dL (ref 11.7–15.5)
Lymphs Abs: 1418 cells/uL (ref 850–3900)
MCH: 31.5 pg (ref 27.0–33.0)
MCHC: 33 g/dL (ref 32.0–36.0)
MCV: 95.5 fL (ref 80.0–100.0)
MPV: 11.9 fL (ref 7.5–12.5)
Monocytes Relative: 6.1 %
Neutro Abs: 3249 cells/uL (ref 1500–7800)
Neutrophils Relative %: 63.7 %
Platelets: 271 10*3/uL (ref 140–400)
RBC: 4.25 10*6/uL (ref 3.80–5.10)
RDW: 12.6 % (ref 11.0–15.0)
Total Lymphocyte: 27.8 %
WBC: 5.1 10*3/uL (ref 3.8–10.8)

## 2022-02-08 LAB — URINALYSIS, ROUTINE W REFLEX MICROSCOPIC
Bilirubin Urine: NEGATIVE
Glucose, UA: NEGATIVE
Hgb urine dipstick: NEGATIVE
Ketones, ur: NEGATIVE
Leukocytes,Ua: NEGATIVE
Nitrite: NEGATIVE
Protein, ur: NEGATIVE
Specific Gravity, Urine: 1.02 (ref 1.001–1.035)
pH: 6.5 (ref 5.0–8.0)

## 2022-02-08 LAB — VITAMIN D 25 HYDROXY (VIT D DEFICIENCY, FRACTURES): Vit D, 25-Hydroxy: 105 ng/mL — ABNORMAL HIGH (ref 30–100)

## 2022-02-08 LAB — HEMOGLOBIN A1C
Hgb A1c MFr Bld: 5.6 % of total Hgb (ref <5.7)
Mean Plasma Glucose: 114 mg/dL
eAG (mmol/L): 6.3 mmol/L

## 2022-02-08 LAB — MICROALBUMIN / CREATININE URINE RATIO
Creatinine, Urine: 88 mg/dL (ref 20–275)
Microalb Creat Ratio: 2 mcg/mg creat (ref <30)
Microalb, Ur: 0.2 mg/dL

## 2022-02-08 LAB — INSULIN, RANDOM: Insulin: 44 u[IU]/mL — ABNORMAL HIGH

## 2022-02-08 NOTE — Progress Notes (Signed)
<><><><><><><><><><><><><><><><><><><><><><><><><><><><><><><><><> <><><><><><><><><><><><><><><><><><><><><><><><><><><><><><><><><> -   Test results slightly outside the reference range are not unusual. If there is anything important, I will review this with you,  otherwise it is considered normal test values.  If you have further questions,  please do not hesitate to contact me at the office or via My Chart.  <><><><><><><><><><><><><><><><><><><><><><><><><><><><><><><><><> <><><><><><><><><><><><><><><><><><><><><><><><><><><><><><><><><>  -  Vitamin D = 105  - Great  ( Level  is OK up to 115)  <><><><><><><><><><><><><><><><><><><><><><><><><><><><><><><><><> <><><><><><><><><><><><><><><><><><><><><><><><><><><><><><><><><>  -  Total Chol = 140   & LDL 53   - Both  Excellent   - Very low risk for Heart Attack  / Stroke <><><><><><><><><><><><><><><><><><><><><><><><><><><><><><><><><> <><><><><><><><><><><><><><><><><><><><><><><><><><><><><><><><><>  -  A1c - Normal - No Diabetes  - Great  ! <><><><><><><><><><><><><><><><><><><><><><><><><><><><><><><><><> <><><><><><><><><><><><><><><><><><><><><><><><><><><><><><><><><>  -  All Else - CBC - Kidneys - Electrolytes - Liver - Magnesium & Thyroid    - all  Normal / OK <><><><><><><><><><><><><><><><><><><><><><><><><><><><><><><><><> <><><><><><><><><><><><><><><><><><><><><><><><><><><><><><><><><>  -  Keep up the [place] Work  !  <><><><><><><><><><><><><><><><><><><><><><><><><><><><><><><><><> <><><><><><><><><><><><><><><><><><><><><><><><><><><><><><><><><>

## 2022-03-11 ENCOUNTER — Other Ambulatory Visit (HOSPITAL_COMMUNITY): Payer: Self-pay

## 2022-03-11 MED FILL — Bupropion HCl Tab ER 24HR 150 MG: ORAL | 90 days supply | Qty: 90 | Fill #2 | Status: AC

## 2022-03-13 ENCOUNTER — Other Ambulatory Visit (HOSPITAL_COMMUNITY): Payer: Self-pay

## 2022-04-01 ENCOUNTER — Other Ambulatory Visit (HOSPITAL_COMMUNITY): Payer: Self-pay

## 2022-04-01 ENCOUNTER — Other Ambulatory Visit: Payer: Self-pay | Admitting: Nurse Practitioner

## 2022-04-01 DIAGNOSIS — E6609 Other obesity due to excess calories: Secondary | ICD-10-CM

## 2022-04-01 MED ORDER — PHENTERMINE HCL 37.5 MG PO TABS
ORAL_TABLET | ORAL | 0 refills | Status: DC
  Filled 2022-04-01: qty 30, 30d supply, fill #0

## 2022-04-02 ENCOUNTER — Other Ambulatory Visit (HOSPITAL_COMMUNITY): Payer: Self-pay

## 2022-04-03 ENCOUNTER — Other Ambulatory Visit (HOSPITAL_COMMUNITY): Payer: Self-pay

## 2022-04-04 ENCOUNTER — Other Ambulatory Visit (HOSPITAL_COMMUNITY): Payer: Self-pay

## 2022-04-26 ENCOUNTER — Other Ambulatory Visit (HOSPITAL_COMMUNITY): Payer: Self-pay

## 2022-04-26 MED FILL — Atenolol Tab 100 MG: ORAL | 90 days supply | Qty: 90 | Fill #2 | Status: AC

## 2022-05-13 ENCOUNTER — Ambulatory Visit: Payer: Federal, State, Local not specified - PPO | Admitting: Internal Medicine

## 2022-05-13 ENCOUNTER — Encounter: Payer: Self-pay | Admitting: Internal Medicine

## 2022-05-13 VITALS — BP 128/78 | HR 81 | Temp 97.6°F | Resp 17 | Ht 62.5 in | Wt 183.0 lb

## 2022-05-13 DIAGNOSIS — R7309 Other abnormal glucose: Secondary | ICD-10-CM

## 2022-05-13 DIAGNOSIS — Z79899 Other long term (current) drug therapy: Secondary | ICD-10-CM | POA: Diagnosis not present

## 2022-05-13 DIAGNOSIS — E559 Vitamin D deficiency, unspecified: Secondary | ICD-10-CM

## 2022-05-13 DIAGNOSIS — E782 Mixed hyperlipidemia: Secondary | ICD-10-CM | POA: Diagnosis not present

## 2022-05-13 DIAGNOSIS — E039 Hypothyroidism, unspecified: Secondary | ICD-10-CM

## 2022-05-13 DIAGNOSIS — I10 Essential (primary) hypertension: Secondary | ICD-10-CM

## 2022-05-13 MED ORDER — ROSUVASTATIN CALCIUM 40 MG PO TABS: ORAL_TABLET | ORAL | 1 refills | Status: AC

## 2022-05-13 MED ORDER — VITAMIN D 125 MCG (5000 UT) PO CAPS: ORAL_CAPSULE | ORAL | Status: AC

## 2022-05-13 NOTE — Progress Notes (Signed)
Future Appointments  Date Time Provider Department  05/13/2022                  3 mo ov  3:30 PM Unk Pinto, MD [facility]  08/09/2022 11:00 AM Liane Comber, NP [facility]  02/12/2023                     cpe  2:00 PM Unk Pinto, MD [facility]    History of Present Illness:       This very nice 66 y.o.  reMBF  presents for 3 month follow up with HTN, HLD, Pre-Diabetes, Hypothyroidism  and Vitamin D Deficiency.   Diarrhea      Patient is treated for HTN  (1992)  & BP has been controlled at home. Today's BP is at goal -  128/78. Patient has had no complaints of any cardiac type chest pain, palpitations, dyspnea Vertell Limber /PND, dizziness, claudication, or dependent edema.        Hyperlipidemia is controlled with diet & Rosuvastatin . Patient denies myalgias or other med SE's. Last Lipids were at goal :  Lab Results  Component Value Date   CHOL 140 02/07/2022   HDL 72 02/07/2022   LDLCALC 53 02/07/2022   TRIG 73 02/07/2022   CHOLHDL 1.9 02/07/2022     Also, the patient has history of prediabetes (A1c 5.8% /2011) and has had no symptoms of reactive hypoglycemia, diabetic polys, paresthesias or visual blurring.  Last A1c was at goal :  Lab Results  Component Value Date   HGBA1C 5.6 02/07/2022        Further, the patient also has history of Vitamin D Deficiency and supplements vitamin D . Last vitamin D was borderline elevated :  Lab Results  Component Value Date   VD25OH 105 (H) 02/07/2022     Current Outpatient Medications on File Prior to Visit  Medication Sig   aspirin 81 MG tablet Take daily.   atenolol 100 MG tablet Take 1 tablet daily    buPROPion  XL 150 MG  TAKE 1 TABLET  DAILY    VITAMIN D 5000 units  take  5,000 Units daily   RESTASIS 0.05 % ophth emulsion Instill 1 drop in both eyes twice a day   estradiol 0.025 MG/24HR APPLY 1 PATCH TWICE A WEEK   fexofenadine 180 MG tablet Take daily   FLONASE  nasal spray 2 SPRAYS INTO BOTH NOSTRILS  DAILY.   furosemide  40 MG tablet TAKE 1 TABLET  DAILY    levothyroxine  112 MCG tablet Take 1 tablet daily    Magnesium 500 MG CAPS Take daily.   olmesartan  40 MG tablet Take 1 tablet daily for blood pressure.   Omega-3  FISH OIL 1000 MG CAPS Take daily.   phentermine 37.5 MG tablet Take  1/2 to 1 tablet  every morning    rosuvastatin  40 MG tablet TAKE 1/2 TABLET  2 x/week   vitamin C  500 MG tablet Take daily.   vitamin E 400 UNIT  Take daily.   Zinc 25 MG TABS Take  daily.      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 Eyes: Denies redness, blurred vision, diplopia, discharge, itchy, watery eyes.  ENT: Denies discharge, congestion, post nasal drip, epistaxis, sore throat, earache, hearing loss, dental pain, tinnitus, vertigo, sinus pain, snoring.  CV: Denies chest pain, palpitations, irregular heartbeat, syncope, dyspnea, diaphoresis, orthopnea, PND, claudication or edema. Respiratory: denies cough, dyspnea,  DOE, pleurisy, hoarseness, laryngitis, wheezing.  Gastrointestinal: Denies dysphagia, odynophagia, heartburn, reflux, water brash, abdominal pain or cramps, nausea, vomiting, bloating, diarrhea, constipation, hematemesis, melena, hematochezia  or hemorrhoids. Genitourinary: Denies dysuria, frequency, urgency, nocturia, hesitancy, discharge, hematuria or flank pain. Musculoskeletal: Denies arthralgias, myalgias, stiffness, jt. swelling, pain, limping or strain/sprain.  Skin: Denies pruritus, rash, hives, warts, acne, eczema or change in skin lesion(s). Neuro: No weakness, tremor, incoordination, spasms, paresthesia or pain. Psychiatric: Denies confusion, memory loss or sensory loss. Endo: Denies change in weight, skin or hair change.  Heme/Lymph: No excessive bleeding, bruising or enlarged lymph nodes.  Physical Exam  BP 128/78   Pulse 81   Temp 97.6 F (36.4 C)   Resp 17   Ht 5' 2.5" (1.588 m)   Wt 183 lb (83 kg)   SpO2 99%   BMI 32.94 kg/m   Appears  well nourished, well groomed  and in no distress.  Eyes: PERRLA, EOMs, conjunctiva no swelling or erythema. Sinuses: No frontal/maxillary tenderness ENT/Mouth: EAC's clear, TM's nl w/o erythema, bulging. Nares clear w/o erythema, swelling, exudates. Oropharynx clear without erythema or exudates. Oral hygiene is good. Tongue normal, non obstructing. Hearing intact.  Neck: Supple. Thyroid not palpable. Car 2+/2+ without bruits, nodes or JVD. Chest: Respirations nl with BS clear & equal w/o rales, rhonchi, wheezing or stridor.  Cor: Heart sounds normal w/ regular rate and rhythm without sig. murmurs, gallops, clicks or rubs. Peripheral pulses normal and equal  without edema.  Abdomen: Soft & bowel sounds normal. Non-tender w/o guarding, rebound, hernias, masses or organomegaly.  Lymphatics: Unremarkable.  Musculoskeletal: Full ROM all peripheral extremities, joint stability, 5/5 strength and normal gait.  Skin: Warm, dry without exposed  rashes, lesions or ecchymosis apparent.  Neuro: Cranial nerves intact, reflexes equal bilaterally. Sensory-motor testing grossly intact. Tendon reflexes grossly intact.  Pysch: Alert & oriented x 3.  Insight and judgement nl & appropriate. No ideations.  Assessment and Plan:  1. Essential hypertension  - Continue medication, monitor blood pressure at home.  - Continue DASH diet.  Reminder to go to the ER if any CP,  SOB, nausea, dizziness, severe HA, changes vision/speech.   - CBC with Differential/Platelet - COMPLETE METABOLIC PANEL WITH GFR - Magnesium - TSH  2. Hyperlipidemia, mixed  - Continue diet/meds, exercise,& lifestyle modifications.  - Continue monitor periodic cholesterol/liver & renal functions    - Lipid panel - TSH  3.  Abnormal glucose  - Continue diet, exercise  - Lifestyle modifications.  - Monitor appropriate labs   - Hemoglobin A1c - Insulin, random  4. Vitamin D deficiency  - Continue supplementation   - VITAMIN D 25 Hydroxy   5. Hypothyroidism  - TSH  6. Medication management  - CBC with Differential/Platelet - COMPLETE METABOLIC PANEL WITH GFR - Magnesium - Lipid panel - TSH - Hemoglobin A1c - Insulin, random - VITAMIN D 25 Hydroxy          Discussed  regular exercise, BP monitoring, weight control to achieve/maintain BMI less than 25 and discussed med and SE's. Recommended labs to assess /monitor clinical status .  I discussed the assessment and treatment plan with the patient. The patient was provided an opportunity to ask questions and all were answered. The patient agreed with the plan and demonstrated an understanding of the instructions.  I provided over 30 minutes of exam, counseling, chart review and  complex critical decision making.        The patient was advised to call back or seek an in-person evaluation if the symptoms worsen or if the condition fails to improve as anticipated.   Kirtland Bouchard, MD

## 2022-05-13 NOTE — Patient Instructions (Signed)

## 2022-05-14 LAB — COMPLETE METABOLIC PANEL WITH GFR
AG Ratio: 1.6 (calc) (ref 1.0–2.5)
ALT: 24 U/L (ref 6–29)
AST: 24 U/L (ref 10–35)
Albumin: 4.2 g/dL (ref 3.6–5.1)
Alkaline phosphatase (APISO): 76 U/L (ref 37–153)
BUN: 13 mg/dL (ref 7–25)
CO2: 28 mmol/L (ref 20–32)
Calcium: 9 mg/dL (ref 8.6–10.4)
Chloride: 102 mmol/L (ref 98–110)
Creat: 1.05 mg/dL (ref 0.50–1.05)
Globulin: 2.7 g/dL (calc) (ref 1.9–3.7)
Glucose, Bld: 89 mg/dL (ref 65–99)
Potassium: 3.9 mmol/L (ref 3.5–5.3)
Sodium: 139 mmol/L (ref 135–146)
Total Bilirubin: 0.4 mg/dL (ref 0.2–1.2)
Total Protein: 6.9 g/dL (ref 6.1–8.1)
eGFR: 59 mL/min/{1.73_m2} — ABNORMAL LOW (ref >=60)

## 2022-05-14 LAB — CBC WITH DIFFERENTIAL/PLATELET
Absolute Monocytes: 290 cells/uL (ref 200–950)
Basophils Absolute: 50 cells/uL (ref 0–200)
Basophils Relative: 1 %
Eosinophils Absolute: 120 cells/uL (ref 15–500)
Eosinophils Relative: 2.4 %
HCT: 40.3 % (ref 35.0–45.0)
Hemoglobin: 13 g/dL (ref 11.7–15.5)
Lymphs Abs: 1665 cells/uL (ref 850–3900)
MCH: 30.7 pg (ref 27.0–33.0)
MCHC: 32.3 g/dL (ref 32.0–36.0)
MCV: 95.3 fL (ref 80.0–100.0)
MPV: 12.2 fL (ref 7.5–12.5)
Monocytes Relative: 5.8 %
Neutro Abs: 2875 cells/uL (ref 1500–7800)
Neutrophils Relative %: 57.5 %
Platelets: 281 10*3/uL (ref 140–400)
RBC: 4.23 10*6/uL (ref 3.80–5.10)
RDW: 12.7 % (ref 11.0–15.0)
Total Lymphocyte: 33.3 %
WBC: 5 10*3/uL (ref 3.8–10.8)

## 2022-05-14 LAB — LIPID PANEL
Cholesterol: 203 mg/dL — ABNORMAL HIGH (ref <200)
HDL: 71 mg/dL (ref >=50)
LDL Cholesterol (Calc): 113 mg/dL (calc) — ABNORMAL HIGH
Non-HDL Cholesterol (Calc): 132 mg/dL (calc) — ABNORMAL HIGH (ref <130)
Total CHOL/HDL Ratio: 2.9 (calc) (ref <5.0)
Triglycerides: 87 mg/dL (ref <150)

## 2022-05-14 LAB — HEMOGLOBIN A1C
Hgb A1c MFr Bld: 5.9 % of total Hgb — ABNORMAL HIGH (ref <5.7)
Mean Plasma Glucose: 123 mg/dL
eAG (mmol/L): 6.8 mmol/L

## 2022-05-14 LAB — MAGNESIUM: Magnesium: 2.2 mg/dL (ref 1.5–2.5)

## 2022-05-14 LAB — TSH: TSH: 1.74 mIU/L (ref 0.40–4.50)

## 2022-05-14 LAB — INSULIN, RANDOM: Insulin: 74.4 u[IU]/mL — ABNORMAL HIGH

## 2022-05-14 LAB — VITAMIN D 25 HYDROXY (VIT D DEFICIENCY, FRACTURES): Vit D, 25-Hydroxy: 88 ng/mL (ref 30–100)

## 2022-05-14 NOTE — Progress Notes (Signed)
<><><><><><><><><><><><><><><><><><><><><><><><><><><><><><><><><> <><><><><><><><><><><><><><><><><><><><><><><><><><><><><><><><><> - Test results slightly outside the reference range are not unusual. If there is anything important, I will review this with you,  otherwise it is considered normal test values.  If you have further questions,  please do not hesitate to contact me at the office or via My Chart.  <><><><><><><><><><><><><><><><><><><><><><><><><><><><><><><><><> <><><><><><><><><><><><><><><><><><><><><><><><><><><><><><><><><>  -  A1c =5.9%  - Blood sugar and A1c are elevated in the borderline and                                                       early or pre-diabetes range which has the same   300% increased risk for heart attack, stroke, cancer and                                          alzheimer- type vascular dementia as full blown diabetes.   But the good news is that diet, exercise with                                                    weight loss can cure the early diabetes at this point.   -  It is very important that you work harder with diet by                              avoiding all foods that are white except chicken, fish & calliflower.  - Avoid white rice  (brown & wild rice is OK),   - Avoid white potatoes  (sweet potatoes in moderation is OK),   White bread or wheat bread or anything made out of                                      white flour like bagels, donuts, rolls, buns, biscuits, cakes,  - pastries, cookies, pizza crust, and pasta (made from white flour & egg whites)   - vegetarian pasta or spinach or wheat pasta is OK.  - Multigrain breads like Arnold's, Pepper[employer] or                                               multigrain sandwich thins or high fiber breads like   Eureka bread or "Dave's Killer" breads that are 4 to 5 grams fiber per slice !  are best.    Diet, exercise and weight loss can reverse and cure  diabetes in the early stages.   <><><><><><><><><><><><><><><><><><><><><><><><><><><><><><><><><>  - Insulin level = 78 is very high shows insulin resistance                                   ( Normal is less than 20 )   -  and is a sign of early diabetes and associated with a 300 % greater risk for   heart attacks, strokes, cancer & Alzheimer type vascular dementia   - All this can be cured  and prevented with losing weight   - get Dr Fara Olden Fuhrman's book 'the End of Diabetes" and "the End of Dieting"  <><><><><><><><><><><><><><><><><><><><><><><><><><><><><><><><><>  -  -  Total  Chol =   203  - Elevated             (  Ideal  or  Goal is less than 180  !  )  & -  Bad / Dangerous LDL  Chol =    113  - also Elevated              (  Ideal  or  Goal is less than 70  !  )     - So recommend a much stricter diet   - Cholesterol only comes from animal sources                                                                            - ie. meat, dairy, egg yolks  - Eat all the vegetables you want.  - Avoid Meat, Avoid Meat,  Avoid Meat                                                         - especially Red Meat - Beef AND Pork .  - Avoid cheese & dairy - milk & ice cream.     - Cheese is the most concentrated form of trans-fats which                                                                is the worst thing to clog up our arteries.   - Veggie cheese is OK which can be found in the fresh produce section at  \                                                         [employer] or [employer] or [employer]  <><><><><><><><><><><><><><><><><><><><><><><><><><><><><><><><><>  - Thyroid is Normal & actually Since TSH is in yhe lower end of Normal,  that means your thyroid hormone level is in the higher end of Normal Range    <><><><><><><><><><><><><><><><><><><><><><><><><><><><><><><><><>  - Vitamin D = 88 - Excellent  !     Please keep dose same  <><><><><><><><><><><><><><><><><><><><><><><><><><><><><><><><><>  - All Else - CBC - Kidneys - Electrolytes - Liver - Magnesium & Thyroid    - all  Normal / OK <><><><><><><><><><><><><><><><><><><><><><><><><><><><><><><><><> <><><><><><><><><><><><><><><><><><><><><><><><><><><><><><><><><>

## 2022-05-21 ENCOUNTER — Other Ambulatory Visit: Payer: Self-pay | Admitting: Nurse Practitioner

## 2022-05-21 ENCOUNTER — Other Ambulatory Visit (HOSPITAL_COMMUNITY): Payer: Self-pay

## 2022-05-21 DIAGNOSIS — E6609 Other obesity due to excess calories: Secondary | ICD-10-CM

## 2022-05-21 MED ORDER — FUROSEMIDE 40 MG PO TABS
40.0000 mg | ORAL_TABLET | Freq: Every day | ORAL | 3 refills | Status: DC
  Filled 2022-05-21: qty 90, 90d supply, fill #0
  Filled 2022-09-08: qty 90, 90d supply, fill #1
  Filled 2022-12-03: qty 90, 90d supply, fill #2
  Filled 2023-04-21 – 2023-04-24 (×2): qty 90, 90d supply, fill #3

## 2022-05-21 MED ORDER — LEVOTHYROXINE SODIUM 112 MCG PO TABS
112.0000 ug | ORAL_TABLET | Freq: Every day | ORAL | 3 refills | Status: DC
  Filled 2022-05-21 – 2022-07-12 (×3): qty 90, 90d supply, fill #0
  Filled 2022-12-03: qty 90, 90d supply, fill #1
  Filled 2023-04-03: qty 90, 90d supply, fill #2

## 2022-05-21 MED ORDER — PHENTERMINE HCL 37.5 MG PO TABS
18.7500 mg | ORAL_TABLET | Freq: Every morning | ORAL | 0 refills | Status: DC
  Filled 2022-05-21: qty 30, 30d supply, fill #0

## 2022-06-10 ENCOUNTER — Other Ambulatory Visit: Payer: Self-pay

## 2022-06-11 ENCOUNTER — Other Ambulatory Visit (HOSPITAL_COMMUNITY): Payer: Self-pay

## 2022-06-13 ENCOUNTER — Other Ambulatory Visit: Payer: Self-pay | Admitting: Nurse Practitioner

## 2022-06-13 ENCOUNTER — Other Ambulatory Visit (HOSPITAL_COMMUNITY): Payer: Self-pay

## 2022-06-13 MED ORDER — BUPROPION HCL ER (XL) 150 MG PO TB24
150.0000 mg | ORAL_TABLET | Freq: Every morning | ORAL | 2 refills | Status: DC
  Filled 2022-06-13: qty 90, 90d supply, fill #0
  Filled 2022-09-08: qty 90, 90d supply, fill #1
  Filled 2022-12-03: qty 90, 90d supply, fill #2

## 2022-06-14 ENCOUNTER — Other Ambulatory Visit (HOSPITAL_COMMUNITY): Payer: Self-pay

## 2022-07-12 ENCOUNTER — Other Ambulatory Visit: Payer: Self-pay

## 2022-07-12 ENCOUNTER — Other Ambulatory Visit: Payer: Self-pay | Admitting: Nurse Practitioner

## 2022-07-12 ENCOUNTER — Other Ambulatory Visit (HOSPITAL_COMMUNITY): Payer: Self-pay

## 2022-07-12 DIAGNOSIS — E6609 Other obesity due to excess calories: Secondary | ICD-10-CM

## 2022-07-12 MED ORDER — PHENTERMINE HCL 37.5 MG PO TABS
18.7500 mg | ORAL_TABLET | Freq: Every morning | ORAL | 0 refills | Status: DC
  Filled 2022-07-12: qty 30, 30d supply, fill #0

## 2022-08-09 ENCOUNTER — Ambulatory Visit: Payer: Federal, State, Local not specified - PPO | Admitting: Adult Health

## 2022-08-14 ENCOUNTER — Ambulatory Visit: Payer: Federal, State, Local not specified - PPO | Admitting: Nurse Practitioner

## 2022-08-21 ENCOUNTER — Encounter: Payer: Self-pay | Admitting: Nurse Practitioner

## 2022-08-21 ENCOUNTER — Ambulatory Visit: Payer: Federal, State, Local not specified - PPO | Admitting: Nurse Practitioner

## 2022-08-21 VITALS — BP 136/74 | HR 67 | Temp 98.1°F | Ht 62.5 in | Wt 188.0 lb

## 2022-08-21 DIAGNOSIS — E039 Hypothyroidism, unspecified: Secondary | ICD-10-CM | POA: Diagnosis not present

## 2022-08-21 DIAGNOSIS — E538 Deficiency of other specified B group vitamins: Secondary | ICD-10-CM

## 2022-08-21 DIAGNOSIS — R7309 Other abnormal glucose: Secondary | ICD-10-CM | POA: Diagnosis not present

## 2022-08-21 DIAGNOSIS — I10 Essential (primary) hypertension: Secondary | ICD-10-CM

## 2022-08-21 DIAGNOSIS — T148XXA Other injury of unspecified body region, initial encounter: Secondary | ICD-10-CM

## 2022-08-21 DIAGNOSIS — E782 Mixed hyperlipidemia: Secondary | ICD-10-CM

## 2022-08-21 DIAGNOSIS — E559 Vitamin D deficiency, unspecified: Secondary | ICD-10-CM

## 2022-08-21 DIAGNOSIS — R5383 Other fatigue: Secondary | ICD-10-CM

## 2022-08-21 DIAGNOSIS — Z720 Tobacco use: Secondary | ICD-10-CM

## 2022-08-21 DIAGNOSIS — Z79899 Other long term (current) drug therapy: Secondary | ICD-10-CM

## 2022-08-21 NOTE — Progress Notes (Signed)
FOLLOW UP 3 MONTH  Assessment and Plan:   Hypertension Continue medications- ASA, Atenolol, Furosemide, Olmesartan Monitor blood pressure at home; patient to call if consistently greater than 130/80 Continue DASH diet - emphasized low sodium and increasing fluid intake  Reminder to go to the ER if any CP, SOB, nausea, dizziness, severe HA, changes vision/speech, left arm numbness and tingling and jaw pain.  Cholesterol Continue Rosuvastatin Discussed lifestyle modifications. Recommended diet heavy in fruits and veggies, omega 3's. Decrease consumption of animal meats, cheeses, and dairy products. Remain active and exercise as tolerated. Continue to monitor. Check lipids/TSH   Prediabetes Education: Reviewed 'ABCs' of diabetes management  Discussed goals to be met and/or maintained include A1C (<7) Blood pressure (<130/80) Cholesterol (LDL <70) Continue Eye Exam yearly  Continue Dental Exam Q6 mo Discussed dietary recommendations Discussed Physical Activity recommendations Check A1C   Hypothyroidism/ hx of thyroid cancer s/p resection on suppression therapy  Controlled. Continue Levothyroxine. Reminded to take on an empty stomach 30-60mns before food.  Stop any Biotin Supplement 48-72 hours before next TSH level to reduce the risk of falsely low TSH levels. Continue to monitor.     Vitamin D Def/ osteoporosis prevention Continue supplementation Monitor levels  Tobacco use Discussed risks associated with tobacco use and advised to reduce or quit - smoking cessation. Patient is not ready to do so, but advised to consider strongly Will follow up at the next visit  B12 Deficiency/other fatigue Discussed supplement Monitor levels   Medication management All medications discussed and reviewed in full. All questions and concerns regarding medications addressed.    Skin tear left ear Suggested evaluation via Dr. MMelford Aasefor possible closure. Continue to  monitor  Orders Placed This Encounter  Procedures   CBC with Differential/Platelet   COMPLETE METABOLIC PANEL WITH GFR   Lipid panel   TSH   Hemoglobin A1c   Vitamin B12    Notify office for further evaluation and treatment, questions or concerns if any reported s/s fail to improve.   The patient was advised to call back or seek an in-person evaluation if any symptoms worsen or if the condition fails to improve as anticipated.   Further disposition pending results of labs. Discussed med's effects and SE's.    I discussed the assessment and treatment plan with the patient. The patient was provided an opportunity to ask questions and all were answered. The patient agreed with the plan and demonstrated an understanding of the instructions.  Discussed med's effects and SE's. Screening labs and tests as requested with regular follow-up as recommended.  I provided 20 minutes of face-to-face time during this encounter including counseling, chart review, and critical decision making was preformed.   Future Appointments  Date Time Provider DStokes 11/13/2022  2:30 PM MUnk Pinto MD [facility] None  02/12/2023  2:00 PM MUnk Pinto MD [facility] None    ----------------------------------------------------------------------------------------------------------------------  HPI 67y.o. female  presents for 3 month follow up on HTN, HLD, history of prediabetes, post surgical hypothyroid (following thyroid cancer) and vitamin D deficiency.  Overall she reports feeling well.  She has noticed increase in fatigue over the last few weeks.  Possibly r/t most nights averaging 5 hours of sleep or thyroid.  She does care for her 670yo granddaughter.    She is concerned for the left earlobe where she once had an ear piercing.  Has noticed the hole area enlarging over time and feels as though it's going to split.  She can  no longer wear earring in that hole.    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 Last Eval at [facility] discounted that Dx with a Dx/o anomalous Optic Discs and not indeed papilloedema. Has been released by neurology and only follow up if needed.   she currently continues to smoke 0-2 cigarettes a day; discussed risks associated with smoking, patient is not ready to quit.      BMI is Body mass index is 33.84 kg/m., she has been working on diet and exercise. Unable to get weight down despite lifestyle  Wt Readings from Last 3 Encounters:  08/21/22 188 lb (85.3 kg)  05/13/22 183 lb (83 kg)  02/07/22 183 lb 6.4 oz (83.2 kg)   Her blood pressure has been controlled at home, taking atenolol 50 mg BID lasix 20 mg, daily, olmesartan 20 mg, today their BP is BP: 136/74  She reports BP at home range from 120-140/70s.   She does not workout. She denies chest pain, shortness of breath, dizziness, fatigue.    She is on cholesterol medication rosuvastatin and denies myalgias. Her cholesterol is not at goal. The cholesterol last visit was:   Lab Results  Component Value Date   CHOL 203 (H) 05/13/2022   HDL 71 05/13/2022   LDLCALC 113 (H) 05/13/2022   TRIG 87 05/13/2022   CHOLHDL 2.9 05/13/2022    She has been working on diet and exercise for history of prediabetes, and denies increased appetite, nausea, paresthesia of the feet, polydipsia, polyuria, visual disturbances and vomiting. Last A1C in the office was:  Lab Results  Component Value Date   HGBA1C 5.9 (H) 05/13/2022   In 2012, patient had a subtotal thyroidectomy for a MNG and was found to have a small foci of Thyroid cancer. She has been on supressive therapy since. 112 mcg full tab MWF, 1/2 tab all other days- has been stable for a while on this dose   Her medication was not changed last visit.   Lab Results  Component Value Date   TSH 1.74 05/13/2022  . Patient is on  Vitamin D supplement (5000 IU daily) and at goal:    Lab Results  Component Value Date   VD25OH 88 05/13/2022       Current Medications:  Current Outpatient Medications on File Prior to Visit  Medication Sig   aspirin 81 MG tablet Take 81 mg by mouth daily.   atenolol (TENORMIN) 100 MG tablet Take 1 tablet daily for blood pressure.   buPROPion (WELLBUTRIN XL) 150 MG 24 hr tablet Take 1 tablet (150 mg total) by mouth every morning.   Cholecalciferol (VITAMIN D) 125 MCG (5000 UT) CAPS Take 1 capsule Daily   cycloSPORINE (RESTASIS) 0.05 % ophthalmic emulsion Place 1 drop into both eyes 2 (two) times daily.   estradiol (VIVELLE-DOT) 0.025 MG/24HR APPLY 1 PATCH TWICE A WEEK   fexofenadine (ALLEGRA) 180 MG tablet Take 180 mg by mouth daily. Patient OTC Allegra PRN   fluticasone (FLONASE) 50 MCG/ACT nasal spray PLACE 2 SPRAYS INTO BOTH NOSTRILS DAILY.   furosemide (LASIX) 40 MG tablet Take 1 tablet (40 mg total) by mouth daily. For blood pressure and fluid retention.   levothyroxine (SYNTHROID) 112 MCG tablet Take 1 tablet (112 mcg total) by mouth daily on an empty stomach with only water for 30 minutes and no antacids, calcium or magnesium meds for  4 hours and avoid biotin   Magnesium 500 MG CAPS Take by mouth daily.   olmesartan (BENICAR) 40 MG tablet Take 1 tablet by mouth daily for blood pressure.   Omega-3 Fatty Acids (FISH OIL) 1000 MG CAPS Take by mouth daily.   phentermine (ADIPEX-P) 37.5 MG tablet Take 0.5-1 tablets (18.75-37.5 mg total) by mouth every morning. (Patient taking differently: Take 18.75-37.5 mg by mouth every morning. Take 1/2 tablet every other day)   RESTASIS 0.05 % ophthalmic emulsion Instill 1 drop in both eyes twice a day.   rosuvastatin (CRESTOR) 40 MG tablet Take  1/2 tablet   2 x /week  for Cholesterol   vitamin C (ASCORBIC ACID) 500 MG tablet Take 500 mg by mouth daily.   vitamin E 400 UNIT capsule Take 400 Units by mouth daily.   Zinc 25 MG TABS Take by mouth  daily.   No current facility-administered medications on file prior to visit.     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  HENT:  Negative for hearing loss and tinnitus.   Eyes:  Negative for blurred vision and double vision.  Respiratory:  Negative for cough, shortness of breath and wheezing.   Cardiovascular:  Negative for chest pain, palpitations, orthopnea, claudication and leg swelling.  Gastrointestinal:  Negative for abdominal pain, blood in stool, constipation, diarrhea, heartburn, melena, nausea and vomiting.  Genitourinary: Negative.   Musculoskeletal:  Negative for joint pain and myalgias.  Skin:  Negative for rash.  Neurological:  Negative for dizziness, tingling, sensory change, weakness and headaches.  Endo/Heme/Allergies:  Negative for polydipsia.  Psychiatric/Behavioral: Negative.    All other systems reviewed and are negative.   Physical Exam: BP 136/74   Pulse 67   Temp 98.1 F (36.7 C)   Ht 5' 2.5" (1.588 m)   Wt 188 lb (85.3 kg)   SpO2 99%   BMI 33.84 kg/m  Wt Readings from Last 3 Encounters:  08/21/22 188 lb (85.3 kg)  05/13/22 183 lb (83 kg)  02/07/22 183 lb 6.4 oz (83.2 kg)   General Appearance: Well nourished, in no apparent distress. Eyes: PERRLA, EOMs, conjunctiva no swelling or erythema Sinuses: No Frontal/maxillary  tenderness ENT/Mouth: Ext aud canals clear, TMs without erythema, bulging. No erythema, swelling, or exudate on post pharynx.  Tonsils not swollen or erythematous. Hearing normal.  Neck: Supple, thyroid normal. Respiratory: Respiratory effort normal, BS equal bilaterally without rales, rhonchi, wheezing or stridor.  Cardio: RRR with no MRGs. Brisk peripheral pulses without edema.  Abdomen: Soft, + BS.  Non tender, no guarding, rebound, hernias, masses. Lymphatics: Non tender without lymphadenopathy.  Musculoskeletal: Full ROM, 5/5 strength, Normal gait Skin: Left ear with well healed skin tear - see photo below.  Warm, dry without rashes, lesions, ecchymosis.  Neuro: Cranial nerves intact.  No cerebellar symptoms.  Psych: Awake and oriented X 3, normal affect, Insight and Judgment appropriate.       Tammy Jump, NP 11:53 AM [facility]

## 2022-08-21 NOTE — Patient Instructions (Signed)

## 2022-08-22 LAB — CBC WITH DIFFERENTIAL/PLATELET
Absolute Monocytes: 400 cells/uL (ref 200–950)
Basophils Absolute: 41 cells/uL (ref 0–200)
Basophils Relative: 0.7 %
Eosinophils Absolute: 70 cells/uL (ref 15–500)
Eosinophils Relative: 1.2 %
HCT: 40.9 % (ref 35.0–45.0)
Hemoglobin: 13.8 g/dL (ref 11.7–15.5)
Lymphs Abs: 1752 cells/uL (ref 850–3900)
MCH: 31.9 pg (ref 27.0–33.0)
MCHC: 33.7 g/dL (ref 32.0–36.0)
MCV: 94.7 fL (ref 80.0–100.0)
MPV: 12.2 fL (ref 7.5–12.5)
Monocytes Relative: 6.9 %
Neutro Abs: 3538 cells/uL (ref 1500–7800)
Neutrophils Relative %: 61 %
Platelets: 277 10*3/uL (ref 140–400)
RBC: 4.32 10*6/uL (ref 3.80–5.10)
RDW: 12.9 % (ref 11.0–15.0)
Total Lymphocyte: 30.2 %
WBC: 5.8 10*3/uL (ref 3.8–10.8)

## 2022-08-22 LAB — COMPLETE METABOLIC PANEL WITH GFR
AG Ratio: 1.4 (calc) (ref 1.0–2.5)
ALT: 26 U/L (ref 6–29)
AST: 21 U/L (ref 10–35)
Albumin: 4.4 g/dL (ref 3.6–5.1)
Alkaline phosphatase (APISO): 80 U/L (ref 37–153)
BUN: 13 mg/dL (ref 7–25)
CO2: 33 mmol/L — ABNORMAL HIGH (ref 20–32)
Calcium: 9.6 mg/dL (ref 8.6–10.4)
Chloride: 100 mmol/L (ref 98–110)
Creat: 0.97 mg/dL (ref 0.50–1.05)
Globulin: 3.1 g/dL (calc) (ref 1.9–3.7)
Glucose, Bld: 97 mg/dL (ref 65–99)
Potassium: 4.4 mmol/L (ref 3.5–5.3)
Sodium: 139 mmol/L (ref 135–146)
Total Bilirubin: 0.4 mg/dL (ref 0.2–1.2)
Total Protein: 7.5 g/dL (ref 6.1–8.1)
eGFR: 65 mL/min/{1.73_m2} (ref >=60)

## 2022-08-22 LAB — LIPID PANEL
Cholesterol: 221 mg/dL — ABNORMAL HIGH (ref <200)
HDL: 78 mg/dL (ref >=50)
LDL Cholesterol (Calc): 122 mg/dL (calc) — ABNORMAL HIGH
Non-HDL Cholesterol (Calc): 143 mg/dL (calc) — ABNORMAL HIGH (ref <130)
Total CHOL/HDL Ratio: 2.8 (calc) (ref <5.0)
Triglycerides: 106 mg/dL (ref <150)

## 2022-08-22 LAB — VITAMIN B12: Vitamin B-12: 621 pg/mL (ref 200–1100)

## 2022-08-22 LAB — TSH: TSH: 2.54 mIU/L (ref 0.40–4.50)

## 2022-08-22 LAB — HEMOGLOBIN A1C
Hgb A1c MFr Bld: 5.9 % of total Hgb — ABNORMAL HIGH (ref <5.7)
Mean Plasma Glucose: 123 mg/dL
eAG (mmol/L): 6.8 mmol/L

## 2022-09-08 ENCOUNTER — Other Ambulatory Visit: Payer: Self-pay | Admitting: Nurse Practitioner

## 2022-09-08 DIAGNOSIS — E6609 Other obesity due to excess calories: Secondary | ICD-10-CM

## 2022-09-08 MED FILL — Atenolol Tab 100 MG: ORAL | 90 days supply | Qty: 90 | Fill #3 | Status: AC

## 2022-09-09 ENCOUNTER — Other Ambulatory Visit: Payer: Self-pay

## 2022-09-09 ENCOUNTER — Other Ambulatory Visit (HOSPITAL_COMMUNITY): Payer: Self-pay

## 2022-09-09 MED ORDER — PHENTERMINE HCL 37.5 MG PO TABS
18.7500 mg | ORAL_TABLET | Freq: Every morning | ORAL | 0 refills | Status: DC
  Filled 2022-09-09: qty 30, 30d supply, fill #0

## 2022-09-25 ENCOUNTER — Other Ambulatory Visit (HOSPITAL_COMMUNITY): Payer: Self-pay

## 2022-09-25 MED ORDER — ESTRADIOL 0.025 MG/24HR TD PTTW
1.0000 | MEDICATED_PATCH | TRANSDERMAL | 4 refills | Status: DC
  Filled 2022-09-25 – 2022-12-03 (×2): qty 24, 84d supply, fill #0

## 2022-10-04 ENCOUNTER — Other Ambulatory Visit (HOSPITAL_COMMUNITY): Payer: Self-pay

## 2022-11-13 ENCOUNTER — Ambulatory Visit: Payer: Federal, State, Local not specified - PPO | Admitting: Internal Medicine

## 2022-11-20 ENCOUNTER — Ambulatory Visit: Payer: Federal, State, Local not specified - PPO | Admitting: Internal Medicine

## 2022-11-26 ENCOUNTER — Ambulatory Visit (INDEPENDENT_AMBULATORY_CARE_PROVIDER_SITE_OTHER): Payer: Federal, State, Local not specified - PPO | Admitting: Internal Medicine

## 2022-11-26 ENCOUNTER — Other Ambulatory Visit (HOSPITAL_COMMUNITY): Payer: Self-pay

## 2022-11-26 ENCOUNTER — Encounter: Payer: Self-pay | Admitting: Internal Medicine

## 2022-11-26 VITALS — BP 122/70 | HR 74 | Temp 97.9°F | Resp 16 | Ht 62.5 in | Wt 192.4 lb

## 2022-11-26 DIAGNOSIS — Z6835 Body mass index (BMI) 35.0-35.9, adult: Secondary | ICD-10-CM

## 2022-11-26 DIAGNOSIS — R7309 Other abnormal glucose: Secondary | ICD-10-CM | POA: Diagnosis not present

## 2022-11-26 DIAGNOSIS — I10 Essential (primary) hypertension: Secondary | ICD-10-CM

## 2022-11-26 DIAGNOSIS — E782 Mixed hyperlipidemia: Secondary | ICD-10-CM

## 2022-11-26 DIAGNOSIS — E559 Vitamin D deficiency, unspecified: Secondary | ICD-10-CM

## 2022-11-26 DIAGNOSIS — Z79899 Other long term (current) drug therapy: Secondary | ICD-10-CM | POA: Diagnosis not present

## 2022-11-26 MED ORDER — PHENTERMINE HCL 37.5 MG PO TABS
18.7500 mg | ORAL_TABLET | Freq: Every morning | ORAL | 1 refills | Status: AC
  Filled 2022-11-26: qty 90, 90d supply, fill #0
  Filled 2023-04-21 – 2023-04-24 (×2): qty 90, 90d supply, fill #1

## 2022-11-26 MED ORDER — TOPIRAMATE 50 MG PO TABS
25.0000 mg | ORAL_TABLET | Freq: Two times a day (BID) | ORAL | 1 refills | Status: AC
  Filled 2022-11-26: qty 180, 90d supply, fill #0
  Filled 2023-04-21 – 2023-04-24 (×2): qty 180, 90d supply, fill #1

## 2022-11-26 NOTE — Progress Notes (Signed)
Future Appointments  Date Time Provider Department  11/26/2022                     3 mo 11:30 AM Lucky Cowboy, MD [facility]  02/28/2023                      cpe 11:00 AM Lucky Cowboy, MD [facility]    History of Present Illness:       This very nice 67 y.o.  reMBF  presents for 9  month follow up with HTN, HLD, Pre-Diabetes, Hypothyroidism  and Vitamin D Deficiency.                                           In 2012, Patient had a subtotal thyroidectomy for a MNG and was found to have a small foci of Thyroid cancer. She has been on supressive therapy since.          Patient is treated for HTN  (1992)  & BP has been controlled at home. Today's BP is at goal - 122/70 . Patient has had no complaints of any cardiac type chest pain, palpitations, dyspnea Pollyann Kennedy /PND, dizziness, claudication or dependent edema.        Hyperlipidemia is controlled with diet & Rosuvastatin . Patient denies myalgias or other med SE's. Last Lipids were at goal :  Lab Results  Component Value Date   CHOL 140 02/07/2022   HDL 72 02/07/2022   LDLCALC 53 02/07/2022   TRIG 73 02/07/2022   CHOLHDL 1.9 02/07/2022     Also, the patient has history of prediabetes (A1c 5.8% /2011) and has had no symptoms of reactive hypoglycemia, diabetic polys, paresthesias or visual blurring.  Last A1c was at goal :  Lab Results  Component Value Date   HGBA1C 5.6 02/07/2022     Wt Readings from Last 3 Encounters:  11/26/22 192 lb 6.4 oz (87.3 kg)  08/21/22 188 lb (85.3 kg)  05/13/22 183 lb (83 kg)                                                         Further, the patient also has history of Vitamin D Deficiency ("33" /2008) and supplements vitamin D . Last vitamin D was borderline elevated :  Lab Results  Component Value Date   VD25OH 105 (H) 02/07/2022     Current Outpatient Medications on File Prior to Visit  Medication Sig   aspirin 81 MG tablet Take daily.   atenolol 100 MG tablet Take  1 tablet daily    buPROPion  XL 150 MG  TAKE 1 TABLET  DAILY    VITAMIN D 5000 units  take  5,000 Units daily   RESTASIS 0.05 % ophth emulsion Instill 1 drop in both eyes twice a day   estradiol 0.025 MG/24HR APPLY 1 PATCH TWICE A WEEK   fexofenadine 180 MG tablet Take daily   FLONASE  nasal spray 2 SPRAYS INTO BOTH NOSTRILS DAILY.   furosemide  40 MG tablet TAKE 1 TABLET  DAILY    levothyroxine  112 MCG tablet Take 1 tablet daily    Magnesium 500 MG CAPS Take  daily.   olmesartan  40 MG tablet Take 1 tablet daily for blood pressure.   Omega-3  FISH OIL 1000 MG CAPS Take daily.   phentermine 37.5 MG tablet Take  1/2 to 1 tablet  every morning    rosuvastatin  40 MG tablet TAKE 1/2 TABLET  2 x/week   vitamin C  500 MG tablet Take daily.   vitamin E 400 UNIT  Take daily.   Zinc 25 MG TABS Take  daily.      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 Eyes: Denies redness, blurred vision, diplopia, discharge, itchy,  watery eyes.  ENT: Denies discharge, congestion, post nasal drip, epistaxis, sore throat, earache, hearing loss, dental pain, tinnitus, vertigo, sinus pain, snoring.  CV: Denies chest pain, palpitations, irregular heartbeat, syncope, dyspnea, diaphoresis, orthopnea, PND, claudication or edema. Respiratory: denies cough, dyspnea, DOE, pleurisy, hoarseness, laryngitis, wheezing.  Gastrointestinal: Denies dysphagia, odynophagia, heartburn, reflux, water brash, abdominal pain or cramps, nausea, vomiting, bloating, diarrhea, constipation, hematemesis, melena, hematochezia  or hemorrhoids. Genitourinary: Denies dysuria, frequency, urgency, nocturia, hesitancy, discharge, hematuria or flank pain. Musculoskeletal: Denies arthralgias, myalgias, stiffness, jt. swelling, pain, limping or strain/sprain.  Skin: Denies pruritus, rash, hives, warts, acne, eczema or change in skin lesion(s). Neuro: No weakness, tremor, incoordination, spasms, paresthesia or pain. Psychiatric: Denies confusion, memory loss or sensory loss. Endo: Denies change in weight, skin or hair change.  Heme/Lymph: No excessive bleeding, bruising or enlarged lymph nodes.  Physical Exam  BP 122/70   Pulse 74   Temp 97.9 F (36.6 C)   Resp 16   Ht 5' 2.5" (1.588 m)   Wt 192 lb 6.4 oz (87.3 kg)   SpO2 99%   BMI 34.63 kg/m   Appears  well nourished, well groomed  and in no distress.  Eyes: PERRLA, EOMs, conjunctiva no swelling or erythema. Sinuses: No frontal/maxillary tenderness ENT/Mouth: EAC's clear, TM's nl w/o erythema, bulging. Nares clear w/o erythema, swelling, exudates. Oropharynx clear without erythema or exudates. Oral hygiene is good. Tongue normal, non obstructing. Hearing intact.  Neck: Supple. Thyroid not palpable. Car 2+/2+ without bruits, nodes or JVD. Chest: Respirations nl with BS clear & equal w/o rales, rhonchi, wheezing or stridor.  Cor: Heart sounds normal w/ regular rate and rhythm without sig. murmurs,  gallops, clicks or rubs. Peripheral pulses normal and equal  without edema.  Abdomen: Soft & bowel sounds normal. Non-tender w/o guarding, rebound,  hernias, masses or organomegaly.  Lymphatics: Unremarkable.  Musculoskeletal: Full ROM all peripheral extremities, joint stability, 5/5 strength and normal gait.  Skin: Warm, dry without exposed rashes, lesions or ecchymosis apparent.  Neuro: Cranial nerves intact, reflexes equal bilaterally. Sensory-motor testing grossly intact. Tendon reflexes grossly intact.  Pysch: Alert & oriented x 3.  Insight and judgement nl & appropriate. No ideations.  Assessment and Plan:  1. Essential hypertension  - Continue medication, monitor blood pressure at home.  - Continue DASH diet.  Reminder to go to the ER if any CP,  SOB, nausea, dizziness, severe HA, changes vision/speech.   - CBC with Differential/Platelet - COMPLETE METABOLIC PANEL WITH GFR - Magnesium - TSH   2. Hyperlipidemia, mixed   - Continue diet/meds, exercise,& lifestyle modifications.  - Continue monitor periodic cholesterol/liver & renal functions     - Lipid panel - TSH   3. Abnormal glucose  - Continue diet, exercise  - Lifestyle modifications.  - Monitor appropriate labs  - Hemoglobin A1c - Insulin, random   4. Vitamin D deficiency  - Continue supplementation   - VITAMIN D 25 Hydroxy    5. Class 2 severe obesity due to excess calories with serious comorbidity  and body mass index (BMI) of 35.0 to 35.9 in adult (HCC)  - topiramate (TOPAMAX) 50 MG tablet;  Take 0.5-1 tablets (25-50 mg total) by mouth 2 (two) times daily at suppertime & bedtime for dieting & weight loss  Dispense: 180 tablet; Refill: 1  - phentermine (ADIPEX-P) 37.5 MG tablet;  Take 0.5-1 tablets (18.75-37.5 mg total) by mouth every morning for dieting & weight loss   Dispense: 90 tablet; Refill: 1   6. Medication management  - CBC with Differential/Platelet - COMPLETE METABOLIC PANEL WITH  GFR - Magnesium - Lipid panel - TSH - Hemoglobin A1c - Insulin, random - VITAMIN D 25 Hydroxy          Discussed  regular exercise, BP monitoring, weight control to achieve/maintain BMI less than 25 and discussed med and SE's. Recommended labs to assess /monitor clinical status .  I discussed the assessment and treatment plan with the patient. The patient was provided an opportunity to ask questions and all were answered. The patient agreed with the plan and demonstrated an understanding of the instructions.  I provided over 30 minutes of exam, counseling, chart review and  complex critical decision making.        The patient was advised to call back or seek an in-person evaluation if the symptoms worsen or if the condition fails to improve as anticipated.   Marinus Maw, MD

## 2022-11-26 NOTE — Patient Instructions (Signed)

## 2022-11-27 LAB — LIPID PANEL
Cholesterol: 182 mg/dL (ref <200)
HDL: 76 mg/dL (ref >=50)
LDL Cholesterol (Calc): 86 mg/dL (calc)
Non-HDL Cholesterol (Calc): 106 mg/dL (calc) (ref <130)
Total CHOL/HDL Ratio: 2.4 (calc) (ref <5.0)
Triglycerides: 105 mg/dL (ref <150)

## 2022-11-27 LAB — CBC WITH DIFFERENTIAL/PLATELET
Absolute Monocytes: 504 cells/uL (ref 200–950)
Basophils Absolute: 42 cells/uL (ref 0–200)
Basophils Relative: 0.7 %
Eosinophils Absolute: 78 cells/uL (ref 15–500)
Eosinophils Relative: 1.3 %
HCT: 41 % (ref 35.0–45.0)
Hemoglobin: 13.4 g/dL (ref 11.7–15.5)
Lymphs Abs: 1506 cells/uL (ref 850–3900)
MCH: 31 pg (ref 27.0–33.0)
MCHC: 32.7 g/dL (ref 32.0–36.0)
MCV: 94.9 fL (ref 80.0–100.0)
MPV: 12.1 fL (ref 7.5–12.5)
Monocytes Relative: 8.4 %
Neutro Abs: 3870 cells/uL (ref 1500–7800)
Neutrophils Relative %: 64.5 %
Platelets: 253 10*3/uL (ref 140–400)
RBC: 4.32 10*6/uL (ref 3.80–5.10)
RDW: 12.6 % (ref 11.0–15.0)
Total Lymphocyte: 25.1 %
WBC: 6 10*3/uL (ref 3.8–10.8)

## 2022-11-27 LAB — COMPLETE METABOLIC PANEL WITH GFR
AG Ratio: 1.4 (calc) (ref 1.0–2.5)
ALT: 21 U/L (ref 6–29)
AST: 17 U/L (ref 10–35)
Albumin: 4.3 g/dL (ref 3.6–5.1)
Alkaline phosphatase (APISO): 89 U/L (ref 37–153)
BUN: 16 mg/dL (ref 7–25)
CO2: 27 mmol/L (ref 20–32)
Calcium: 9.3 mg/dL (ref 8.6–10.4)
Chloride: 101 mmol/L (ref 98–110)
Creat: 0.99 mg/dL (ref 0.50–1.05)
Globulin: 3 g/dL (calc) (ref 1.9–3.7)
Glucose, Bld: 94 mg/dL (ref 65–99)
Potassium: 4.2 mmol/L (ref 3.5–5.3)
Sodium: 139 mmol/L (ref 135–146)
Total Bilirubin: 0.4 mg/dL (ref 0.2–1.2)
Total Protein: 7.3 g/dL (ref 6.1–8.1)
eGFR: 63 mL/min/{1.73_m2} (ref >=60)

## 2022-11-27 LAB — TSH: TSH: 1.84 mIU/L (ref 0.40–4.50)

## 2022-11-27 LAB — HEMOGLOBIN A1C
Hgb A1c MFr Bld: 5.8 % of total Hgb — ABNORMAL HIGH (ref <5.7)
Mean Plasma Glucose: 120 mg/dL
eAG (mmol/L): 6.6 mmol/L

## 2022-11-27 LAB — INSULIN, RANDOM: Insulin: 18.5 u[IU]/mL — ABNORMAL HIGH

## 2022-11-27 LAB — VITAMIN D 25 HYDROXY (VIT D DEFICIENCY, FRACTURES): Vit D, 25-Hydroxy: 79 ng/mL (ref 30–100)

## 2022-11-27 LAB — MAGNESIUM: Magnesium: 2.1 mg/dL (ref 1.5–2.5)

## 2022-11-27 NOTE — Progress Notes (Signed)
^<^<^<^<^<^<^<^<^<^<^<^<^<^<^<^<^<^<^<^<^<^<^<^<^<^<^<^<^<^<^<^<^<^<^<^<^ ^>^>^>^>^>^>^>^>^>^>^>>^>^>^>^>^>^>^>^>^>^>^>^>^>^>^>^>^>^>^>^>^>^>^>^>^>  -  Test results slightly outside the reference range are not unusual. If there is anything important, I will review this with you,  otherwise it is considered normal test values.  If you have further questions,  please do not hesitate to contact me at the office or via My Chart.   ^<^<^<^<^<^<^<^<^<^<^<^<^<^<^<^<^<^<^<^<^<^<^<^<^<^<^<^<^<^<^<^<^<^<^<^<^ ^>^>^>^>^>^>^>^>^>^>^>^>^>^>^>^>^>^>^>^>^>^>^>^>^>^>^>^>^>^>^>^>^>^>^>^>^  -  A1c = 5.8%  - Still borderline elevate 12 week average blood sugar  Blood sugar and A1c are elevated in the borderline and                                                         early or pre-diabetes range which has the same   300% increased risk for heart attack, stroke, cancer and                                          alzheimer- type vascular dementia as full blown diabetes.   But the good news is that diet, exercise with                                                     weight loss can cure the early diabetes at this point.  ^<^<^<^<^<^<^<^<^<^<^<^<^<^<^<^<^<^<^<^<^<^<^<^<^<^<^<^<^<^<^<^<^<^<^<^<^ ^>^>^>^>^>^>^>^>^>^>^>^>^>^>^>^>^>^>^>^>^>^>^>^>^>^>^>^>^>^>^>^>^>^>^>^>^  -  Lipoids better   Chol down to 182   & LDL down to 86  - Wonderful  !  ^<^<^<^<^<^<^<^<^<^<^<^<^<^<^<^<^<^<^<^<^<^<^<^<^<^<^<^<^<^<^<^<^<^<^<^<^ ^>^>^>^>^>^>^>^>^>^>^>^>^>^>^>^>^>^>^>^>^>^>^>^>^>^>^>^>^>^>^>^>^>^>^>^>^  -  Vit  -= 79  - Excellent  !    Please keep dosage    same  ^<^<^<^<^<^<^<^<^<^<^<^<^<^<^<^<^<^<^<^<^<^<^<^<^<^<^<^<^<^<^<^<^<^<^<^<^ ^>^>^>^>^>^>^>^>^>^>^>^>^>^>^>^>^>^>^>^>^>^>^>^>^>^>^>^>^>^>^>^>^>^>^>^>^  -

## 2022-11-28 ENCOUNTER — Other Ambulatory Visit (HOSPITAL_COMMUNITY): Payer: Self-pay

## 2022-12-03 ENCOUNTER — Other Ambulatory Visit (HOSPITAL_COMMUNITY): Payer: Self-pay

## 2022-12-03 ENCOUNTER — Other Ambulatory Visit: Payer: Self-pay

## 2022-12-26 IMAGING — US US THYROID
1 series · 14 of 25 positions shown · non-contrast
Comparison: Prior thyroid ultrasound 08/15/2014

CLINICAL DATA: History of thyroid cancer status post bilateral
thyroidectomy in 8878

EXAM:
THYROID ULTRASOUND
TECHNIQUE: Ultrasound examination of the thyroid gland and adjacent soft
tissues was performed.

[Series 1: us thyroid · 44 acquisitions, 14 frames shown]
[im 1/44]
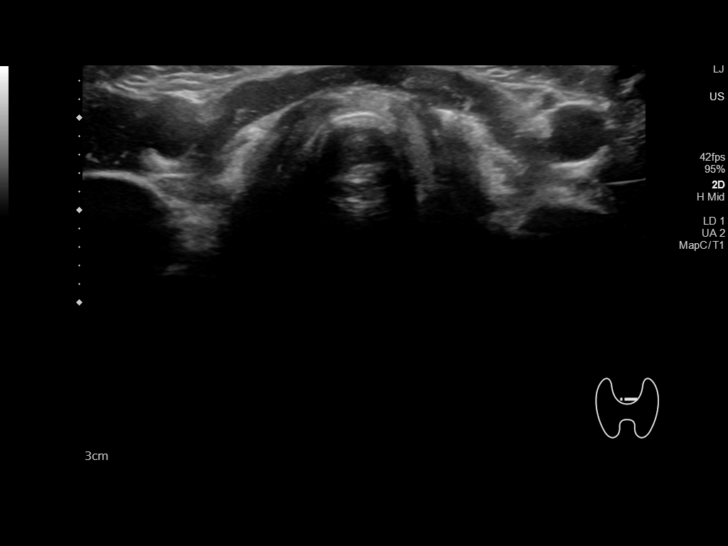
[im 4/44]
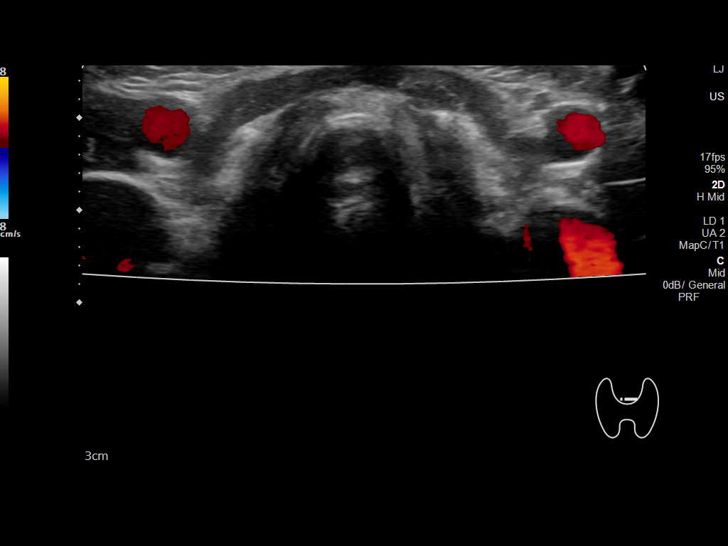
[im 8/44]
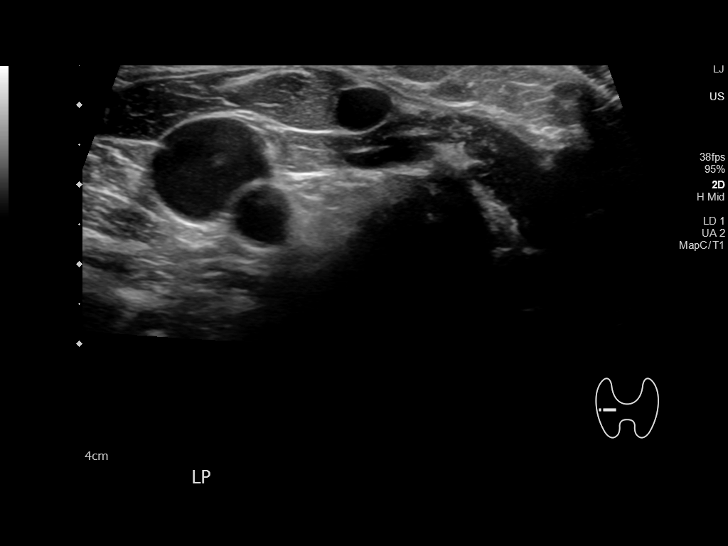
[im 11/44]
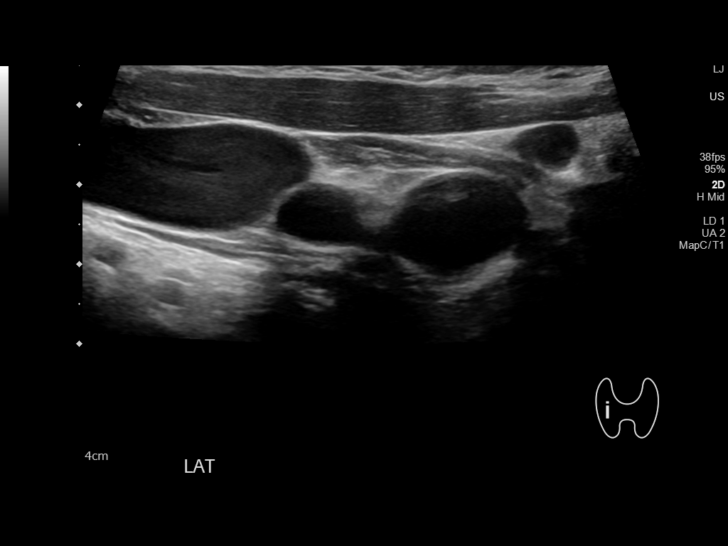
[im 15/44]
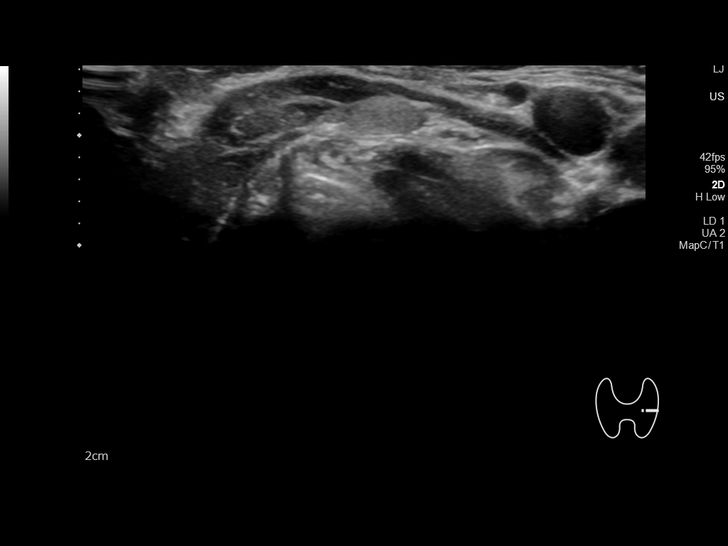
[im 17/44]
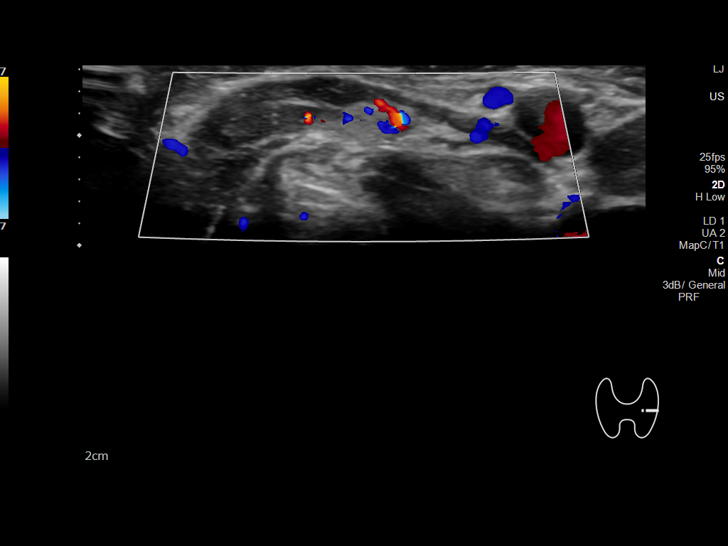
[im 20/44]
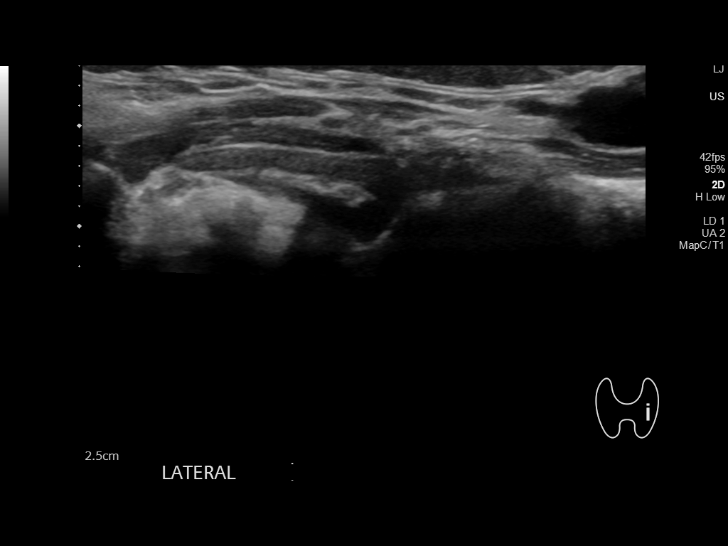
[im 24/44]
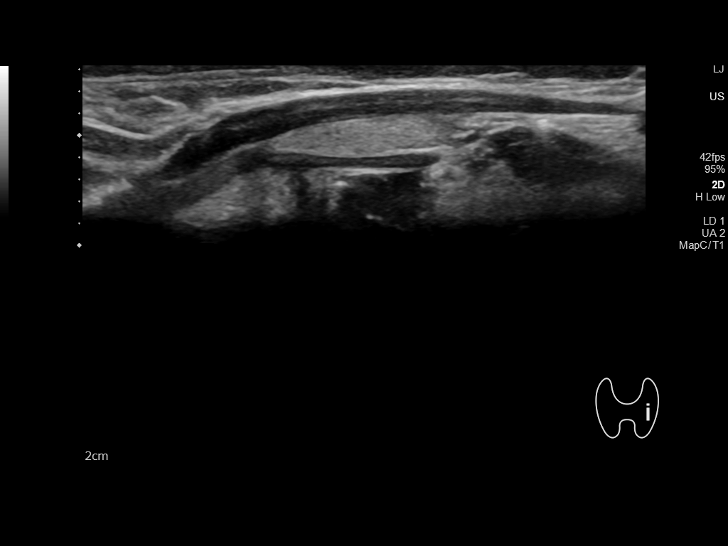
[im 27/44]
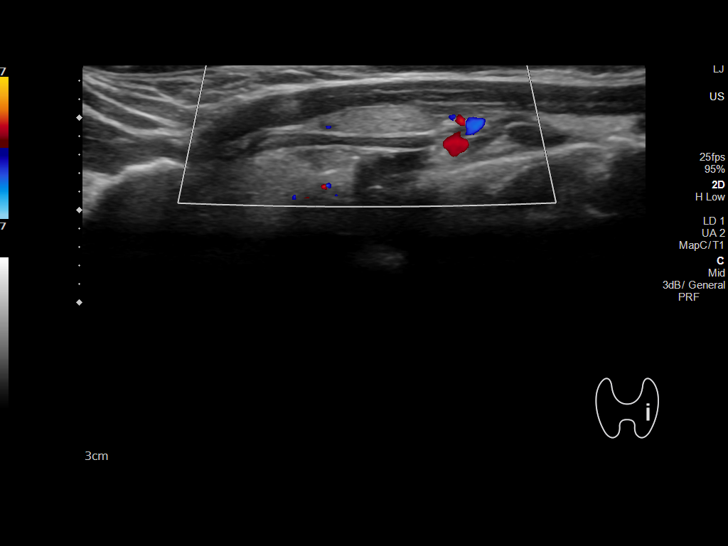
[im 29/44]
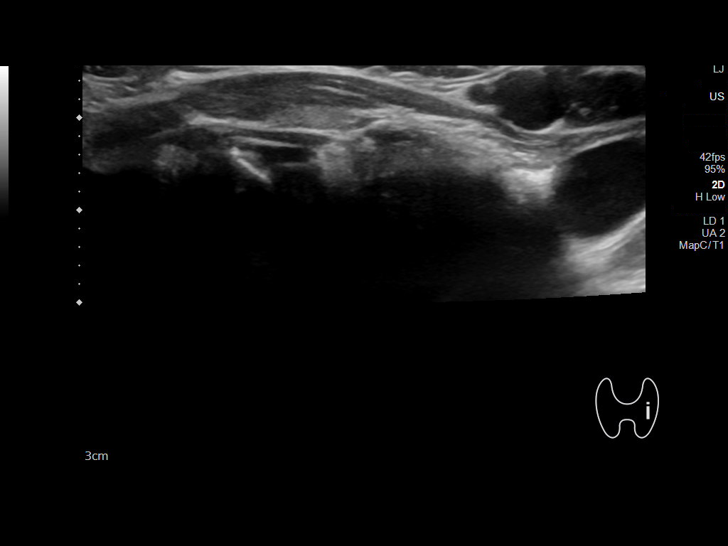
[im 33/44]
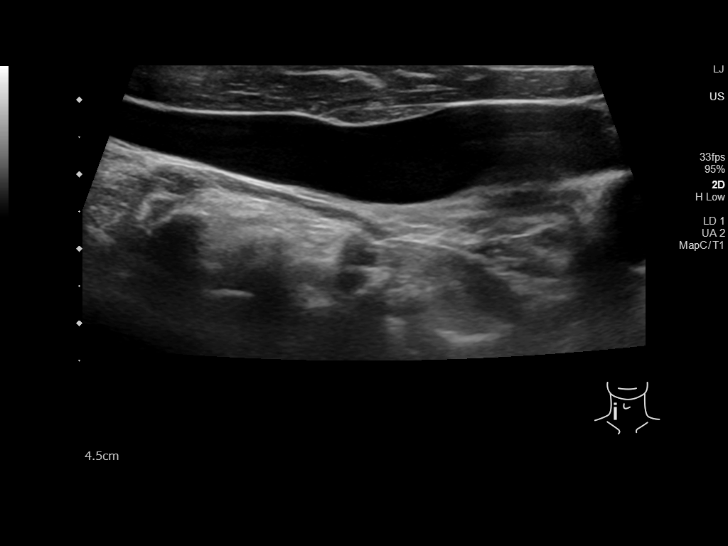
[im 36/44]
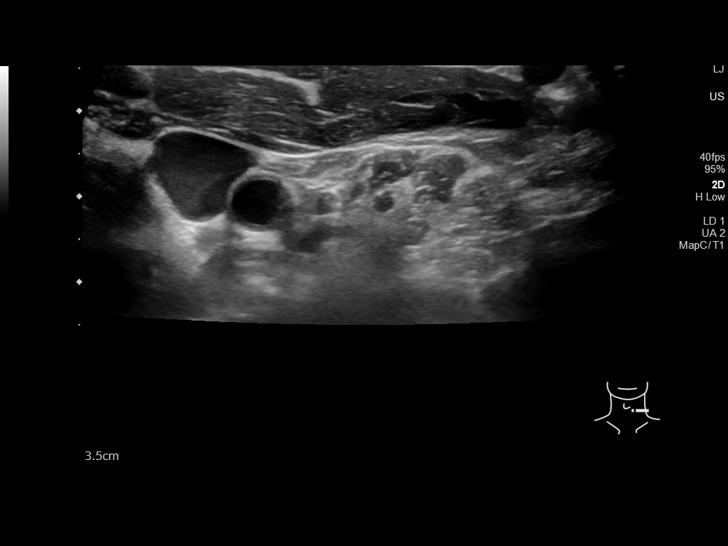
[im 40/44]
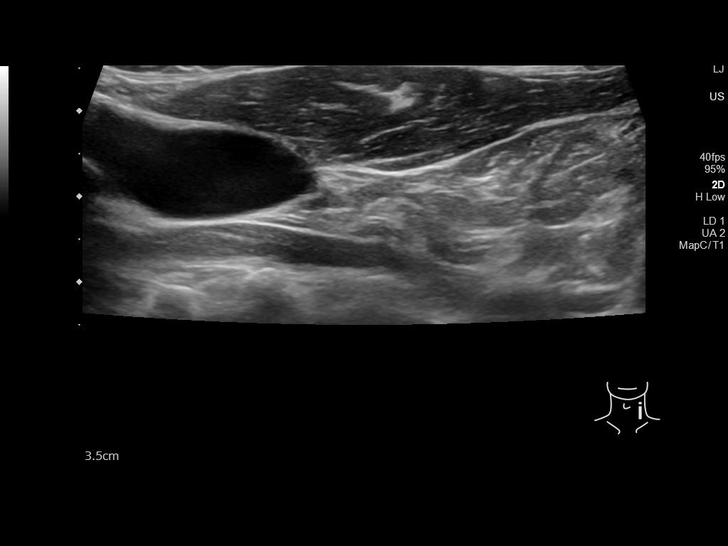
[im 44/44]
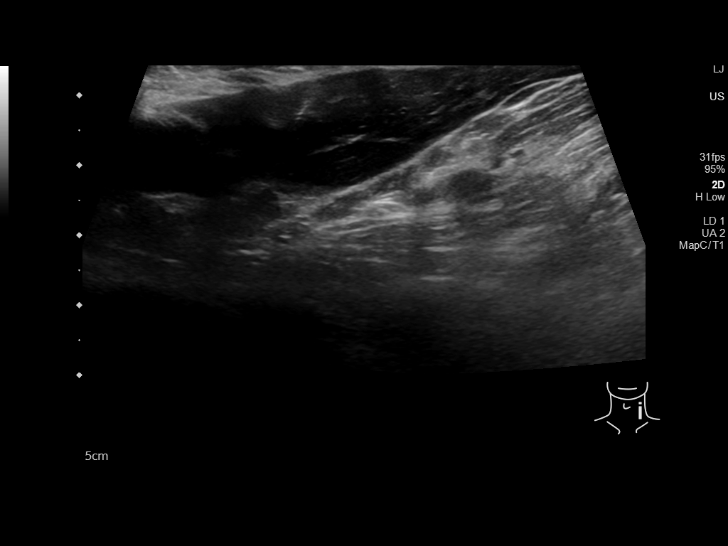

[14 of 25 positions shown; findings below may reference images not displayed]

FINDINGS: The thyroid gland is surgically absent. There is a tiny ovoid shaped
collection of homogeneous echogenic tissue in the left thyroid
resection bed measuring 1.7 cm in craniocaudal dimension and 0.4 x
0.8 cm in transverse dimension. This may represent a small amount of
recurrent thyroid tissue. No nodularity, calcifications or other
abnormal features. No suspicious lymphadenopathy.
IMPRESSION: 1. Surgical changes of total thyroidectomy.
2. Probable recurrent thyroid tissue in the left resection bed
measuring 1.7 x 0.4 x 0.8 cm. No microcalcifications, nodularity or
other suspicious features.

## 2023-01-01 ENCOUNTER — Other Ambulatory Visit: Payer: Self-pay

## 2023-01-01 ENCOUNTER — Other Ambulatory Visit (HOSPITAL_COMMUNITY): Payer: Self-pay

## 2023-01-01 ENCOUNTER — Other Ambulatory Visit: Payer: Self-pay | Admitting: Internal Medicine

## 2023-01-01 DIAGNOSIS — I10 Essential (primary) hypertension: Secondary | ICD-10-CM

## 2023-01-01 MED ORDER — OLMESARTAN MEDOXOMIL 40 MG PO TABS
ORAL_TABLET | ORAL | 3 refills | Status: DC
Start: 2023-01-01 — End: 2024-02-11
  Filled 2023-01-01: qty 90, 90d supply, fill #0
  Filled 2023-06-03: qty 90, 90d supply, fill #1

## 2023-01-03 ENCOUNTER — Other Ambulatory Visit (HOSPITAL_COMMUNITY): Payer: Self-pay

## 2023-01-14 ENCOUNTER — Other Ambulatory Visit (HOSPITAL_COMMUNITY): Payer: Self-pay

## 2023-01-31 ENCOUNTER — Other Ambulatory Visit (HOSPITAL_COMMUNITY): Payer: Self-pay

## 2023-02-12 ENCOUNTER — Encounter: Payer: Federal, State, Local not specified - PPO | Admitting: Internal Medicine

## 2023-02-18 ENCOUNTER — Encounter: Payer: Self-pay | Admitting: Internal Medicine

## 2023-02-28 ENCOUNTER — Other Ambulatory Visit: Payer: Self-pay | Admitting: Nurse Practitioner

## 2023-02-28 ENCOUNTER — Ambulatory Visit (INDEPENDENT_AMBULATORY_CARE_PROVIDER_SITE_OTHER): Payer: Federal, State, Local not specified - PPO | Admitting: Internal Medicine

## 2023-02-28 ENCOUNTER — Encounter: Payer: Self-pay | Admitting: Internal Medicine

## 2023-02-28 ENCOUNTER — Other Ambulatory Visit (HOSPITAL_COMMUNITY): Payer: Self-pay

## 2023-02-28 VITALS — BP 136/80 | HR 88 | Temp 97.9°F | Resp 16 | Ht 62.5 in | Wt 185.2 lb

## 2023-02-28 DIAGNOSIS — Z0000 Encounter for general adult medical examination without abnormal findings: Secondary | ICD-10-CM

## 2023-02-28 DIAGNOSIS — E782 Mixed hyperlipidemia: Secondary | ICD-10-CM

## 2023-02-28 DIAGNOSIS — Z13220 Encounter for screening for lipoid disorders: Secondary | ICD-10-CM | POA: Diagnosis not present

## 2023-02-28 DIAGNOSIS — Z1389 Encounter for screening for other disorder: Secondary | ICD-10-CM

## 2023-02-28 DIAGNOSIS — E559 Vitamin D deficiency, unspecified: Secondary | ICD-10-CM | POA: Diagnosis not present

## 2023-02-28 DIAGNOSIS — R7309 Other abnormal glucose: Secondary | ICD-10-CM

## 2023-02-28 DIAGNOSIS — F17200 Nicotine dependence, unspecified, uncomplicated: Secondary | ICD-10-CM

## 2023-02-28 DIAGNOSIS — Z131 Encounter for screening for diabetes mellitus: Secondary | ICD-10-CM

## 2023-02-28 DIAGNOSIS — Z1211 Encounter for screening for malignant neoplasm of colon: Secondary | ICD-10-CM

## 2023-02-28 DIAGNOSIS — I10 Essential (primary) hypertension: Secondary | ICD-10-CM

## 2023-02-28 DIAGNOSIS — Z79899 Other long term (current) drug therapy: Secondary | ICD-10-CM

## 2023-02-28 DIAGNOSIS — Z8249 Family history of ischemic heart disease and other diseases of the circulatory system: Secondary | ICD-10-CM

## 2023-02-28 DIAGNOSIS — E039 Hypothyroidism, unspecified: Secondary | ICD-10-CM

## 2023-02-28 DIAGNOSIS — Z136 Encounter for screening for cardiovascular disorders: Secondary | ICD-10-CM

## 2023-02-28 DIAGNOSIS — Z0001 Encounter for general adult medical examination with abnormal findings: Secondary | ICD-10-CM

## 2023-02-28 MED ORDER — TRIAMCINOLONE ACETONIDE 0.1 % EX CREA
1.0000 | TOPICAL_CREAM | Freq: Two times a day (BID) | CUTANEOUS | 3 refills | Status: AC
  Filled 2023-02-28: qty 80, 30d supply, fill #0
  Filled 2023-09-26 – 2023-09-29 (×2): qty 80, 30d supply, fill #1

## 2023-02-28 MED ORDER — BUPROPION HCL ER (XL) 150 MG PO TB24
150.0000 mg | ORAL_TABLET | Freq: Every morning | ORAL | 2 refills | Status: DC
  Filled 2023-02-28: qty 90, 90d supply, fill #0

## 2023-02-28 MED ORDER — DEXAMETHASONE 4 MG PO TABS
ORAL_TABLET | ORAL | 1 refills | Status: DC
  Filled 2023-02-28: qty 20, 11d supply, fill #0

## 2023-02-28 MED ORDER — ATENOLOL 100 MG PO TABS
ORAL_TABLET | ORAL | 3 refills | Status: AC
  Filled 2023-02-28: qty 90, 90d supply, fill #0
  Filled 2023-09-26 – 2023-09-29 (×2): qty 90, 90d supply, fill #1
  Filled 2024-02-27 (×2): qty 90, 90d supply, fill #2

## 2023-02-28 MED ORDER — BUPROPION HCL ER (XL) 150 MG PO TB24
150.0000 mg | ORAL_TABLET | Freq: Every morning | ORAL | 3 refills | Status: DC
  Filled 2023-02-28: qty 90, fill #0
  Filled 2023-03-04: qty 90, 90d supply, fill #0
  Filled 2023-06-03: qty 90, 90d supply, fill #1
  Filled 2023-08-10: qty 90, 90d supply, fill #2
  Filled 2023-11-30 – 2023-12-05 (×2): qty 90, 90d supply, fill #3

## 2023-02-28 NOTE — Progress Notes (Signed)
Annual Screening/Preventative Visit & Comprehensive Evaluation &  Examination   Future Appointments  Date Time Provider Department  02/28/2023                 cpe 11:00 AM Lucky Cowboy, MD [facility]  06/03/2023               3 mo  11:30 AM Adela Glimpse, NP [facility]  09/04/2023                 6 mo  11:30 AM Lucky Cowboy, MD [facility]  12/04/2023                 9 mo 11:30 AM Adela Glimpse, NP [facility]  03/09/2024                  cpe 11:00 AM Lucky Cowboy, MD [facility]        This very nice 67 y.o. DBF presents for a Screening /Preventative Visit & comprehensive evaluation and management of multiple medical co-morbidities.  Patient has been followed for HTN, HLD, Prediabetes  and Vitamin D Deficiency.    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  Patient's BP has been controlled at home and patient denies any cardiac symptoms as chest pain, palpitations, shortness of breath, dizziness or ankle swelling. Today's BP is at goal - 136/80.        Patient's hyperlipidemia is not controlled with diet and Rosuvastatin. Patient denies myalgias or other medication SE's. Last lipids were not at goal ( ? off meds):  Lab Results  Component Value Date   CHOL 182 11/26/2022   HDL 76 11/26/2022   LDLCALC 86 11/26/2022   TRIG 105 11/26/2022   CHOLHDL 2.4 11/26/2022         Patient has hx/o prediabetes (A1c 5.8% /2011) and patient denies reactive hypoglycemic symptoms, visual blurring, diabetic polys or paresthesias. Last A1c was near goal:  Lab Results  Component Value Date   HGBA1C 5.8 (H) 11/26/2022   Wt Readings from Last 3 Encounters:  02/28/23 185 lb 3.2 oz (84 kg)  11/26/22 192 lb 6.4 oz (87.3 kg)   08/21/22 188 lb (85.3 kg)                                           In 2012, Patient had a subtotal thyroidectomy for a MNG and was found to have a small foci of Thyroid cancer. She has been on supressive therapy since.        Finally, patient has history of Vitamin D Deficiency and last Vitamin D was at goal:  Lab Results  Component Value Date   VD25OH 79 11/26/2022       Current Outpatient Medications on File Prior to Visit  Medication Sig   aspirin 81 MG tablet Take  daily.   atenolol 100 MG tablet TAKE 1 TABLET DAILY   buPROPion-XL  150 MG  TAKE 1 TABLET  DAILY   VITAMIN D 2000  Takes 5,000 Units daily   CINNAMON 1,000 mg  Take  2 times daily.  estradiol (VIVELLE-DOT) 0.025 MG/24HR APPLY 1 PATCH TWICE A WEEK   ezetimibe  10 MG tablet Take 1 tablet daily for cholesterol   fexofenadine 180 MG tablet Take daily.    FLONASE  nasal spray PLACE 2 SPRAYS INTO BOTH NOSTRILS DAILY.   furosemide 40 MG tablet TAKE 1 TABLET DAILY    levothyroxine 112 MCG tablet Take 1 tablet daily    Magnesium 500 MG CAPS Take daily.   MINIVELLE 0.025 MG/24HR    HAIR/SKIN/NAILS  Take 3 tablets daily   olmesartan 40 MG tablet TAKE 1/2 TO 1 TABLET DAILY    Omega-3 FISH OIL 1000 MG CAPS Take daily.   phentermine 37.5 MG tablet TAKE 1/2 TO 1 TABLET DAILY    rosuvastatin 40 MG tablet TAKE 1/2 TO 1 TABLET DAILY    vitamin E 400 UNIT Take daily.   Zinc 25 MG TABS Take  daily.     Allergies  Allergen Reactions   Erythromycin Nausea And Vomiting and Other (See Comments)    Diarrhea - more severe    Past Medical History:  Diagnosis Date   Anxiety disorder    Cancer (HCC)    thyroid   GERD (gastroesophageal reflux disease)    History of thyroid cancer    Hypertension    Migraine headache    Pseudopapilledema of both optic discs    bilateral   Vitamin D deficiency      Health Maintenance  Topic Date Due   Pneumococcal Vaccine 52-84 Years old (1 - PCV) Never done   Zoster Vaccines-  Shingrix (1 of 2) Never done   PAP SMEAR-Modifier  04/18/2018   MAMMOGRAM  06/02/2019   COVID-19 Vaccine (3 - Pfizer risk series) 08/20/2019   INFLUENZA VACCINE  02/12/2021   TETANUS/TDAP  05/02/2025   Hepatitis C Screening  Completed   HIV Screening  Completed   HPV VACCINES  Aged Out     Immunization History  Administered Date(s) Administered   DTaP 12/14/1994   Hepatitis B 07/15/1989   Influenza-Unspecified 04/29/2017   PFIZER(Purple Top)SARS-COV-2 Vaccination 07/05/2019, 07/23/2019   PPD Test 07/18/2014, 08/04/2015, 10/05/2018, 11/22/2019   Pneumococcal Polysaccharide-23 10/09/1998   Tdap 05/03/2015    Last Colon - 08/11/2017 - Dr Loreta Ave - recommended 10 yr f/u due Feb 2029   Last MGM  & Pap Jan 2021 - Dr Kirtland Bouchard  [facility]   Past Surgical History:  Procedure Laterality Date   ABDOMINAL HYSTERECTOMY  2005   THYROIDECTOMY  04/25/11    Family History  Problem Relation Age of Onset   Diabetes Mother    Heart disease Mother    Hypertension Mother    Hyperlipidemia Mother    COPD Mother    Depression Father    Suicidality Father    Stroke Brother    Heart disease Brother    Asthma Son    Cancer Maternal Aunt        lung    Social History   Tobacco Use   Smoking status: Light Smoker    Packs/day: 0.20    Pack years: 0.00    Types: Cigarettes   Smokeless tobacco: Never   Tobacco comments:    1-2 cigarettes per day  Substance Use Topics   Alcohol use: Yes    Comment: weekends   Drug use: No      ROS Constitutional: Denies fever, chills, weight loss/gain, headaches, insomnia,  night sweats, and change in appetite. Does c/o fatigue. Eyes: Denies redness, blurred vision, diplopia, discharge,  itchy, watery eyes.  ENT: Denies discharge, congestion, post nasal drip, epistaxis, sore throat, earache, hearing loss, dental pain, Tinnitus, Vertigo, Sinus pain, snoring.  Cardio: Denies chest pain, palpitations, irregular heartbeat, syncope, dyspnea,  diaphoresis, orthopnea, PND, claudication, edema Respiratory: denies cough, dyspnea, DOE, pleurisy, hoarseness, laryngitis, wheezing.  Gastrointestinal: Denies dysphagia, heartburn, reflux, water brash, pain, cramps, nausea, vomiting, bloating, diarrhea, constipation, hematemesis, melena, hematochezia, jaundice, hemorrhoids Genitourinary: Denies dysuria, frequency, urgency, nocturia, hesitancy, discharge, hematuria, flank pain Breast: Breast lumps, nipple discharge, bleeding.  Musculoskeletal: Denies arthralgia, myalgia, stiffness, Jt. Swelling, pain, limp, and strain/sprain. Denies falls. Skin: Denies puritis, rash, hives, warts, acne, eczema, changing in skin lesion Neuro: No weakness, tremor, incoordination, spasms, paresthesia, pain Psychiatric: Denies confusion, memory loss, sensory loss. Denies Depression. Endocrine: Denies change in weight, skin, hair change, nocturia, and paresthesia, diabetic polys, visual blurring, hyper / hypo glycemic episodes.  Heme/Lymph: No excessive bleeding, bruising, enlarged lymph nodes.  Physical Exam  BP 136/80   Pulse 88   Temp 97.9 F (36.6 C)   Resp 16   Ht 5' 2.5" (1.588 m)   Wt 185 lb 3.2 oz (84 kg)   SpO2 99%   BMI 33.33 kg/m   General Appearance: Well nourished, well groomed and in no apparent distress.  Eyes: PERRLA, EOMs, conjunctiva no swelling or erythema, normal fundi and vessels. Sinuses: No frontal/maxillary tenderness ENT/Mouth: EACs patent / TMs  nl. Nares clear without erythema, swelling, mucoid exudates. Oral hygiene is good. No erythema, swelling, or exudate. Tongue normal, non-obstructing. Tonsils not swollen or erythematous. Hearing normal.  Neck: Supple, thyroid not palpable. No bruits, nodes or JVD. Respiratory: Respiratory effort normal.  BS equal and clear bilateral without rales, rhonci, wheezing or stridor. Cardio: Heart sounds are normal with regular rate and rhythm and no murmurs, rubs or gallops. Peripheral pulses are  normal and equal bilaterally without edema. No aortic or femoral bruits. Chest: symmetric with normal excursions and percussion. Breasts: Symmetric, without lumps, nipple discharge, retractions, or fibrocystic changes.  Abdomen: Flat, soft with bowel sounds active. Nontender, no guarding, rebound, hernias, masses, or organomegaly.  Lymphatics: Non tender without lymphadenopathy.  Musculoskeletal: Full ROM all peripheral extremities, joint stability, 5/5 strength, and normal gait. Skin: Warm and dry without rashes, lesions, cyanosis, clubbing or  ecchymosis.  Neuro: Cranial nerves intact, reflexes equal bilaterally. Normal muscle tone, no cerebellar symptoms. Sensation intact.  Pysch: Alert and oriented X 3, normal affect, Insight and Judgment appropriate.    Assessment and Plan  1. Annual Preventative Screening Examination   2. Essential hypertension  - EKG 12-Lead - CBC with Differential/Platelet - COMPLETE METABOLIC PANEL WITH GFR - Magnesium - TSH   3. Hyperlipidemia, mixed  - EKG 12-Lead - Lipid panel   4. Abnormal glucose  - EKG 12-Lead - Hemoglobin A1c - Insulin, random   5. Vitamin D deficiency  - VITAMIN D 25 Hydroxy    6. Hypothyroidism  - TSH   7. Screening for colorectal cancer  - POC Hemoccult Bld/Stl   8. FHx: heart disease  - EKG 12-Lead   9. Smoker   - EKG 12-Lead   10. Medication management  - Urinalysis, Routine w reflex microscopic - Microalbumin / creatinine urine ratio - COMPLETE METABOLIC PANEL WITH GFR - Magnesium - Lipid panel - TSH - Hemoglobin A1c - Insulin, random - VITAMIN D 25 Hydroxy            Patient was counseled in prudent diet to achieve/maintain BMI less than 25 for weight control,  BP monitoring, regular exercise and medications. Discussed med's effects and SE's. Screening labs and tests as requested with regular follow-up as recommended. Over 40 minutes of exam, counseling, chart review and high complex  critical decision making was performed.   Marinus Maw, MD

## 2023-02-28 NOTE — Patient Instructions (Signed)

## 2023-03-01 ENCOUNTER — Other Ambulatory Visit (HOSPITAL_COMMUNITY): Payer: Self-pay

## 2023-03-01 ENCOUNTER — Other Ambulatory Visit: Payer: Self-pay

## 2023-03-02 NOTE — Progress Notes (Signed)
^<^<^<^<^<^<^<^<^<^<^<^<^<^<^<^<^<^<^<^<^<^<^<^<^<^<^<^<^<^<^<^<^<^<^<^<^ ^>^>^>^>^>^>^>^>^>^>^>>^>^>^>^>^>^>^>^>^>^>^>^>^>^>^>^>^>^>^>^>^>^>^>^>^>  -Test results slightly outside the reference range are not unusual. If there is anything important, I will review this with you,  otherwise it is considered normal test values.  If you have further questions,  please do not hesitate to contact me at the office or via My Chart.   ^<^<^<^<^<^<^<^<^<^<^<^<^<^<^<^<^<^<^<^<^<^<^<^<^<^<^<^<^<^<^<^<^<^<^<^<^ ^>^>^>^>^>^>^>^>^>^>^>^>^>^>^>^>^>^>^>^>^>^>^>^>^>^>^>^>^>^>^>^>^>^>^>^>^  -  GFR / Kidney functions are decreased a little   -  Very important to drink adequate amounts of fluids to prevent permanent damage    - Recommend drink at least 6 bottles (16 ounces) of fluids /water /day = 96 Oz ~100 oz  - 100 oz = 3,000 cc or 3 liters / day  - >> That's 1 &1/2 bottles of a 2 liter soda bottle /day !    ^<^<^<^<^<^<^<^<^<^<^<^<^<^<^<^<^<^<^<^<^<^<^<^<^<^<^<^<^<^<^<^<^<^<^<^<^ ^>^>^>^>^>^>^>^>^>^>^>^>^>^>^>^>^>^>^>^>^>^>^>^>^>^>^>^>^>^>^>^>^>^>^>^>^  -  A1c = 6.0% - When gets to 6.5 % ,  then  full fleged Diabetic    Blood sugar and A1c are elevated in the borderline and                                                     early or pre-diabetes range which has the same   300% increased risk for heart attack, stroke, cancer and                                       alzheimer- type vascular dementia as full blown diabetes.   But the good news is that diet, exercise with                  weight loss                                                                     can cure the early diabetes at this point.  -    It is very important that you work harder with diet by                                avoiding all foods that are white except chicken, fish & calliflower.  - Avoid white rice  (brown & wild rice is OK),   - Avoid white potatoes  (sweet potatoes in moderation is OK),   White bread  or wheat bread or anything made out of   white flour like bagels, donuts, rolls, buns, biscuits, cakes,  - pastries, cookies, pizza crust, and pasta (made from  white flour & egg whites)   - vegetarian pasta or spinach or wheat pasta is OK.  - Multigrain breads like Arnold's, Pepperidge Farm or                                                     multigrain sandwich thins or high fiber breads like   E[place]ka bread  or "Dave's Killer" breads that are 4 to 5 grams fiber per slice !  are best.    Diet, exercise and weight loss can reverse and cure diabetes in the early stages.    - Diet, exercise and weight loss is very important in the   control and prevention of complications of diabetes which   affects every system in your body, ie.   -Brain - dementia/stroke,  - eyes - glaucoma/blindness,  - heart - heart attack/heart failure,  - kidneys - dialysis,  - stomach - gastric paralysis,  - intestines - malabsorption,  - nerves - severe painful neuritis,  - circulation - gangrene & loss of a leg(s)  - and finally  . . . . . . . . . . . . . . . . . .    - cancer and Alzheimers.  ^<^<^<^<^<^<^<^<^<^<^<^<^<^<^<^<^<^<^<^<^<^<^<^<^<^<^<^<^<^<^<^<^<^<^<^<^ ^>^>^>^>^>^>^>^>^>^>^>^>^>^>^>^>^>^>^>^>^>^>^>^>^>^>^>^>^>^>^>^>^>^>^>^>^  -  Chol = 172  Excellent   - Very low risk for Heart Attack  / Stroke  ^>^>^>^>^>^>^>^>^>^>^>^>^>^>^>^>^>^>^>^>^>^>^>^>^>^>^>^>^>^>^>^>^>^>^>^>^ ^>^>^>^>^>^>^>^>^>^>^>^>^>^>^>^>^>^>^>^>^>^>^>^>^>^>^>^>^>^>^>^>^>^>^>^>^  -  Vitamin D = 92 - Excellent     -   Please keep dosage  same   ^<^<^<^<^<^<^<^<^<^<^<^<^<^<^<^<^<^<^<^<^<^<^<^<^<^<^<^<^<^<^<^<^<^<^<^<^ ^>^>^>^>^>^>^>^>^>^>^>^>^>^>^>^>^>^>^>^>^>^>^>^>^>^>^>^>^>^>^>^>^>^>^>^>^  -  All Else - CBC - Kidneys - Electrolytes - Liver - Magnesium & Thyroid    - all  Normal /  OK  ^<^<^<^<^<^<^<^<^<^<^<^<^<^<^<^<^<^<^<^<^<^<^<^<^<^<^<^<^<^<^<^<^<^<^<^<^ ^>^>^>^>^>^>^>^>^>^>^>^>^>^>^>^>^>^>^>^>^>^>^>^>^>^>^>^>^>^>^>^>^>^>^>^>^

## 2023-03-03 ENCOUNTER — Other Ambulatory Visit: Payer: Self-pay

## 2023-03-03 ENCOUNTER — Other Ambulatory Visit (HOSPITAL_COMMUNITY): Payer: Self-pay

## 2023-03-03 LAB — COMPLETE METABOLIC PANEL WITH GFR
AG Ratio: 1.3 (calc) (ref 1.0–2.5)
ALT: 22 U/L (ref 6–29)
AST: 17 U/L (ref 10–35)
Albumin: 4.3 g/dL (ref 3.6–5.1)
Alkaline phosphatase (APISO): 91 U/L (ref 37–153)
BUN/Creatinine Ratio: 14 (calc) (ref 6–22)
BUN: 16 mg/dL (ref 7–25)
CO2: 28 mmol/L (ref 20–32)
Calcium: 9.2 mg/dL (ref 8.6–10.4)
Chloride: 104 mmol/L (ref 98–110)
Creat: 1.11 mg/dL — ABNORMAL HIGH (ref 0.50–1.05)
Globulin: 3.2 g/dL (ref 1.9–3.7)
Glucose, Bld: 106 mg/dL — ABNORMAL HIGH (ref 65–99)
Potassium: 4.4 mmol/L (ref 3.5–5.3)
Sodium: 140 mmol/L (ref 135–146)
Total Bilirubin: 0.4 mg/dL (ref 0.2–1.2)
Total Protein: 7.5 g/dL (ref 6.1–8.1)
eGFR: 55 mL/min/{1.73_m2} — ABNORMAL LOW (ref >=60)

## 2023-03-03 LAB — CBC WITH DIFFERENTIAL/PLATELET
Absolute Monocytes: 496 {cells}/uL (ref 200–950)
Basophils Absolute: 62 {cells}/uL (ref 0–200)
Basophils Relative: 1 %
Eosinophils Absolute: 81 {cells}/uL (ref 15–500)
Eosinophils Relative: 1.3 %
HCT: 41 % (ref 35.0–45.0)
Hemoglobin: 13.6 g/dL (ref 11.7–15.5)
Lymphs Abs: 1283 {cells}/uL (ref 850–3900)
MCH: 30.9 pg (ref 27.0–33.0)
MCHC: 33.2 g/dL (ref 32.0–36.0)
MCV: 93.2 fL (ref 80.0–100.0)
MPV: 11.8 fL (ref 7.5–12.5)
Monocytes Relative: 8 %
Neutro Abs: 4278 {cells}/uL (ref 1500–7800)
Neutrophils Relative %: 69 %
Platelets: 343 10*3/uL (ref 140–400)
RBC: 4.4 10*6/uL (ref 3.80–5.10)
RDW: 13.1 % (ref 11.0–15.0)
Total Lymphocyte: 20.7 %
WBC: 6.2 10*3/uL (ref 3.8–10.8)

## 2023-03-03 LAB — MICROALBUMIN / CREATININE URINE RATIO
Creatinine, Urine: 21 mg/dL (ref 20–275)
Microalb, Ur: <0.2 mg/dL

## 2023-03-03 LAB — URINALYSIS, ROUTINE W REFLEX MICROSCOPIC
Bilirubin Urine: NEGATIVE
Glucose, UA: NEGATIVE
Hgb urine dipstick: NEGATIVE
Ketones, ur: NEGATIVE
Leukocytes,Ua: NEGATIVE
Nitrite: NEGATIVE
Protein, ur: NEGATIVE
Specific Gravity, Urine: 1.007 (ref 1.001–1.035)
pH: 5.5 (ref 5.0–8.0)

## 2023-03-03 LAB — VITAMIN D 25 HYDROXY (VIT D DEFICIENCY, FRACTURES): Vit D, 25-Hydroxy: 92 ng/mL (ref 30–100)

## 2023-03-03 LAB — LIPID PANEL
Cholesterol: 172 mg/dL (ref <200)
HDL: 63 mg/dL (ref >=50)
LDL Cholesterol (Calc): 90 mg/dL
Non-HDL Cholesterol (Calc): 109 mg/dL (calc) (ref <130)
Total CHOL/HDL Ratio: 2.7 (calc) (ref <5.0)
Triglycerides: 91 mg/dL (ref <150)

## 2023-03-03 LAB — MAGNESIUM: Magnesium: 2.5 mg/dL (ref 1.5–2.5)

## 2023-03-03 LAB — HEMOGLOBIN A1C
Hgb A1c MFr Bld: 6 %{Hb} — ABNORMAL HIGH (ref <5.7)
Mean Plasma Glucose: 126 mg/dL
eAG (mmol/L): 7 mmol/L

## 2023-03-03 LAB — TSH: TSH: 0.83 m[IU]/L (ref 0.40–4.50)

## 2023-03-03 LAB — INSULIN, RANDOM: Insulin: 10.8 u[IU]/mL

## 2023-03-04 ENCOUNTER — Other Ambulatory Visit (HOSPITAL_COMMUNITY): Payer: Self-pay

## 2023-03-05 ENCOUNTER — Other Ambulatory Visit (HOSPITAL_COMMUNITY): Payer: Self-pay

## 2023-04-07 ENCOUNTER — Other Ambulatory Visit (HOSPITAL_COMMUNITY): Payer: Self-pay

## 2023-04-24 ENCOUNTER — Other Ambulatory Visit (HOSPITAL_COMMUNITY): Payer: Self-pay

## 2023-06-03 ENCOUNTER — Encounter: Payer: Self-pay | Admitting: Nurse Practitioner

## 2023-06-03 ENCOUNTER — Ambulatory Visit: Payer: Federal, State, Local not specified - PPO | Admitting: Nurse Practitioner

## 2023-06-03 VITALS — BP 112/70 | HR 68 | Temp 97.5°F | Ht 62.5 in | Wt 185.2 lb

## 2023-06-03 DIAGNOSIS — Z23 Encounter for immunization: Secondary | ICD-10-CM | POA: Diagnosis not present

## 2023-06-03 DIAGNOSIS — R7309 Other abnormal glucose: Secondary | ICD-10-CM | POA: Diagnosis not present

## 2023-06-03 DIAGNOSIS — Z85850 Personal history of malignant neoplasm of thyroid: Secondary | ICD-10-CM

## 2023-06-03 DIAGNOSIS — Z79899 Other long term (current) drug therapy: Secondary | ICD-10-CM

## 2023-06-03 DIAGNOSIS — E890 Postprocedural hypothyroidism: Secondary | ICD-10-CM

## 2023-06-03 DIAGNOSIS — E782 Mixed hyperlipidemia: Secondary | ICD-10-CM

## 2023-06-03 DIAGNOSIS — I10 Essential (primary) hypertension: Secondary | ICD-10-CM

## 2023-06-03 DIAGNOSIS — E559 Vitamin D deficiency, unspecified: Secondary | ICD-10-CM

## 2023-06-03 DIAGNOSIS — Z720 Tobacco use: Secondary | ICD-10-CM

## 2023-06-03 NOTE — Patient Instructions (Signed)
Smoking Tobacco Information, Adult Smoking tobacco can be harmful to your health. Tobacco contains a toxic colorless chemical called nicotine. Nicotine causes changes in your brain that make you want more and more. This is called addiction. This can make it hard to stop smoking once you start. Tobacco also has other toxic chemicals that can hurt your body and raise your risk of many cancers. Menthol or "lite" tobacco or cigarette brands are not safer than regular brands. How can smoking tobacco affect me? Smoking tobacco puts you at risk for: Cancer. Smoking is most commonly associated with lung cancer, but can also lead to cancer in other parts of the body. Chronic obstructive pulmonary disease (COPD). This is a long-term lung condition that makes it hard to breathe. It also gets worse over time. High blood pressure (hypertension), heart disease, stroke, heart attack, and lung infections, such as pneumonia. Cataracts. This is when the lenses in the eyes become clouded. Digestive problems. This may include peptic ulcers, heartburn, and gastroesophageal reflux disease (GERD). Oral health problems, such as gum disease, mouth sores, and tooth loss. Loss of taste and smell. Smoking also affects how you look and smell. Smoking may cause: Wrinkles. Yellow or stained teeth, fingers, and fingernails. Bad breath. Bad-smelling clothes and hair. Smoking tobacco can also affect your social life, because: It may be challenging to find places to smoke when away from home. Many workplaces, [employer], hotels, and public places are tobacco-free. Smoking is expensive. This is due to the cost of tobacco and the long-term costs of treating health problems from smoking. Secondhand smoke may affect those around you. Secondhand smoke can cause lung cancer, breathing problems, and heart disease. Children of smokers have a higher risk for: Sudden infant death syndrome (SIDS). Ear infections. Lung infections. What  actions can I take to prevent health problems? Quit smoking  Do not start smoking. Quit if you already smoke. Do not replace cigarette smoking with vaping devices, such as e-cigarettes. Make a plan to quit smoking and commit to it. Look for programs to help you, and ask your health care provider for recommendations and ideas. Set a date and write down all the reasons you want to quit. Let your friends and family know you are quitting so they can help and support you. Consider finding friends who also want to quit. It can be easier to quit with someone else, so that you can support each other. Talk with your health care provider about using nicotine replacement medicines to help you quit. These include gum, lozenges, patches, sprays, or pills. If you try to quit but return to smoking, stay positive. It is common to slip up when you first quit, so take it one day at a time. Be prepared for cravings. When you feel the urge to smoke, chew gum or suck on hard candy. Lifestyle Stay busy. Take care of your body. Get plenty of exercise, eat a healthy diet, and drink plenty of water. Find ways to manage your stress, such as meditation, yoga, exercise, or time spent with friends and family. Ask your health care provider about having regular tests (screenings) to check for cancer. This may include blood tests, imaging tests, and other tests. Where to find support To get support to quit smoking, consider: Asking your health care provider for more information and resources. Joining a support group for people who want to quit smoking in your local community. There are many effective programs that may help you to quit. Calling the smokefree.gov counselor  helpline at 1-800-QUIT-NOW 361-233-6675). Where to find more information You may find more information about quitting smoking from: Centers for Disease Control and Prevention: http://www.osborne.com/ BankR[employer]: smokefree.gov American Lung Association:  freedomfromsmoking.org Contact a health care provider if: You have problems breathing. Your lips, nose, or fingers turn blue. You have chest pain. You are coughing up blood. You feel like you will faint. You have other health changes that cause you to worry. Summary Smoking tobacco can negatively affect your health, the health of those around you, your finances, and your social life. Do not start smoking. Quit if you already smoke. If you need help quitting, ask your health care provider. Consider joining a support group for people in your local community who want to quit smoking. There are many effective programs that may help you to quit. This information is not intended to replace advice given to you by your health care provider. Make sure you discuss any questions you have with your health care provider. Document Revised: 06/26/2021 Document Reviewed: 06/26/2021 Elsevier Patient Education  2024 Elsevier Inc.   Healthy Eating, Adult Healthy eating may help you get and keep a healthy body weight, reduce the risk of chronic disease, and live a long and productive life. It is important to follow a healthy eating pattern. Your nutritional and calorie needs should be met mainly by different nutrient-rich foods. What are tips for following this plan? Reading food labels Read labels and choose the following: Reduced or low sodium products. Juices with 100% fruit juice. Foods with low saturated fats (<3 g per serving) and high polyunsaturated and monounsaturated fats. Foods with whole grains, such as whole wheat, cracked wheat, brown rice, and wild rice. Whole grains that are fortified with folic acid. This is recommended for females who are pregnant or who want to become pregnant. Read labels and do not eat or drink the following: Foods or drinks with added sugars. These include foods that contain brown sugar, corn sweetener, corn syrup, dextrose, fructose, glucose, high-fructose corn syrup,  honey, invert sugar, lactose, malt syrup, maltose, molasses, raw sugar, sucrose, trehalose, or turbinado sugar. Limit your intake of added sugars to less than 10% of your total daily calories. Do not eat more than the following amounts of added sugar per day: 6 teaspoons (25 g) for females. 9 teaspoons (38 g) for males. Foods that contain processed or refined starches and grains. Refined grain products, such as white flour, degermed cornmeal, white bread, and white rice. Shopping Choose nutrient-rich snacks, such as vegetables, whole fruits, and nuts. Avoid high-calorie and high-sugar snacks, such as potato chips, fruit snacks, and candy. Use oil-based dressings and spreads on foods instead of solid fats such as butter, margarine, sour cream, or cream cheese. Limit pre-made sauces, mixes, and "instant" products such as flavored rice, instant noodles, and ready-made pasta. Try more plant-protein sources, such as tofu, tempeh, black beans, edamame, lentils, nuts, and seeds. Explore eating plans such as the Mediterranean diet or vegetarian diet. Try heart-healthy dips made with beans and healthy fats like hummus and guacamole. Vegetables go great with these. Cooking Use oil to saut or stir-fry foods instead of solid fats such as butter, margarine, or lard. Try baking, boiling, grilling, or broiling instead of frying. Remove the fatty part of meats before cooking. Steam vegetables in water or broth. Meal planning  At meals, imagine dividing your plate into fourths: One-half of your plate is fruits and vegetables. One-fourth of your plate is whole grains. One-fourth of your plate is  protein, especially lean meats, poultry, eggs, tofu, beans, or nuts. Include low-fat dairy as part of your daily diet. Lifestyle Choose healthy options in all settings, including home, work, school, restaurants, or stores. Prepare your food safely: Wash your hands after handling raw meats. Where you prepare food,  keep surfaces clean by regularly washing with hot, soapy water. Keep raw meats separate from ready-to-eat foods, such as fruits and vegetables. Cook seafood, meat, poultry, and eggs to the recommended temperature. Get a food thermometer. Store foods at safe temperatures. In general: Keep cold foods at 59F (4.4C) or below. Keep hot foods at 159F (60C) or above. Keep your freezer at [facility] (-17.8C) or below. Foods are not safe to eat if they have been between the temperatures of 40-159F (4.4-60C) for more than 2 hours. What foods should I eat? Fruits Aim to eat 1-2 cups of fresh, canned (in natural juice), or frozen fruits each day. One cup of fruit equals 1 small apple, 1 large banana, 8 large strawberries, 1 cup (237 g) canned fruit,  cup (82 g) dried fruit, or 1 cup (240 mL) 100% juice. Vegetables Aim to eat 2-4 cups of fresh and frozen vegetables each day, including different varieties and colors. One cup of vegetables equals 1 cup (91 g) broccoli or cauliflower florets, 2 medium carrots, 2 cups (150 g) raw, leafy greens, 1 large tomato, 1 large bell pepper, 1 large sweet potato, or 1 medium white potato. Grains Aim to eat 5-10 ounce-equivalents of whole grains each day. Examples of 1 ounce-equivalent of grains include 1 slice of bread, 1 cup (40 g) ready-to-eat cereal, 3 cups (24 g) popcorn, or  cup (93 g) cooked rice. Meats and other proteins Try to eat 5-7 ounce-equivalents of protein each day. Examples of 1 ounce-equivalent of protein include 1 egg,  oz nuts (12 almonds, 24 pistachios, or 7 walnut halves), 1/4 cup (90 g) cooked beans, 6 tablespoons (90 g) hummus or 1 tablespoon (16 g) peanut butter. A cut of meat or fish that is the size of a deck of cards is about 3-4 ounce-equivalents (85 g). Of the protein you eat each week, try to have at least 8 sounce (227 g) of seafood. This is about 2 servings per week. This includes salmon, trout, herring, sardines, and anchovies. Dairy Aim  to eat 3 cup-equivalents of fat-free or low-fat dairy each day. Examples of 1 cup-equivalent of dairy include 1 cup (240 mL) milk, 8 ounces (250 g) yogurt, 1 ounces (44 g) natural cheese, or 1 cup (240 mL) fortified soy milk. Fats and oils Aim for about 5 teaspoons (21 g) of fats and oils per day. Choose monounsaturated fats, such as canola and olive oils, mayonnaise made with olive oil or avocado oil, avocados, peanut butter, and most nuts, or polyunsaturated fats, such as sunflower, corn, and soybean oils, walnuts, pine nuts, sesame seeds, sunflower seeds, and flaxseed. Beverages Aim for 6 eight-ounce glasses of water per day. Limit coffee to 3-5 eight-ounce cups per day. Limit caffeinated beverages that have added calories, such as soda and energy drinks. If you drink alcohol: Limit how much you have to: 0-1 drink a day if you are female. 0-2 drinks a day if you are female. Know how much alcohol is in your drink. In the [place] one drink is one 12 oz bottle of beer (355 mL), one 5 oz glass of wine (148 mL), or one 1 oz glass of hard liquor (44 mL). Seasoning and other foods Try not  to add too much salt to your food. Try using herbs and spices instead of salt. Try not to add sugar to food. This information is based on [place] nutrition guidelines. To learn more, visit DisposableNylon.be. Exact amounts may vary. You may need different amounts. This information is not intended to replace advice given to you by your health care provider. Make sure you discuss any questions you have with your health care provider. Document Revised: 04/01/2022 Document Reviewed: 04/01/2022 Elsevier Patient Education  2024 [employer].

## 2023-06-03 NOTE — Progress Notes (Signed)
FOLLOW UP 3 MONTH  Assessment and Plan:   Hypertension Continue medications- ASA, Atenolol, Furosemide, Olmesartan Monitor blood pressure at home; patient to call if consistently greater than 130/80 Continue DASH diet - emphasized low sodium and increasing fluid intake  Reminder to go to the ER if any CP, SOB, nausea, dizziness, severe HA, changes vision/speech, left arm numbness and tingling and jaw pain.  Cholesterol Continue Rosuvastatin Discussed lifestyle modifications. Recommended diet heavy in fruits and veggies, omega 3's. Decrease consumption of animal meats, cheeses, and dairy products. Remain active and exercise as tolerated. Continue to monitor. Check lipids/TSH  Prediabetes Education: Reviewed 'ABCs' of diabetes management  Discussed goals to be met and/or maintained include A1C (<7) Blood pressure (<130/80) Cholesterol (LDL <70) Continue Eye Exam yearly  Continue Dental Exam Q6 mo Discussed dietary recommendations Discussed Physical Activity recommendations Check A1C  Hypothyroidism/ hx of thyroid cancer s/p resection on suppression therapy  Controlled TSH Continue Levothyroxine. Reminded to take on an empty stomach 30-45mins before food.  Stop any Biotin Supplement 48-72 hours before next TSH level to reduce the risk of falsely low TSH levels. Updated thyroid US to assess for and changes to probable recurrent thyroid tissue.  Vitamin D deficiency Continue supplement for goal of 60-100 Monitor Vitamin D levels  Tobacco use Discussed risks associated with tobacco use and advised to reduce or quit - smoking cessation. Patient is not ready to do so, but advised to consider strongly Will follow up at the next visit  Medication management All medications discussed and reviewed in full. All questions and concerns regarding medications addressed.    Orders Placed This Encounter  Procedures   US THYROID    Hx of thyroid cancer (06/2011)    Standing Status:    Future    Standing Expiration Date:   06/02/2024    Order Specific Question:   Reason for Exam (SYMPTOM  OR DIAGNOSIS REQUIRED)    Answer:   Post thyroidectomy -Assess probable recurrent thyroid tissue in the left resection bed measuring 1.7 x 0.4 x 0.8 cm 03/2021    Order Specific Question:   Preferred imaging location?    Answer:   [id number]   Flu vaccine HIGH DOSE PF(Fluzone Trivalent)   CBC with Differential/Platelet   COMPLETE METABOLIC PANEL WITH GFR   Lipid panel   Hemoglobin A1c   Notify office for further evaluation and treatment, questions or concerns if any reported s/s fail to improve.   The patient was advised to call back or seek an in-person evaluation if any symptoms worsen or if the condition fails to improve as anticipated.   Further disposition pending results of labs. Discussed med's effects and SE's.    I discussed the assessment and treatment plan with the patient. The patient was provided an opportunity to ask questions and all were answered. The patient agreed with the plan and demonstrated an understanding of the instructions.  Discussed med's effects and SE's. Screening labs and tests as requested with regular follow-up as recommended.  I provided 30 minutes of face-to-face time during this encounter including counseling, chart review, and critical decision making was preformed.   Future Appointments  Date Time Provider Department Center  09/04/2023 11:30 AM Lucky Cowboy, MD [facility] None  12/04/2023 11:30 AM Adela Glimpse, NP [facility] None  03/09/2024 11:00 AM Lucky Cowboy, MD [facility] None    ----------------------------------------------------------------------------------------------------------------------  HPI 67 y.o. female  presents for 3 month follow up on HTN, HLD, history of prediabetes, post surgical hypothyroid (  following thyroid cancer) and vitamin D deficiency.  Overall she reports feeling well.    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 Last Eval at [facility] discounted that Dx with a Dx/o anomalous Optic Discs and not indeed papilloedema. Has been released by neurology and only follow up if needed.   she currently continues to smoke 0-2 cigarettes a day; discussed risks associated with smoking, patient is not ready to quit.      BMI is Body mass index is 33.33 kg/m., she has been working on diet and exercise.  Wt Readings from Last 3 Encounters:  06/03/23 185 lb 3.2 oz (84 kg)  02/28/23 185 lb 3.2 oz (84 kg)  11/26/22 192 lb 6.4 oz (87.3 kg)   Her blood pressure has been controlled at home, taking atenolol 50 mg BID lasix 20 mg, daily, olmesartan 20 mg, today their BP is BP: 112/70  She reports BP at home range from 120-140/70s.   She does not workout. She denies chest pain, shortness of breath, dizziness, fatigue.    She is on cholesterol medication rosuvastatin and denies myalgias. Her cholesterol is not at goal. The cholesterol last visit was:   Lab Results  Component Value Date   CHOL 172 02/28/2023   HDL 63 02/28/2023   LDLCALC 90 02/28/2023   TRIG 91 02/28/2023   CHOLHDL 2.7 02/28/2023    She has been working on diet and exercise for history of prediabetes, and denies increased appetite, nausea, paresthesia of the feet, polydipsia, polyuria, visual disturbances and vomiting. Last A1C in the office was:  Lab Results  Component Value Date   HGBA1C 6.0 (H) 02/28/2023   In 2012, patient had a subtotal thyroidectomy for a MNG and was found to have a small foci of Thyroid cancer. She has been on supressive therapy since. She had a follow up US 03/2021 that revealed a probable recurrent thyroid tissue in the left resection bed measuring 1.7 x 0.4 x 0.8 cm. No microcalcifications, nodularity or other suspicious features.  She has not followed up since.    Her medication was  not changed last visit.   Lab Results  Component Value Date   TSH 0.83 02/28/2023  . Patient is on Vitamin D supplement (5000 IU daily) and at goal:    Lab Results  Component Value Date   VD25OH 92 02/28/2023       Current Medications:  Current Outpatient Medications on File Prior to Visit  Medication Sig   aspirin 81 MG tablet Take 81 mg by mouth daily.   atenolol (TENORMIN) 100 MG tablet Take 1 tablet by mouth daily for blood pressure.   buPROPion (WELLBUTRIN XL) 150 MG 24 hr tablet Take 1 tablet (150 mg total) by mouth every morning for mood, focus and concentration.   Cholecalciferol (VITAMIN D) 125 MCG (5000 UT) CAPS Take 1 capsule Daily   cycloSPORINE (RESTASIS) 0.05 % ophthalmic emulsion Place 1 drop into both eyes 2 (two) times daily.   dexamethasone (DECADRON) 4 MG tablet Take 1 tablet by mouth 3 times a day for 3 days, then take 1 tablet by mouth 2 times a day for 3 days, then take 1 tablet by mouth daily as directed.   estradiol (VIVELLE-DOT) 0.025 MG/24HR APPLY 1 PATCH TWICE A WEEK   estradiol (VIVELLE-DOT) 0.025 MG/24HR Place 1 patch onto the skin 2 (two) times a week.  fexofenadine (ALLEGRA) 180 MG tablet Take 180 mg by mouth daily. Patient OTC Allegra PRN   fluticasone (FLONASE) 50 MCG/ACT nasal spray PLACE 2 SPRAYS INTO BOTH NOSTRILS DAILY.   furosemide (LASIX) 40 MG tablet Take 1 tablet (40 mg total) by mouth daily. For blood pressure and fluid retention.   levothyroxine (SYNTHROID) 112 MCG tablet Take 1 tablet (112 mcg total) by mouth daily on an empty stomach with only water for 30 minutes and no antacids, calcium or magnesium meds for 4 hours and avoid biotin   Magnesium 500 MG CAPS Take by mouth daily.   olmesartan (BENICAR) 40 MG tablet Take 1 tablet by mouth daily for blood pressure.   Omega-3 Fatty Acids (FISH OIL) 1000 MG CAPS Take by mouth daily.   phentermine (ADIPEX-P) 37.5 MG tablet Take 0.5-1 tablets (18.75-37.5 mg total) by mouth every morning for dieting  & weight loss   RESTASIS 0.05 % ophthalmic emulsion Instill 1 drop in both eyes twice a day.   rosuvastatin (CRESTOR) 40 MG tablet Take  1/2 tablet   2 x /week  for Cholesterol   topiramate (TOPAMAX) 50 MG tablet Take 0.5-1 tablets (25-50 mg total) by mouth 2 (two) times daily at suppertime & bedtime for dieting & weight loss   triamcinolone cream (KENALOG) 0.1 % Apply 1 application topically 2 to 3 times daily.   vitamin C (ASCORBIC ACID) 500 MG tablet Take 500 mg by mouth daily.   vitamin E 400 UNIT capsule Take 400 Units by mouth daily.   Zinc 25 MG TABS Take by mouth daily.   No current facility-administered medications on file prior to visit.     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  HENT:  Negative for hearing loss and tinnitus.   Eyes:  Negative for blurred vision and double vision.  Respiratory:  Negative for cough, shortness of breath and wheezing.   Cardiovascular:  Negative for chest pain, palpitations, orthopnea, claudication and leg swelling.  Gastrointestinal:  Negative for abdominal pain, blood in stool, constipation, diarrhea, heartburn, melena, nausea and vomiting.  Genitourinary: Negative.   Musculoskeletal:  Negative for joint pain and myalgias.  Skin:  Negative for rash.  Neurological:  Negative for dizziness, tingling, sensory change,  weakness and headaches.  Endo/Heme/Allergies:  Negative for polydipsia.  Psychiatric/Behavioral: Negative.    All other systems reviewed and are negative.   Physical Exam: BP 112/70   Pulse 68   Temp (!) 97.5 F (36.4 C)   Ht 5' 2.5" (1.588 m)   Wt 185 lb 3.2 oz (84 kg)   SpO2 99%   BMI 33.33 kg/m  Wt Readings from Last 3 Encounters:  06/03/23 185 lb 3.2 oz (84 kg)  02/28/23 185 lb 3.2 oz (84 kg)  11/26/22 192 lb 6.4 oz (87.3 kg)  General Appearance: Well nourished, in no apparent distress. Eyes: PERRLA, EOMs, conjunctiva no swelling or erythema Sinuses: No Frontal/maxillary tenderness ENT/Mouth: Ext aud canals clear, TMs without erythema, bulging. No erythema, swelling, or exudate on post pharynx.  Tonsils not swollen or erythematous. Hearing normal.  Neck: Supple, thyroid normal. Respiratory: Respiratory effort normal, BS equal bilaterally without rales, rhonchi, wheezing or stridor.  Cardio: RRR with no MRGs. Brisk peripheral pulses without edema.  Abdomen: Soft, + BS.  Non tender, no guarding, rebound, hernias, masses. Lymphatics: Non tender without lymphadenopathy.  Musculoskeletal: Full ROM, 5/5 strength, Normal gait Skin: Left ear with well healed skin tear - see photo below.  Warm, dry without rashes, lesions, ecchymosis.  Neuro: Cranial nerves intact. No cerebellar symptoms.  Psych: Awake and oriented X 3, normal affect, Insight and Judgment appropriate.    Adela Glimpse, NP 12:28 PM [facility]

## 2023-06-04 LAB — CBC WITH DIFFERENTIAL/PLATELET
Absolute Lymphocytes: 1410 {cells}/uL (ref 850–3900)
Absolute Monocytes: 350 {cells}/uL (ref 200–950)
Basophils Absolute: 40 {cells}/uL (ref 0–200)
Basophils Relative: 0.8 %
Eosinophils Absolute: 140 {cells}/uL (ref 15–500)
Eosinophils Relative: 2.8 %
HCT: 41.2 % (ref 35.0–45.0)
Hemoglobin: 13.6 g/dL (ref 11.7–15.5)
MCH: 31.3 pg (ref 27.0–33.0)
MCHC: 33 g/dL (ref 32.0–36.0)
MCV: 94.7 fL (ref 80.0–100.0)
MPV: 11.7 fL (ref 7.5–12.5)
Monocytes Relative: 7 %
Neutro Abs: 3060 {cells}/uL (ref 1500–7800)
Neutrophils Relative %: 61.2 %
Platelets: 287 10*3/uL (ref 140–400)
RBC: 4.35 10*6/uL (ref 3.80–5.10)
RDW: 12.7 % (ref 11.0–15.0)
Total Lymphocyte: 28.2 %
WBC: 5 10*3/uL (ref 3.8–10.8)

## 2023-06-04 LAB — COMPLETE METABOLIC PANEL WITH GFR
AG Ratio: 1.5 (calc) (ref 1.0–2.5)
ALT: 25 U/L (ref 6–29)
AST: 23 U/L (ref 10–35)
Albumin: 4.3 g/dL (ref 3.6–5.1)
Alkaline phosphatase (APISO): 87 U/L (ref 37–153)
BUN/Creatinine Ratio: 14 (calc) (ref 6–22)
BUN: 15 mg/dL (ref 7–25)
CO2: 33 mmol/L — ABNORMAL HIGH (ref 20–32)
Calcium: 9.2 mg/dL (ref 8.6–10.4)
Chloride: 100 mmol/L (ref 98–110)
Creat: 1.07 mg/dL — ABNORMAL HIGH (ref 0.50–1.05)
Globulin: 2.8 g/dL (ref 1.9–3.7)
Glucose, Bld: 94 mg/dL (ref 65–99)
Potassium: 4.5 mmol/L (ref 3.5–5.3)
Sodium: 139 mmol/L (ref 135–146)
Total Bilirubin: 0.5 mg/dL (ref 0.2–1.2)
Total Protein: 7.1 g/dL (ref 6.1–8.1)
eGFR: 57 mL/min/{1.73_m2} — ABNORMAL LOW (ref >=60)

## 2023-06-04 LAB — LIPID PANEL
Cholesterol: 213 mg/dL — ABNORMAL HIGH (ref <200)
HDL: 68 mg/dL (ref >=50)
LDL Cholesterol (Calc): 127 mg/dL — ABNORMAL HIGH
Non-HDL Cholesterol (Calc): 145 mg/dL — ABNORMAL HIGH (ref <130)
Total CHOL/HDL Ratio: 3.1 (calc) (ref <5.0)
Triglycerides: 85 mg/dL (ref <150)

## 2023-06-04 LAB — HEMOGLOBIN A1C
Hgb A1c MFr Bld: 5.8 %{Hb} — ABNORMAL HIGH (ref <5.7)
Mean Plasma Glucose: 120 mg/dL
eAG (mmol/L): 6.6 mmol/L

## 2023-06-05 ENCOUNTER — Other Ambulatory Visit (HOSPITAL_COMMUNITY): Payer: Self-pay

## 2023-06-06 ENCOUNTER — Ambulatory Visit
Admission: RE | Admit: 2023-06-06 | Discharge: 2023-06-06 | Disposition: A | Discharge status: Home / Self Care | Payer: PRIVATE HEALTH INSURANCE | Source: Ambulatory Visit | Attending: Nurse Practitioner | Admitting: Nurse Practitioner

## 2023-06-06 DIAGNOSIS — Z85850 Personal history of malignant neoplasm of thyroid: Secondary | ICD-10-CM

## 2023-06-06 DIAGNOSIS — E890 Postprocedural hypothyroidism: Secondary | ICD-10-CM

## 2023-08-10 ENCOUNTER — Other Ambulatory Visit: Payer: Self-pay | Admitting: Nurse Practitioner

## 2023-08-10 ENCOUNTER — Other Ambulatory Visit (HOSPITAL_COMMUNITY): Payer: Self-pay

## 2023-08-10 ENCOUNTER — Other Ambulatory Visit: Payer: Self-pay | Admitting: Internal Medicine

## 2023-08-11 ENCOUNTER — Other Ambulatory Visit: Payer: Self-pay

## 2023-08-11 ENCOUNTER — Other Ambulatory Visit (HOSPITAL_COMMUNITY): Payer: Self-pay

## 2023-08-11 MED ORDER — LEVOTHYROXINE SODIUM 112 MCG PO TABS
112.0000 ug | ORAL_TABLET | Freq: Every day | ORAL | 3 refills | Status: AC
  Filled 2023-08-11: qty 90, 90d supply, fill #0
  Filled 2023-11-30: qty 30, 30d supply, fill #1
  Filled 2024-01-15: qty 30, 30d supply, fill #2
  Filled 2024-02-27: qty 30, 30d supply, fill #3
  Filled 2024-04-05: qty 30, 30d supply, fill #4
  Filled 2024-05-01: qty 30, 30d supply, fill #5
  Filled 2024-06-18: qty 30, 30d supply, fill #6
  Filled 2024-08-02: qty 30, 30d supply, fill #7

## 2023-08-12 ENCOUNTER — Other Ambulatory Visit (HOSPITAL_COMMUNITY): Payer: Self-pay

## 2023-09-04 ENCOUNTER — Ambulatory Visit: Payer: Federal, State, Local not specified - PPO | Admitting: Internal Medicine

## 2023-09-23 ENCOUNTER — Other Ambulatory Visit (HOSPITAL_COMMUNITY): Payer: Self-pay

## 2023-09-26 ENCOUNTER — Other Ambulatory Visit: Payer: Self-pay

## 2023-09-29 ENCOUNTER — Other Ambulatory Visit (HOSPITAL_COMMUNITY): Payer: Self-pay

## 2023-10-02 ENCOUNTER — Other Ambulatory Visit (HOSPITAL_COMMUNITY): Payer: Self-pay

## 2023-10-02 LAB — HM MAMMOGRAPHY

## 2023-10-02 MED ORDER — ESTRADIOL 0.025 MG/24HR TD PTTW
1.0000 | MEDICATED_PATCH | TRANSDERMAL | 4 refills | Status: AC
  Filled 2023-10-02: qty 24, 84d supply, fill #0
  Filled 2024-02-27 – 2024-03-17 (×2): qty 24, 84d supply, fill #1
  Filled 2024-06-18: qty 24, 84d supply, fill #2

## 2023-10-14 ENCOUNTER — Other Ambulatory Visit (HOSPITAL_COMMUNITY): Payer: Self-pay

## 2023-10-14 MED ORDER — FUROSEMIDE 40 MG PO TABS
40.0000 mg | ORAL_TABLET | Freq: Every day | ORAL | 0 refills | Status: DC
  Filled 2023-10-14: qty 30, 30d supply, fill #0

## 2023-10-15 ENCOUNTER — Other Ambulatory Visit (HOSPITAL_COMMUNITY): Payer: Self-pay

## 2023-10-18 ENCOUNTER — Other Ambulatory Visit (HOSPITAL_COMMUNITY): Payer: Self-pay

## 2023-10-23 ENCOUNTER — Ambulatory Visit (INDEPENDENT_AMBULATORY_CARE_PROVIDER_SITE_OTHER): Payer: Self-pay | Admitting: Nurse Practitioner

## 2023-10-23 ENCOUNTER — Encounter: Payer: Self-pay | Admitting: Nurse Practitioner

## 2023-10-23 VITALS — BP 130/80 | HR 65 | Temp 98.7°F | Ht 62.0 in | Wt 192.8 lb

## 2023-10-23 DIAGNOSIS — I10 Essential (primary) hypertension: Secondary | ICD-10-CM | POA: Diagnosis not present

## 2023-10-23 DIAGNOSIS — Z2821 Immunization not carried out because of patient refusal: Secondary | ICD-10-CM

## 2023-10-23 DIAGNOSIS — Z7689 Persons encountering health services in other specified circumstances: Secondary | ICD-10-CM | POA: Diagnosis not present

## 2023-10-23 DIAGNOSIS — R7309 Other abnormal glucose: Secondary | ICD-10-CM | POA: Diagnosis not present

## 2023-10-23 DIAGNOSIS — E782 Mixed hyperlipidemia: Secondary | ICD-10-CM

## 2023-10-23 DIAGNOSIS — E890 Postprocedural hypothyroidism: Secondary | ICD-10-CM

## 2023-10-23 DIAGNOSIS — Z6835 Body mass index (BMI) 35.0-35.9, adult: Secondary | ICD-10-CM | POA: Diagnosis not present

## 2023-10-23 DIAGNOSIS — E559 Vitamin D deficiency, unspecified: Secondary | ICD-10-CM | POA: Diagnosis not present

## 2023-10-23 DIAGNOSIS — R7303 Prediabetes: Secondary | ICD-10-CM | POA: Diagnosis not present

## 2023-10-23 DIAGNOSIS — E66812 Obesity, class 2: Secondary | ICD-10-CM

## 2023-10-23 NOTE — Assessment & Plan Note (Addendum)
 Will recheck HgbA1C today. Encouraged to focus on healthy diet and regular exercise.

## 2023-10-23 NOTE — Progress Notes (Signed)
 [place], CMA,acting as a Neurosurgeon for Susanna Epley, FNP.,have documented all relevant documentation on the behalf of Susanna Epley, FNP,as directed by  Susanna Epley, FNP while in the presence of Susanna Epley, FNP.  Subjective:  Patient ID: Tammy Duran , female    DOB: 1956-04-01 , 68 y.o.   MRN: [id number]  Chief Complaint  Patient presents with   Establish Care    HPI  Patient presents today to establish care, Patient reports compliance with medication. Patient denies any chest pain, SOB, or headaches.   Patient is a retired [employer] fund[employer], lives with husband. Has one adult son, takes care of 3 grandchildren during the week.   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    Patient has a history of hypertension, she reports when she takes her olmestartan 40mg  (she takes half a tablet) daily it makes her ankles swell, when she doesn't take it the swelling goes away. She last took the olmestartan Saturday. She has noticed this change over the past year.  Takes half tab of her BP meds and lasix  as directed by Dr Mylinda Asa.    Her ex husband has died and is being buried tomorrow, her child is an only son and so this has been stressful for her supporting her son through his fathers illness.  She was also a patient of Dr Mylinda Asa since moving to [place] in 1982, and transitioning to a new provider is also causing stress and anxiety.    Regular diet, exercises 1-2 times a week walking or stationary bike. She does not like water, and has been working on increasing it for years.  She sleeps 5-6 hours a night, does not feel refreshed, husband says that she snores.       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   Vitamin D  deficiency      Family History  Problem Relation Age of Onset   Diabetes Mother    Heart disease Mother    Hypertension Mother    Hyperlipidemia Mother    COPD Mother    Depression Father    Suicidality Father    Stroke Brother    Heart disease Brother    Asthma Son    Cancer Maternal Aunt        lung     Current Outpatient Medications:    aspirin 81 MG tablet, Take 81 mg by mouth daily., Disp: , Rfl:    atenolol  (TENORMIN ) 100 MG tablet, Take 1 tablet by mouth daily for blood pressure., Disp: 90 tablet, Rfl: 3   buPROPion  (WELLBUTRIN  XL) 150 MG 24 hr tablet, Take 1 tablet (150 mg total) by mouth every morning for mood, focus and concentration., Disp: 90 tablet, Rfl: 3   Cholecalciferol (VITAMIN D ) 125 MCG (5000 UT) CAPS, Take 1 capsule Daily, Disp: , Rfl:    cycloSPORINE  (RESTASIS ) 0.05 % ophthalmic emulsion, Place 1 drop into both eyes 2 (two) times daily., Disp: 180 each, Rfl:  3   estradiol  (VIVELLE -DOT) 0.025 MG/24HR, Place 1 patch onto the skin 2 (two) times a week., Disp: 24 patch, Rfl: 4   fexofenadine (ALLEGRA) 180 MG tablet, Take 180 mg by mouth daily. Patient OTC Allegra PRN, Disp: , Rfl:    fluticasone  (FLONASE ) 50 MCG/ACT nasal spray, PLACE 2 SPRAYS INTO BOTH NOSTRILS DAILY., Disp: 48 g, Rfl: 1   furosemide  (LASIX ) 40 MG tablet, Take 1 tablet (40 mg total) by mouth daily. For blood pressure and fluid retention., Disp: 90 tablet, Rfl: 3   levothyroxine  (SYNTHROID ) 112 MCG tablet, Take 1 tablet (112 mcg total) by mouth daily on an empty stomach with only water for 30 minutes and no antacids, calcium  or magnesium meds for 4 hours and avoid biotin, Disp: 90 tablet, Rfl: 3   Magnesium 500 MG CAPS, Take by mouth  daily., Disp: , Rfl:    olmesartan  (BENICAR ) 40 MG tablet, Take 1 tablet by mouth daily for blood pressure., Disp: 90 tablet, Rfl: 3   Omega-3 Fatty Acids (FISH OIL) 1000 MG CAPS, Take by mouth daily., Disp: , Rfl:    RESTASIS  0.05 % ophthalmic emulsion, Instill 1 drop in both eyes twice a day., Disp: 180 each, Rfl: 0   rosuvastatin  (CRESTOR ) 40 MG tablet, Take  1/2 tablet   2 x /week  for Cholesterol, Disp: 13 tablet, Rfl: 1   triamcinolone  cream (KENALOG ) 0.1 %, Apply 1 application topically 2 to 3 times daily., Disp: 80 g, Rfl: 3   vitamin C (ASCORBIC ACID) 500 MG tablet, Take 500 mg by mouth daily., Disp: , Rfl:    vitamin E 400 UNIT capsule, Take 400 Units by mouth daily., Disp: , Rfl:    Zinc 25 MG TABS, Take by mouth daily., Disp: , Rfl:    phentermine  (ADIPEX-P ) 37.5 MG tablet, Take 0.5-1 tablets (18.75-37.5 mg total) by mouth every morning for dieting & weight loss (Patient not taking: Reported on 10/23/2023), Disp: 90 tablet, Rfl: 1   topiramate  (TOPAMAX ) 50 MG tablet, Take 0.5-1 tablets (25-50 mg total) by mouth 2 (two) times daily at suppertime & bedtime for dieting & weight loss (Patient not taking: Reported on 10/23/2023), Disp: 180 tablet, Rfl: 1   Allergies  Allergen Reactions   Erythromycin Nausea And Vomiting and Other (See Comments)    Diarrhea - more severe     Review of Systems  Constitutional: Negative.   HENT: Negative.    Eyes: Negative.   Respiratory: Negative.    Cardiovascular: Negative.   Gastrointestinal: Negative.   All other systems reviewed and are negative.    Today's Vitals   10/23/23 1415 10/23/23 1539  BP: (!) 140/70 130/80  Pulse: 65   Temp: 98.7 F (37.1 C)   TempSrc: Oral   Weight: 192 lb 12.8 oz (87.5 kg)   Height: 5\' 2"  (1.575 m)   PainSc: 0-No pain    Body mass index is 35.26 kg/m.  Wt Readings from Last 3 Encounters:  10/23/23 192 lb 12.8 oz (87.5 kg)  06/03/23 185 lb 3.2 oz (84 kg)  02/28/23 185 lb 3.2 oz (84 kg)   The 10-year  ASCVD risk score (Arnett DK, et al., 2019) is: 21.2%    Objective:  Physical Exam Constitutional:      Appearance: Normal appearance. She is obese.  HENT:     Head: Normocephalic and atraumatic.     Right Ear: External ear normal.     Left Ear: External ear normal.  Eyes:  Pupils: Pupils are equal, round, and reactive to light.  Cardiovascular:     Rate and Rhythm: Normal rate and regular rhythm.     Pulses: Normal pulses.     Heart sounds: Normal heart sounds.  Pulmonary:     Effort: Pulmonary effort is normal.     Breath sounds: Normal breath sounds.  Skin:    General: Skin is warm and dry.     Capillary Refill: Capillary refill takes 2 to 3 seconds.  Neurological:     General: No focal deficit present.     Mental Status: She is alert. Mental status is at baseline. She is disoriented.  Psychiatric:        Mood and Affect: Mood normal.        Behavior: Behavior normal.        Thought Content: Thought content normal.        Judgment: Judgment normal.         Assessment And Plan:  Establishing care with new doctor, encounter for Assessment & Plan: Patient is here to establish care. Went over patient medical, family, social and surgical history. Reviewed with patient their medications and any allergies  Reviewed with patient their sexual orientation, drug/tobacco and alcohol use Dicussed any new concerns with patient  recommended patient comes in for a physical exam and complete blood work.  Educated patient about the importance of annual screenings and immunizations.  Advised patient to eat a healthy diet along with exercise for atleast 30-45 min atleast 4-5 days of the week.     Essential hypertension Assessment & Plan: Blood pressure is slightly elevated, repeat is slightly better. This is her first visit this may be the cause of the slightly elevated BP.   Orders: -     BMP8+eGFR  Hyperlipidemia, mixed Assessment & Plan: Will check lipid panel.    Orders: -     Lipid panel  Prediabetes Assessment & Plan: Will recheck HgbA1C today. Encouraged to focus on healthy diet and regular exercise.  Orders: -     Hemoglobin A1c  Vitamin D  deficiency Assessment & Plan: Will check vitamin D  level and supplement as needed.    Also encouraged to spend 15 minutes in the sun daily.     Postoperative hypothyroidism Assessment & Plan: Continue current medications. Will check thyroid  levels.   Orders: -     TSH + free T4  Herpes zoster vaccination declined Assessment & Plan: Declines shingrix, educated on disease process and is aware if he changes his mind to notify office    COVID-19 vaccination declined Assessment & Plan: Declines covid 19 vaccine. Discussed risk of covid 57 and if she changes her mind about the vaccine to call the office. Education has been provided regarding the importance of this vaccine but patient still declined. Advised may receive this vaccine at local pharmacy or Health Dept.or vaccine clinic. Aware to provide a copy of the vaccination record if obtained from local pharmacy or Health Dept.  Encouraged to take multivitamin, vitamin d , vitamin c and zinc to increase immune system. Aware can call office if would like to have vaccine here at office. Verbalized acceptance and understanding.    Class 2 severe obesity due to excess calories with serious comorbidity and body mass index (BMI) of 35.0 to 35.9 in adult [facility]) Assessment & Plan: She is encouraged to strive for BMI less than 30 to decrease cardiac risk. Advised to aim for at least 150 minutes of exercise per week.  Return in about 3 months (around 01/22/2024) for needs welcome to medicare visit .  Patient was given opportunity to ask questions. Patient verbalized understanding of the plan and was able to repeat key elements of the plan. All questions were answered to their satisfaction.   I have reviewed this encounter including the documentation  in this note and/or discussed this patient with Mickael Alamo FNP Student. I am certifying that I agree with the content of this note as the primary care nurse practitioner.  Susanna Epley, DNP, FNP-BC  I, Susanna Epley, FNP, have reviewed all documentation for this visit. The documentation on 10/23/23 for the exam, diagnosis, procedures, and orders are all accurate and complete.   IF YOU HAVE BEEN REFERRED TO A SPECIALIST, IT MAY TAKE 1-2 WEEKS TO SCHEDULE/PROCESS THE REFERRAL. IF YOU HAVE NOT HEARD FROM US /SPECIALIST IN TWO WEEKS, PLEASE GIVE US  A CALL AT [phone] X [phone].

## 2023-10-23 NOTE — Patient Instructions (Signed)
 Use tylenol, not ibuprophen, for pain relief.  Ibuprophen can cause increased BP  Watch for hidden salts in drinks, sauces, and dressings.

## 2023-10-24 LAB — LIPID PANEL
Chol/HDL Ratio: 3 ratio (ref 0.0–4.4)
Cholesterol, Total: 222 mg/dL — ABNORMAL HIGH (ref 100–199)
HDL: 74 mg/dL (ref >39)
LDL Chol Calc (NIH): 131 mg/dL — ABNORMAL HIGH (ref 0–99)
Triglycerides: 100 mg/dL (ref 0–149)
VLDL Cholesterol Cal: 17 mg/dL (ref 5–40)

## 2023-10-24 LAB — BMP8+EGFR
BUN/Creatinine Ratio: 11 — ABNORMAL LOW (ref 12–28)
BUN: 12 mg/dL (ref 8–27)
CO2: 26 mmol/L (ref 20–29)
Calcium: 9.1 mg/dL (ref 8.7–10.3)
Chloride: 103 mmol/L (ref 96–106)
Creatinine, Ser: 1.11 mg/dL — ABNORMAL HIGH (ref 0.57–1.00)
Glucose: 87 mg/dL (ref 70–99)
Potassium: 4.8 mmol/L (ref 3.5–5.2)
Sodium: 143 mmol/L (ref 134–144)
eGFR: 55 mL/min/{1.73_m2} — ABNORMAL LOW (ref >59)

## 2023-10-24 LAB — TSH+FREE T4
Free T4: 1.52 ng/dL (ref 0.82–1.77)
TSH: 2.1 u[IU]/mL (ref 0.450–4.500)

## 2023-10-24 LAB — HEMOGLOBIN A1C
Est. average glucose Bld gHb Est-mCnc: 117 mg/dL
Hgb A1c MFr Bld: 5.7 % — ABNORMAL HIGH (ref 4.8–5.6)

## 2023-11-02 ENCOUNTER — Encounter: Payer: Self-pay | Admitting: Nurse Practitioner

## 2023-11-02 DIAGNOSIS — Z2821 Immunization not carried out because of patient refusal: Secondary | ICD-10-CM | POA: Insufficient documentation

## 2023-11-02 DIAGNOSIS — E66812 Obesity, class 2: Secondary | ICD-10-CM | POA: Insufficient documentation

## 2023-11-02 DIAGNOSIS — Z7689 Persons encountering health services in other specified circumstances: Secondary | ICD-10-CM | POA: Insufficient documentation

## 2023-11-02 NOTE — Assessment & Plan Note (Signed)

## 2023-11-02 NOTE — Assessment & Plan Note (Signed)
 Will check vitamin D level and supplement as needed.    Also encouraged to spend 15 minutes in the sun daily.

## 2023-11-02 NOTE — Assessment & Plan Note (Signed)
 Declines shingrix, educated on disease process and is aware if he changes his mind to notify office

## 2023-11-02 NOTE — Assessment & Plan Note (Signed)

## 2023-11-02 NOTE — Assessment & Plan Note (Signed)
 Will check lipid panel

## 2023-11-02 NOTE — Assessment & Plan Note (Signed)
 Blood pressure is slightly elevated, repeat is slightly better. This is her first visit this may be the cause of the slightly elevated BP.

## 2023-11-02 NOTE — Assessment & Plan Note (Signed)
 She is encouraged to strive for BMI less than 30 to decrease cardiac risk. Advised to aim for at least 150 minutes of exercise per week.

## 2023-11-02 NOTE — Assessment & Plan Note (Signed)
 Continue current medications. Will check thyroid  levels.

## 2023-11-30 ENCOUNTER — Other Ambulatory Visit: Payer: Self-pay

## 2023-12-02 ENCOUNTER — Other Ambulatory Visit: Payer: Self-pay | Admitting: Internal Medicine

## 2023-12-02 ENCOUNTER — Other Ambulatory Visit: Payer: Self-pay

## 2023-12-02 ENCOUNTER — Other Ambulatory Visit (HOSPITAL_COMMUNITY): Payer: Self-pay

## 2023-12-03 ENCOUNTER — Other Ambulatory Visit (HOSPITAL_COMMUNITY): Payer: Self-pay

## 2023-12-04 ENCOUNTER — Other Ambulatory Visit (HOSPITAL_COMMUNITY): Payer: Self-pay

## 2023-12-04 ENCOUNTER — Other Ambulatory Visit: Payer: Self-pay

## 2023-12-04 ENCOUNTER — Ambulatory Visit: Payer: Federal, State, Local not specified - PPO | Admitting: Nurse Practitioner

## 2023-12-05 ENCOUNTER — Other Ambulatory Visit: Payer: Self-pay

## 2023-12-05 ENCOUNTER — Encounter: Payer: Self-pay | Admitting: Nurse Practitioner

## 2023-12-05 ENCOUNTER — Other Ambulatory Visit (HOSPITAL_COMMUNITY): Payer: Self-pay

## 2023-12-05 ENCOUNTER — Other Ambulatory Visit: Payer: Self-pay | Admitting: Nurse Practitioner

## 2023-12-05 MED ORDER — BUPROPION HCL ER (XL) 150 MG PO TB24
150.0000 mg | ORAL_TABLET | Freq: Every morning | ORAL | 3 refills | Status: DC
  Filled 2023-12-05: qty 90, 90d supply, fill #0

## 2023-12-05 MED ORDER — FUROSEMIDE 40 MG PO TABS
40.0000 mg | ORAL_TABLET | Freq: Every day | ORAL | 3 refills | Status: AC
  Filled 2023-12-05: qty 90, 90d supply, fill #0
  Filled 2024-04-05: qty 90, 90d supply, fill #1
  Filled 2024-06-30: qty 90, 90d supply, fill #2

## 2023-12-30 DIAGNOSIS — H16223 Keratoconjunctivitis sicca, not specified as Sjogren's, bilateral: Secondary | ICD-10-CM | POA: Diagnosis not present

## 2023-12-30 DIAGNOSIS — H25813 Combined forms of age-related cataract, bilateral: Secondary | ICD-10-CM | POA: Diagnosis not present

## 2023-12-30 DIAGNOSIS — R7303 Prediabetes: Secondary | ICD-10-CM | POA: Diagnosis not present

## 2023-12-30 DIAGNOSIS — H524 Presbyopia: Secondary | ICD-10-CM | POA: Diagnosis not present

## 2023-12-30 DIAGNOSIS — H43813 Vitreous degeneration, bilateral: Secondary | ICD-10-CM | POA: Diagnosis not present

## 2023-12-30 DIAGNOSIS — H5203 Hypermetropia, bilateral: Secondary | ICD-10-CM | POA: Diagnosis not present

## 2024-02-11 ENCOUNTER — Ambulatory Visit (INDEPENDENT_AMBULATORY_CARE_PROVIDER_SITE_OTHER): Admitting: Nurse Practitioner

## 2024-02-11 ENCOUNTER — Encounter: Payer: Self-pay | Admitting: Nurse Practitioner

## 2024-02-11 ENCOUNTER — Other Ambulatory Visit (HOSPITAL_COMMUNITY): Payer: Self-pay

## 2024-02-11 ENCOUNTER — Other Ambulatory Visit: Payer: Self-pay

## 2024-02-11 VITALS — BP 128/60 | HR 59 | Temp 98.5°F | Ht 62.0 in | Wt 197.0 lb

## 2024-02-11 DIAGNOSIS — E782 Mixed hyperlipidemia: Secondary | ICD-10-CM | POA: Diagnosis not present

## 2024-02-11 DIAGNOSIS — E66812 Obesity, class 2: Secondary | ICD-10-CM

## 2024-02-11 DIAGNOSIS — Z0000 Encounter for general adult medical examination without abnormal findings: Secondary | ICD-10-CM

## 2024-02-11 DIAGNOSIS — E890 Postprocedural hypothyroidism: Secondary | ICD-10-CM

## 2024-02-11 DIAGNOSIS — Z139 Encounter for screening, unspecified: Secondary | ICD-10-CM | POA: Diagnosis not present

## 2024-02-11 DIAGNOSIS — R7303 Prediabetes: Secondary | ICD-10-CM

## 2024-02-11 DIAGNOSIS — I10 Essential (primary) hypertension: Secondary | ICD-10-CM | POA: Diagnosis not present

## 2024-02-11 DIAGNOSIS — E661 Drug-induced obesity: Secondary | ICD-10-CM | POA: Diagnosis not present

## 2024-02-11 DIAGNOSIS — Z2821 Immunization not carried out because of patient refusal: Secondary | ICD-10-CM

## 2024-02-11 DIAGNOSIS — Z8659 Personal history of other mental and behavioral disorders: Secondary | ICD-10-CM | POA: Diagnosis not present

## 2024-02-11 DIAGNOSIS — Z6836 Body mass index (BMI) 36.0-36.9, adult: Secondary | ICD-10-CM

## 2024-02-11 DIAGNOSIS — Z79899 Other long term (current) drug therapy: Secondary | ICD-10-CM | POA: Diagnosis not present

## 2024-02-11 DIAGNOSIS — E559 Vitamin D deficiency, unspecified: Secondary | ICD-10-CM

## 2024-02-11 LAB — POCT URINALYSIS DIP (CLINITEK)
Bilirubin, UA: NEGATIVE
Blood, UA: NEGATIVE
Glucose, UA: NEGATIVE mg/dL
Ketones, POC UA: NEGATIVE mg/dL
Leukocytes, UA: NEGATIVE
Nitrite, UA: NEGATIVE
POC PROTEIN,UA: NEGATIVE
Spec Grav, UA: 1.01 (ref 1.010–1.025)
Urobilinogen, UA: 0.2 U/dL
pH, UA: 6 (ref 5.0–8.0)

## 2024-02-11 MED ORDER — BUPROPION HCL ER (XL) 300 MG PO TB24
300.0000 mg | ORAL_TABLET | ORAL | 2 refills | Status: DC
  Filled 2024-02-11: qty 30, 30d supply, fill #0

## 2024-02-11 MED ORDER — OLMESARTAN MEDOXOMIL 20 MG PO TABS
20.0000 mg | ORAL_TABLET | Freq: Every day | ORAL | 2 refills | Status: AC
  Filled 2024-02-11: qty 90, 90d supply, fill #0
  Filled 2024-05-21: qty 90, 90d supply, fill #1
  Filled 2024-08-20: qty 90, 90d supply, fill #2

## 2024-02-11 MED ORDER — BUPROPION HCL ER (XL) 300 MG PO TB24
300.0000 mg | ORAL_TABLET | ORAL | 2 refills | Status: AC
  Filled 2024-02-11: qty 90, 90d supply, fill #0
  Filled 2024-05-21: qty 90, 90d supply, fill #1
  Filled 2024-08-20: qty 90, 90d supply, fill #2

## 2024-02-11 NOTE — Assessment & Plan Note (Addendum)
 Blood pressure well controlled, continue current medications. EKG done with SR with bradycardia HR 58

## 2024-02-11 NOTE — Progress Notes (Signed)
 Subjective:   Tammy Duran is a 68 y.o. female who presents for an Initial Medicare Annual Wellness Visit.  Visit Complete: In person  Patient Medicare AWV questionnaire was completed by the patient on 02/11/24; I have confirmed that all information answered by patient is correct and no changes since this date.  She has a membership at the gym but does not go regularly. She will walk a mile on Youtube. She admits she enjoys food.   She is taking 112 mcg of levothyroxine  on MWF and 1/2 tab TThSaSu.  She has brittle nails and tearing. Papillary thyroid  cancer.     Objective:    Today's Vitals   02/11/24 0843  BP: 128/60  Pulse: (!) 59  Temp: 98.5 F (36.9 C)  TempSrc: Oral  Weight: 197 lb (89.4 kg)  Height: 5' 2 (1.575 m)  PainSc: 0-No pain   Body mass index is 36.03 kg/m. Wt Readings from Last 3 Encounters:  02/11/24 197 lb (89.4 kg)  10/23/23 192 lb 12.8 oz (87.5 kg)  06/03/23 185 lb 3.2 oz (84 kg)       02/11/2024    9:25 AM  Advanced Directives  Does Patient Have a Medical Advance Directive? Yes    Current Medications (verified) Outpatient Encounter Medications as of 02/11/2024  Medication Sig   aspirin 81 MG tablet Take 81 mg by mouth daily.   atenolol  (TENORMIN ) 100 MG tablet Take 1 tablet by mouth daily for blood pressure.   Cholecalciferol (VITAMIN D ) 125 MCG (5000 UT) CAPS Take 1 capsule Daily   cycloSPORINE  (RESTASIS ) 0.05 % ophthalmic emulsion Place 1 drop into both eyes 2 (two) times daily.   estradiol  (VIVELLE -DOT) 0.025 MG/24HR Place 1 patch onto the skin 2 (two) times a week.   fexofenadine (ALLEGRA) 180 MG tablet Take 180 mg by mouth daily. Patient OTC Allegra PRN   fluticasone  (FLONASE ) 50 MCG/ACT nasal spray PLACE 2 SPRAYS INTO BOTH NOSTRILS DAILY.   furosemide  (LASIX ) 40 MG tablet Take 1 tablet (40 mg total) by mouth daily. For blood pressure and fluid retention.   levothyroxine  (SYNTHROID ) 112 MCG tablet Take 1 tablet (112 mcg total) by mouth  daily on an empty stomach with only water for 30 minutes and no antacids, calcium  or magnesium meds for 4 hours and avoid biotin   Magnesium 500 MG CAPS Take by mouth daily.   olmesartan  (BENICAR ) 20 MG tablet Take 1 tablet (20 mg total) by mouth daily. For blood pressure   Omega-3 Fatty Acids (FISH OIL) 1000 MG CAPS Take by mouth daily.   RESTASIS  0.05 % ophthalmic emulsion Instill 1 drop in both eyes twice a day.   rosuvastatin  (CRESTOR ) 40 MG tablet Take  1/2 tablet   2 x /week  for Cholesterol   triamcinolone  cream (KENALOG ) 0.1 % Apply 1 application topically 2 to 3 times daily.   vitamin C (ASCORBIC ACID) 500 MG tablet Take 500 mg by mouth daily.   vitamin E 400 UNIT capsule Take 400 Units by mouth daily.   Zinc 25 MG TABS Take by mouth daily.   [DISCONTINUED] buPROPion  (WELLBUTRIN  XL) 150 MG 24 hr tablet Take 1 tablet (150 mg total) by mouth every morning for mood, focus and concentration.   [DISCONTINUED] buPROPion  (WELLBUTRIN  XL) 300 MG 24 hr tablet Take 1 tablet (300 mg total) by mouth every morning. for mood, focus and concentration   [DISCONTINUED] olmesartan  (BENICAR ) 40 MG tablet Take 1 tablet by mouth daily for blood pressure.  buPROPion  (WELLBUTRIN  XL) 300 MG 24 hr tablet Take 1 tablet (300 mg total) by mouth every morning. for mood, focus and concentration   phentermine  (ADIPEX-P ) 37.5 MG tablet Take 0.5-1 tablets (18.75-37.5 mg total) by mouth every morning for dieting & weight loss (Patient not taking: Reported on 02/11/2024)   topiramate  (TOPAMAX ) 50 MG tablet Take 0.5-1 tablets (25-50 mg total) by mouth 2 (two) times daily at suppertime & bedtime for dieting & weight loss (Patient not taking: Reported on 02/11/2024)   No facility-administered encounter medications on file as of 02/11/2024.    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   Vitamin D  deficiency    Past Surgical History:  Procedure Laterality Date   ABDOMINAL HYSTERECTOMY  2005   THYROIDECTOMY  04/25/11   Family History  Problem Relation Age of Onset   Diabetes Mother    Heart disease Mother    Hypertension Mother    Hyperlipidemia Mother    COPD Mother    Depression Father    Suicidality Father    Stroke Brother    Heart disease Brother    Asthma Son    Cancer Maternal Aunt        lung   Social History   Socioeconomic History   Marital status: Married    Spouse name: Not on file   Number of children: 1   Years of education: Nursing   Highest education level: Not on file  Occupational History    Employer: [employer]  Tobacco Use   Smoking status: Light Smoker    Current packs/day: 0.20    Types: Cigarettes   Smokeless tobacco: Never   Tobacco comments:    occas  Vaping Use   Vaping status: Never Used  Substance and Sexual Activity   Alcohol use: Yes    Comment: occas   Drug use: No   Sexual activity: Yes    Birth control/protection: None  Other Topics Concern   Not on file  Social History Narrative   Patient lives at home alone.    Patient is divorced.    Patient has 1 child.    Patient has a Advertising copywriter in nursing.    Patient is right handed.    Social Drivers of Corporate investment banker Strain: Low Risk  (02/11/2024)   Overall Financial Resource Strain (CARDIA)    Difficulty of Paying Living Expenses: Not hard at all  Food Insecurity: No Food Insecurity (02/11/2024)   Hunger Vital Sign    Worried About Running Out of Food in the Last Year: Never true    Ran Out of Food in the Last Year: Never true  Transportation Needs: No Transportation Needs (02/11/2024)   PRAPARE - Administrator, Civil Service (Medical): No    Lack  of Transportation (Non-Medical): No  Physical Activity: Inactive (02/11/2024)   Exercise Vital Sign    Days of Exercise per Week: 0 days    Minutes of Exercise per Session: 0 min  Stress: No Stress Concern Present (02/11/2024)   [employer] of Occupational Health - Occupational Stress Questionnaire    Feeling of Stress:  Only a little  Social Connections: Moderately Integrated (02/11/2024)   Social Connection and Isolation Panel    Frequency of Communication with Friends and Family: More than three times a week    Frequency of Social Gatherings with Friends and Family: More than three times a week    Attends Religious Services: More than 4 times per year    Active Member of [employer] or Organizations: No    Attends Banker Meetings: Never    Marital Status: Married    Tobacco Counseling Ready to quit: Not Answered Counseling given: Not Answered Tobacco comments: occas   Clinical Intake:   Pain Score: 0-No pain   Activities of Daily Living    02/11/2024    8:36 AM  In your present state of health, do you have any difficulty performing the following activities:  Hearing? 0  Vision? 1  Difficulty concentrating or making decisions? 1  Walking or climbing stairs? 0  Dressing or bathing? 0  Doing errands, shopping? 0    Patient Care Team: Georgina Speaks, FNP as PCP - General (General Practice)  Indicate any recent Medical Services you may have received from other than [facility] providers in the past year (date may be approximate).     Assessment:   This is a routine wellness examination for Tammy Duran.  Hearing/Vision screen Hearing Screening   500Hz  1000Hz  2000Hz  4000Hz   Right ear Pass Pass Pass Pass  Left ear Pass Pass Pass Pass   Vision Screening   Right eye Left eye Both eyes  Without correction     With correction 20/50 20/50 20/23      Goals Addressed               This Visit's Progress     Patient Stated (pt-stated)        Lose 20 pounds.         Depression Screen    02/11/2024    8:34 AM 10/23/2023    2:23 PM 02/28/2023    1:10 AM 11/26/2022    1:17 AM 02/06/2022   11:05 PM 07/22/2021    7:57 PM 01/17/2021   11:34 PM  PHQ 2/9 Scores  PHQ - 2 Score 0 0 0 0 0 0 0  PHQ- 9 Score  0         Fall Risk    10/23/2023    2:23 PM 02/28/2023    1:09 AM 11/26/2022    1:17 AM 02/06/2022   11:05 PM 07/22/2021    7:57 PM  Fall Risk   Falls in the past year? 0 0 0  0  Number falls in past yr: 0      Injury with Fall? 0      Risk for fall due to : No Fall Risks No Fall Risks No Fall Risks No Fall Risks No Fall Risks  Follow up Falls evaluation completed Education provided;Falls prevention discussed;Falls evaluation completed Falls evaluation completed;Education provided;Falls prevention discussed Falls evaluation completed;Education provided;Falls prevention discussed  Falls evaluation completed;Education provided;Falls prevention discussed      Data saved with a previous flowsheet row definition    MEDICARE RISK AT HOME:    TIMED UP AND GO:  Was the test performed? Yes  Length of time to ambulate 10 feet: 5 sec Gait steady and fast without use of assistive device    Cognitive Function:        02/11/2024    8:35 AM  6CIT Screen  What Year? 0 points  What  month? 0 points  What time? 0 points  Count back from 20 0 points  Months in reverse 0 points  Repeat phrase 0 points  Total Score 0 points    Immunizations Immunization History  Administered Date(s) Administered   DTaP 12/14/1994   Hepatitis B 07/15/1989   Influenza, High Dose Seasonal PF 06/03/2023   Influenza-Unspecified 04/29/2017   PFIZER(Purple Top)SARS-COV-2 Vaccination 07/05/2019, 07/23/2019   PPD Test 07/18/2014, 08/04/2015, 10/05/2018, 11/22/2019, 01/18/2021   Pneumococcal Polysaccharide-23 10/09/1998   Tdap 05/03/2015    TDAP status: Up to date  Flu Vaccine status: Up to date  Pneumococcal vaccine status: Declined,  Education has been provided  regarding the importance of this vaccine but patient still declined. Advised may receive this vaccine at local pharmacy or Health Dept. Aware to provide a copy of the vaccination record if obtained from local pharmacy or Health Dept. Verbalized acceptance and understanding.   Covid-19 vaccine status: Declined, Education has been provided regarding the importance of this vaccine but patient still declined. Advised may receive this vaccine at local pharmacy or Health Dept.or vaccine clinic. Aware to provide a copy of the vaccination record if obtained from local pharmacy or Health Dept. Verbalized acceptance and understanding.  Qualifies for Shingles Vaccine? Yes   Zostavax completed No   Shingrix Completed?: No.    Education has been provided regarding the importance of this vaccine. Patient has been advised to call insurance company to determine out of pocket expense if they have not yet received this vaccine. Advised may also receive vaccine at local pharmacy or Health Dept. Verbalized acceptance and understanding.  Screening Tests Health Maintenance  Topic Date Due   COVID-19 Vaccine (3 - Pfizer risk series) 08/20/2019   INFLUENZA VACCINE  02/13/2024   Zoster Vaccines- Shingrix (1 of 2) 05/13/2024 (Originally 10/25/1974)   Pneumococcal Vaccine: 50+ Years (2 of 2 - PCV) 10/22/2024 (Originally 10/09/1999)   Medicare Annual Wellness (AWV)  02/10/2025   DTaP/Tdap/Td (3 - Td or Tdap) 05/02/2025   MAMMOGRAM  10/01/2025   Colonoscopy  08/12/2027   DEXA SCAN  Completed   Hepatitis C Screening  Completed   HPV VACCINES  Aged Out   Meningococcal B Vaccine  Aged Out   Pneumococcal Vaccine  Discontinued   Hepatitis B Vaccines 19-59 Average Risk  Discontinued    Health Maintenance  Health Maintenance Due  Topic Date Due   COVID-19 Vaccine (3 - Pfizer risk series) 08/20/2019   INFLUENZA VACCINE  02/13/2024    Colorectal cancer screening: Type of screening: Colonoscopy. Completed 08/11/2017.  Repeat every 10 years  Mammogram status: Completed 10/02/23. Repeat every year  Bone Density status: Completed 04/12/2013. Results reflect: Bone density results: NORMAL. Repeat every 10 years.  Lung Cancer Screening: (Low Dose CT Chest recommended if Age 41-80 years, 20 pack-year currently smoking OR have quit w/in 15years.) does qualify.   Lung Cancer Screening Referral:   Additional Screening:  Hepatitis C Screening: does qualify; Completed 07/14/2013  Vision Screening: Recommended annual ophthalmology exams for early detection of glaucoma and other disorders of the eye. Is the patient up to date with their annual eye exam?  Yes  Who is the provider or what is the name of the office in which the patient attends annual eye exams? [facility] If pt is not established with a provider, would they like to be referred to a provider to establish care? established.   Dental Screening: Recommended annual dental exams for proper oral hygiene  Diabetic Foot Exam: Pre  DM  Community Resource Referral / Chronic Care Management: CRR required this visit?  No   CCM required this visit?  No     Plan:  Pt's annual wellness exam was performed and geriatric assessment reviewed.  Pt has no new identiafble wellness concerns at this time.  WIll obtain routine labs.  Will obtain UA and micro.  Behavior modifications discussed and diet history reviewed. Pt will continue to exercise regularly and modify diet, with low GI, plant based foods and decrease food intake of processed foods.  Recommend intake of daily multivitamin, Vitamin D , and calcium . Recommond mammogram and colonoscopy for preventive screenings, as well as recommend immunizations that include TDAP  Problem List Items Addressed This Visit       Cardiovascular and Mediastinum   Essential hypertension   Blood pressure well controlled, continue current medications. EKG done with SR with bradycardia HR 58      Relevant Medications    olmesartan  (BENICAR ) 20 MG tablet   Other Relevant Orders   EKG 12-Lead (Completed)   Microalbumin / creatinine urine ratio (Completed)   POCT URINALYSIS DIP (CLINITEK) (Completed)   CMP14+EGFR (Completed)     Endocrine   Hypothyroidism   Continue current medications pending lab results, she is taking levothyroxine  112 mcg MWF, TThSa and Sunday 1/2 tab. Will check thyroid  levels.       Relevant Orders   TSH + free T4 (Completed)     Other   Hyperlipidemia, mixed   Will check lipid panel.       Relevant Medications   olmesartan  (BENICAR ) 20 MG tablet   Other Relevant Orders   Lipid panel (Completed)   Prediabetes   Stable, will check A1c       Relevant Orders   Hemoglobin A1c (Completed)   Vitamin D  deficiency   Will check vitamin D  level and supplement as needed.    Also encouraged to spend 15 minutes in the sun daily.        Relevant Orders   VITAMIN D  25 Hydroxy (Vit-D Deficiency, Fractures) (Completed)   COVID-19 vaccination declined   Declines covid 19 vaccine. Discussed risk of covid 68 and if she changes her mind about the vaccine to call the office. Education has been provided regarding the importance of this vaccine but patient still declined. Advised may receive this vaccine at local pharmacy or Health Dept.or vaccine clinic. Aware to provide a copy of the vaccination record if obtained from local pharmacy or Health Dept.  Encouraged to take multivitamin, vitamin d , vitamin c and zinc to increase immune system. Aware can call office if would like to have vaccine here at office. Verbalized acceptance and understanding.       Herpes zoster vaccination declined   Declines shingrix, educated on disease process and is aware if he changes his mind to notify office       Encounter for Medicare annual wellness exam - Primary   Pt's annual wellness exam was performed and geriatric assessment reviewed.  Pt has no new identiafble wellness concerns at this time.  WIll  obtain routine labs.  Will obtain UA and micro.  Behavior modifications discussed and diet history reviewed. Pt will continue to exercise regularly and modify diet, with low GI, plant based foods and decrease food intake of processed foods.  Recommend intake of daily multivitamin, Vitamin D , and calcium . 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 Advised to aim for at least 150 minutes of exercise per week.       Relevant Medications   buPROPion  (WELLBUTRIN  XL) 300 MG 24 hr tablet   History of anxiety   Relevant Medications   buPROPion  (WELLBUTRIN  XL) 300 MG 24 hr tablet   Other Visit Diagnoses       Encounter for screening       Relevant Orders   Hepatitis B Surface Antibody (Completed)     Other long term (current) drug therapy       Relevant Orders   CBC with Differential/Platelet (Completed)        I have personally reviewed and noted the following in the patient's chart:   Medical and social history Use of alcohol, tobacco or illicit drugs  Current medications and supplements including opioid prescriptions. Patient is not currently taking opioid prescriptions. Functional ability and status Nutritional status Physical activity Advanced directives List of other physicians Hospitalizations, surgeries, and ER visits in previous 12 months Vitals Screenings to include cognitive, depression, and falls Referrals and appointments  In addition, I have reviewed and discussed with patient certain preventive protocols, quality metrics, and best practice  recommendations. A written personalized care plan for preventive services as well as general preventive health recommendations were provided to patient.     Gaines Ada, FNP   03/01/2024   After Visit Summary: (In Person-Printed) AVS printed and given to the patient  Nurse Notes:

## 2024-02-11 NOTE — Assessment & Plan Note (Signed)
 Will check lipid panel

## 2024-02-11 NOTE — Assessment & Plan Note (Signed)
 Continue current medications pending lab results, she is taking levothyroxine  112 mcg MWF, TThSa and Sunday 1/2 tab. Will check thyroid  levels.

## 2024-02-12 LAB — CBC WITH DIFFERENTIAL/PLATELET
Basophils Absolute: 0 x10E3/uL (ref 0.0–0.2)
Basos: 1 %
EOS (ABSOLUTE): 0.1 x10E3/uL (ref 0.0–0.4)
Eos: 3 %
Hematocrit: 43 % (ref 34.0–46.6)
Hemoglobin: 14.3 g/dL (ref 11.1–15.9)
Immature Grans (Abs): 0 x10E3/uL (ref 0.0–0.1)
Immature Granulocytes: 0 %
Lymphocytes Absolute: 1.3 x10E3/uL (ref 0.7–3.1)
Lymphs: 26 %
MCH: 31.8 pg (ref 26.6–33.0)
MCHC: 33.3 g/dL (ref 31.5–35.7)
MCV: 96 fL (ref 79–97)
Monocytes Absolute: 0.4 x10E3/uL (ref 0.1–0.9)
Monocytes: 8 %
Neutrophils Absolute: 3.3 x10E3/uL (ref 1.4–7.0)
Neutrophils: 62 %
Platelets: 259 x10E3/uL (ref 150–450)
RBC: 4.49 x10E6/uL (ref 3.77–5.28)
RDW: 13.1 % (ref 11.7–15.4)
WBC: 5.2 x10E3/uL (ref 3.4–10.8)

## 2024-02-12 LAB — LIPID PANEL
Chol/HDL Ratio: 2.5 ratio (ref 0.0–4.4)
Cholesterol, Total: 184 mg/dL (ref 100–199)
HDL: 74 mg/dL (ref >39)
LDL Chol Calc (NIH): 97 mg/dL (ref 0–99)
Triglycerides: 71 mg/dL (ref 0–149)
VLDL Cholesterol Cal: 13 mg/dL (ref 5–40)

## 2024-02-12 LAB — TSH+FREE T4
Free T4: 1.29 ng/dL (ref 0.82–1.77)
TSH: 4.1 u[IU]/mL (ref 0.450–4.500)

## 2024-02-12 LAB — CMP14+EGFR
ALT: 22 IU/L (ref 0–32)
AST: 17 IU/L (ref 0–40)
Albumin: 4.3 g/dL (ref 3.9–4.9)
Alkaline Phosphatase: 99 IU/L (ref 44–121)
BUN/Creatinine Ratio: 12 (ref 12–28)
BUN: 13 mg/dL (ref 8–27)
Bilirubin Total: 0.2 mg/dL (ref 0.0–1.2)
CO2: 25 mmol/L (ref 20–29)
Calcium: 9.1 mg/dL (ref 8.7–10.3)
Chloride: 98 mmol/L (ref 96–106)
Creatinine, Ser: 1.07 mg/dL — ABNORMAL HIGH (ref 0.57–1.00)
Globulin, Total: 2.7 g/dL (ref 1.5–4.5)
Glucose: 90 mg/dL (ref 70–99)
Potassium: 4.3 mmol/L (ref 3.5–5.2)
Sodium: 139 mmol/L (ref 134–144)
Total Protein: 7 g/dL (ref 6.0–8.5)
eGFR: 57 mL/min/1.73 — ABNORMAL LOW (ref >59)

## 2024-02-12 LAB — VITAMIN D 25 HYDROXY (VIT D DEFICIENCY, FRACTURES): Vit D, 25-Hydroxy: 80.2 ng/mL (ref 30.0–100.0)

## 2024-02-12 LAB — MICROALBUMIN / CREATININE URINE RATIO
Creatinine, Urine: 22.4 mg/dL
Microalb/Creat Ratio: <13 mg/g{creat} (ref 0–29)
Microalbumin, Urine: <3 ug/mL

## 2024-02-12 LAB — HEPATITIS B SURFACE ANTIBODY,QUALITATIVE

## 2024-02-12 LAB — HEMOGLOBIN A1C
Est. average glucose Bld gHb Est-mCnc: 126 mg/dL
Hgb A1c MFr Bld: 6 % — ABNORMAL HIGH (ref 4.8–5.6)

## 2024-02-27 ENCOUNTER — Other Ambulatory Visit: Payer: Self-pay

## 2024-02-27 ENCOUNTER — Other Ambulatory Visit: Payer: Self-pay | Admitting: Internal Medicine

## 2024-02-27 ENCOUNTER — Other Ambulatory Visit: Payer: Self-pay | Admitting: Nurse Practitioner

## 2024-02-27 ENCOUNTER — Other Ambulatory Visit (HOSPITAL_COMMUNITY): Payer: Self-pay

## 2024-02-27 DIAGNOSIS — I10 Essential (primary) hypertension: Secondary | ICD-10-CM

## 2024-03-01 ENCOUNTER — Other Ambulatory Visit (HOSPITAL_COMMUNITY): Payer: Self-pay

## 2024-03-01 ENCOUNTER — Encounter (HOSPITAL_COMMUNITY): Payer: Self-pay

## 2024-03-01 NOTE — Assessment & Plan Note (Signed)
Pt's annual wellness exam was performed and geriatric assessment reviewed.  Pt has no new identiafble wellness concerns at this time.  WIll obtain routine labs.  Will obtain UA and micro.  Behavior modifications discussed and diet history reviewed. Pt will continue to exercise regularly and modify diet, with low GI, plant based foods and decrease food intake of processed foods.  Recommend intake of daily multivitamin, Vitamin D, and calcium. Recommend mammogram and colonoscopy for preventive screenings, as well as recommend immunizations that include influenza (up to date) and TDAP

## 2024-03-01 NOTE — Assessment & Plan Note (Signed)

## 2024-03-01 NOTE — Assessment & Plan Note (Signed)
 Declines shingrix , educated on disease process and is aware if he changes his mind to notify office

## 2024-03-01 NOTE — Assessment & Plan Note (Signed)
 Stable, will check A1c

## 2024-03-01 NOTE — Assessment & Plan Note (Signed)
 She is encouraged to strive for BMI less than 30 to decrease cardiac risk. Advised to aim for at least 150 minutes of exercise per week.

## 2024-03-01 NOTE — Assessment & Plan Note (Signed)
 Will check vitamin D  level and supplement as needed.    Also encouraged to spend 15 minutes in the sun daily.

## 2024-03-09 ENCOUNTER — Encounter: Payer: Federal, State, Local not specified - PPO | Admitting: Internal Medicine

## 2024-03-17 ENCOUNTER — Other Ambulatory Visit: Payer: Self-pay

## 2024-04-01 ENCOUNTER — Encounter: Payer: Self-pay | Admitting: Nurse Practitioner

## 2024-04-02 DIAGNOSIS — M25561 Pain in right knee: Secondary | ICD-10-CM | POA: Diagnosis not present

## 2024-04-05 ENCOUNTER — Other Ambulatory Visit (HOSPITAL_COMMUNITY): Payer: Self-pay

## 2024-05-01 ENCOUNTER — Other Ambulatory Visit (HOSPITAL_COMMUNITY): Payer: Self-pay

## 2024-05-21 ENCOUNTER — Other Ambulatory Visit (HOSPITAL_COMMUNITY): Payer: Self-pay

## 2024-06-18 ENCOUNTER — Other Ambulatory Visit: Payer: Self-pay

## 2024-06-30 ENCOUNTER — Other Ambulatory Visit (HOSPITAL_COMMUNITY): Payer: Self-pay

## 2024-08-02 ENCOUNTER — Other Ambulatory Visit (HOSPITAL_COMMUNITY): Payer: Self-pay

## 2024-08-16 ENCOUNTER — Ambulatory Visit: Payer: Self-pay | Admitting: Nurse Practitioner

## 2024-08-20 ENCOUNTER — Other Ambulatory Visit: Payer: Self-pay

## 2024-09-07 ENCOUNTER — Ambulatory Visit: Payer: PRIVATE HEALTH INSURANCE | Admitting: Nurse Practitioner

## 2025-03-23 ENCOUNTER — Ambulatory Visit

## 2025-03-23 ENCOUNTER — Ambulatory Visit: Payer: Self-pay
